# Patient Record
Sex: Male | Born: 1955
Health system: Southern US, Community
[De-identification: ages and names within clinical notes are randomized; demographics above are authoritative.]

## PROBLEM LIST (undated history)

## (undated) DIAGNOSIS — M1990 Unspecified osteoarthritis, unspecified site: Secondary | ICD-10-CM

## (undated) DIAGNOSIS — E291 Testicular hypofunction: Secondary | ICD-10-CM

## (undated) DIAGNOSIS — K219 Gastro-esophageal reflux disease without esophagitis: Secondary | ICD-10-CM

## (undated) DIAGNOSIS — E119 Type 2 diabetes mellitus without complications: Secondary | ICD-10-CM

## (undated) DIAGNOSIS — T4145XA Adverse effect of unspecified anesthetic, initial encounter: Secondary | ICD-10-CM

## (undated) DIAGNOSIS — R7303 Prediabetes: Secondary | ICD-10-CM

## (undated) DIAGNOSIS — I10 Essential (primary) hypertension: Secondary | ICD-10-CM

## (undated) DIAGNOSIS — F329 Major depressive disorder, single episode, unspecified: Secondary | ICD-10-CM

## (undated) DIAGNOSIS — E039 Hypothyroidism, unspecified: Secondary | ICD-10-CM

## (undated) DIAGNOSIS — J449 Chronic obstructive pulmonary disease, unspecified: Secondary | ICD-10-CM

## (undated) DIAGNOSIS — R51 Headache: Secondary | ICD-10-CM

## (undated) DIAGNOSIS — E785 Hyperlipidemia, unspecified: Secondary | ICD-10-CM

## (undated) DIAGNOSIS — K602 Anal fissure, unspecified: Secondary | ICD-10-CM

## (undated) DIAGNOSIS — F32A Depression, unspecified: Secondary | ICD-10-CM

## (undated) DIAGNOSIS — H9313 Tinnitus, bilateral: Secondary | ICD-10-CM

## (undated) DIAGNOSIS — K589 Irritable bowel syndrome without diarrhea: Secondary | ICD-10-CM

## (undated) DIAGNOSIS — G4730 Sleep apnea, unspecified: Secondary | ICD-10-CM

## (undated) DIAGNOSIS — E079 Disorder of thyroid, unspecified: Secondary | ICD-10-CM

## (undated) DIAGNOSIS — T8859XA Other complications of anesthesia, initial encounter: Secondary | ICD-10-CM

## (undated) DIAGNOSIS — E559 Vitamin D deficiency, unspecified: Secondary | ICD-10-CM

## (undated) HISTORY — PX: COLONOSCOPY: SHX174

## (undated) HISTORY — PX: KNEE SURGERY: SHX244

## (undated) HISTORY — PX: MOUTH SURGERY: SHX715

## (undated) HISTORY — DX: Essential (primary) hypertension: I10

## (undated) HISTORY — DX: Chronic obstructive pulmonary disease, unspecified: J44.9

## (undated) HISTORY — DX: Testicular hypofunction: E29.1

## (undated) HISTORY — DX: Vitamin D deficiency, unspecified: E55.9

## (undated) HISTORY — DX: Prediabetes: R73.03

## (undated) HISTORY — DX: Irritable bowel syndrome, unspecified: K58.9

## (undated) HISTORY — DX: Anal fissure, unspecified: K60.2

## (undated) HISTORY — PX: UPPER GI ENDOSCOPY: SHX6162

## (undated) HISTORY — DX: Hyperlipidemia, unspecified: E78.5

---

## 2004-07-05 DIAGNOSIS — G4730 Sleep apnea, unspecified: Secondary | ICD-10-CM

## 2004-07-05 HISTORY — DX: Sleep apnea, unspecified: G47.30

## 2005-05-28 ENCOUNTER — Ambulatory Visit (HOSPITAL_BASED_OUTPATIENT_CLINIC_OR_DEPARTMENT_OTHER): Admission: RE | Admit: 2005-05-28 | Discharge: 2005-05-28 | Payer: Self-pay | Admitting: Family Medicine

## 2005-06-06 ENCOUNTER — Ambulatory Visit: Payer: Self-pay | Admitting: Internal Medicine

## 2008-12-12 ENCOUNTER — Ambulatory Visit: Payer: Self-pay

## 2008-12-12 ENCOUNTER — Encounter: Payer: Self-pay | Admitting: Cardiovascular Disease

## 2009-07-31 ENCOUNTER — Ambulatory Visit (HOSPITAL_COMMUNITY): Admission: RE | Admit: 2009-07-31 | Discharge: 2009-07-31 | Payer: Self-pay | Admitting: Internal Medicine

## 2009-08-13 ENCOUNTER — Ambulatory Visit (HOSPITAL_COMMUNITY): Admission: RE | Admit: 2009-08-13 | Discharge: 2009-08-13 | Payer: Self-pay | Admitting: Internal Medicine

## 2010-03-30 HISTORY — PX: CARDIAC CATHETERIZATION: SHX172

## 2010-11-20 NOTE — Procedures (Signed)
NAMEGUILLAUME, Omar [name]                ACCOUNT NO.:  [id number]   MEDICAL RECORD NO.:  [id number]          PATIENT TYPE:  OUT   LOCATION:  SLEEP CENTER                 FACILITY:  [facility]   PHYSICIAN:  Clinton D. Maple Hudson, M.D. DATE OF BIRTH:  [birth year]   DATE OF STUDY:  05/28/2005                              NOCTURNAL POLYSOMNOGRAM   REFERRING PHYSICIAN:  Dr. Dara Hoyer.   DATE OF STUDY:  May 28, 2005.   INDICATION FOR STUDY:  Hypersomnia with sleep apnea. Epworth sleepiness  score 9/24, BMI 29, weight 185 pounds.   SLEEP ARCHITECTURE:  Total sleep time 395 minutes with sleep efficiency 88%.  Stage I was 16%, stage II 54%, stages III and IV 15%, REM 14% of total sleep  time. Sleep latency 15 minutes, REM latency 219 minutes, awake after sleep  onset 40 minutes, arousal index 17.3. No bedtime medication recorded.   RESPIRATORY DATA:  Split study protocol. Apnea hypopnea index (AHI, RDI)  24.4 obstructive events per hour indicating moderate obstructive sleep  apnea/hypopnea syndrome before CPAP. This included 4 obstructive apneas and  55 hypopneas before CPAP. Events were positional with most recorded while  supine (AHI of 14.7). REM AHI 1.1 per hour. CPAP was successfully titrated  to 13 CWP, (AHI 2.4 per hour). A medium Respironics nasal ComfortGel mask  was used with a heated humidifier. Patient indicated he was not comfortable  with this particular design.   OXYGEN DATA:  Very loud snoring with oxygen desaturation to a nadir of 55%.  Mean oxygen saturation after CPAP control ranged 94-98% on room air.   CARDIAC DATA:  Sinus rhythm with occasional PVC.   MOVEMENT/PARASOMNIA:  A total of 120 limb jerks were recorded of which only  12 were associated with arousal or awakening for a periodic limb movement  with arousal index of 1.8 per hour which is probably insignificant.   IMPRESSION/RECOMMENDATION:  1.  Moderate obstructive sleep apnea/hypopnea syndrome, apnea hypopnea  index      24.4 per hour with positional events more frequent while supine. Snoring      was very loud with oxygen desaturation to 55% before continuous positive      airway pressure.  2.  Successful continuous positive airway pressure titration to 13 CWP,      (apnea hypopnea index 2.4 per hour). A      medium Respironics nasal ComfortGel mask was used with a heated      humidifier. Alternative mask styles should be offered.      Clinton D. Maple Hudson, M.D.  Diplomate, Biomedical engineer of Sleep Medicine  Electronically Signed     CDY/MEDQ  D:  06/06/2005 08:46:08  T:  06/06/2005 10:08:37  Job:  [id number]

## 2011-03-15 ENCOUNTER — Other Ambulatory Visit (HOSPITAL_COMMUNITY): Payer: Self-pay | Admitting: Internal Medicine

## 2011-03-15 ENCOUNTER — Ambulatory Visit (HOSPITAL_COMMUNITY)
Admission: RE | Admit: 2011-03-15 | Discharge: 2011-03-15 | Disposition: A | Discharge status: Home / Self Care | Payer: BC Managed Care – PPO | Source: Ambulatory Visit | Attending: Internal Medicine | Admitting: Internal Medicine

## 2011-03-15 DIAGNOSIS — R635 Abnormal weight gain: Secondary | ICD-10-CM | POA: Insufficient documentation

## 2011-03-15 DIAGNOSIS — R5381 Other malaise: Secondary | ICD-10-CM | POA: Insufficient documentation

## 2011-03-15 DIAGNOSIS — F17200 Nicotine dependence, unspecified, uncomplicated: Secondary | ICD-10-CM | POA: Insufficient documentation

## 2011-03-15 DIAGNOSIS — R079 Chest pain, unspecified: Secondary | ICD-10-CM | POA: Insufficient documentation

## 2011-07-12 ENCOUNTER — Other Ambulatory Visit: Payer: Self-pay | Admitting: Internal Medicine

## 2011-07-12 DIAGNOSIS — IMO0001 Reserved for inherently not codable concepts without codable children: Secondary | ICD-10-CM

## 2011-07-14 ENCOUNTER — Ambulatory Visit
Admission: RE | Admit: 2011-07-14 | Discharge: 2011-07-14 | Disposition: A | Discharge status: Home / Self Care | Payer: BC Managed Care – PPO | Source: Ambulatory Visit | Attending: Internal Medicine | Admitting: Internal Medicine

## 2011-07-14 DIAGNOSIS — IMO0001 Reserved for inherently not codable concepts without codable children: Secondary | ICD-10-CM

## 2011-09-30 ENCOUNTER — Ambulatory Visit (HOSPITAL_COMMUNITY)
Admission: RE | Admit: 2011-09-30 | Discharge: 2011-09-30 | Disposition: A | Discharge status: Home / Self Care | Payer: BC Managed Care – PPO | Source: Ambulatory Visit | Attending: Internal Medicine | Admitting: Internal Medicine

## 2011-09-30 ENCOUNTER — Other Ambulatory Visit (HOSPITAL_COMMUNITY): Payer: Self-pay | Admitting: Internal Medicine

## 2011-09-30 DIAGNOSIS — R079 Chest pain, unspecified: Secondary | ICD-10-CM | POA: Insufficient documentation

## 2011-09-30 DIAGNOSIS — R072 Precordial pain: Secondary | ICD-10-CM | POA: Insufficient documentation

## 2011-10-05 ENCOUNTER — Other Ambulatory Visit: Payer: Self-pay | Admitting: Internal Medicine

## 2011-10-05 DIAGNOSIS — R0600 Dyspnea, unspecified: Secondary | ICD-10-CM

## 2011-10-05 DIAGNOSIS — R0789 Other chest pain: Secondary | ICD-10-CM

## 2011-10-06 ENCOUNTER — Ambulatory Visit
Admission: RE | Admit: 2011-10-06 | Discharge: 2011-10-06 | Disposition: A | Discharge status: Home / Self Care | Payer: BC Managed Care – PPO | Source: Ambulatory Visit | Attending: Internal Medicine | Admitting: Internal Medicine

## 2011-10-06 DIAGNOSIS — R0600 Dyspnea, unspecified: Secondary | ICD-10-CM

## 2011-10-06 DIAGNOSIS — R0789 Other chest pain: Secondary | ICD-10-CM

## 2012-04-30 ENCOUNTER — Emergency Department (HOSPITAL_COMMUNITY): Payer: BC Managed Care – PPO

## 2012-04-30 ENCOUNTER — Emergency Department (HOSPITAL_COMMUNITY)
Admission: EM | Admit: 2012-04-30 | Discharge: 2012-04-30 | Disposition: A | Discharge status: Home / Self Care | Payer: BC Managed Care – PPO | Attending: Emergency Medicine | Admitting: Emergency Medicine

## 2012-04-30 ENCOUNTER — Encounter (HOSPITAL_COMMUNITY): Payer: Self-pay | Admitting: *Deleted

## 2012-04-30 DIAGNOSIS — Y939 Activity, unspecified: Secondary | ICD-10-CM | POA: Insufficient documentation

## 2012-04-30 DIAGNOSIS — Y929 Unspecified place or not applicable: Secondary | ICD-10-CM | POA: Insufficient documentation

## 2012-04-30 DIAGNOSIS — Z79899 Other long term (current) drug therapy: Secondary | ICD-10-CM | POA: Insufficient documentation

## 2012-04-30 DIAGNOSIS — S838X9A Sprain of other specified parts of unspecified knee, initial encounter: Secondary | ICD-10-CM | POA: Insufficient documentation

## 2012-04-30 DIAGNOSIS — E079 Disorder of thyroid, unspecified: Secondary | ICD-10-CM | POA: Insufficient documentation

## 2012-04-30 DIAGNOSIS — S8392XA Sprain of unspecified site of left knee, initial encounter: Secondary | ICD-10-CM

## 2012-04-30 DIAGNOSIS — M129 Arthropathy, unspecified: Secondary | ICD-10-CM | POA: Insufficient documentation

## 2012-04-30 DIAGNOSIS — S86819A Strain of other muscle(s) and tendon(s) at lower leg level, unspecified leg, initial encounter: Secondary | ICD-10-CM | POA: Insufficient documentation

## 2012-04-30 DIAGNOSIS — X500XXA Overexertion from strenuous movement or load, initial encounter: Secondary | ICD-10-CM | POA: Insufficient documentation

## 2012-04-30 DIAGNOSIS — F17200 Nicotine dependence, unspecified, uncomplicated: Secondary | ICD-10-CM | POA: Insufficient documentation

## 2012-04-30 HISTORY — DX: Disorder of thyroid, unspecified: E07.9

## 2012-04-30 HISTORY — DX: Unspecified osteoarthritis, unspecified site: M19.90

## 2012-04-30 MED ORDER — OXYCODONE-ACETAMINOPHEN 5-325 MG PO TABS: 2.0000 | ORAL_TABLET | ORAL | Status: DC | PRN

## 2012-04-30 MED ORDER — OXYCODONE-ACETAMINOPHEN 5-325 MG PO TABS
2.0000 | ORAL_TABLET | Freq: Once | ORAL | Status: AC
  Administered 2012-04-30: 2 via ORAL
  Filled 2012-04-30: qty 2

## 2012-04-30 NOTE — ED Provider Notes (Signed)
Medical screening examination/treatment/procedure(s) were performed by non-physician practitioner and as supervising physician I was immediately available for consultation/collaboration.  Niani Mourer T Emmalene Kattner, MD 04/30/12 2239 

## 2012-04-30 NOTE — ED Notes (Signed)
Patient transported from X-ray 

## 2012-04-30 NOTE — ED Notes (Signed)
Pt states that he is preparing for knee replacement and has chronic knee pain. However, this afternoon he feels he twisted in a strange way, but it caused immediate pain to L knee that is different from the chronic pain he usually feels.

## 2012-04-30 NOTE — ED Notes (Addendum)
PA at bedside.

## 2012-04-30 NOTE — ED Notes (Signed)
Patient transported to X-ray 

## 2012-04-30 NOTE — ED Provider Notes (Signed)
History     CSN: [id number]  Arrival date & time 04/30/12  1559   First MD Initiated Contact with Patient 04/30/12 1651      Chief Complaint  Patient presents with  . Knee Pain    (Consider location/radiation/quality/duration/timing/severity/associated sxs/prior treatment) HPI Comments: This is a 56 year old male, who presents to the emergency department with the chief complaint of knee pain. I believe the knee pain to be a acute on chronic. The patient states that the pain began to worsen earlier today after he twisted his leg, and then felt excruciating pain on the medial aspect of his left knee. The patient is scheduled to have orthopedic workup for knee replacement on December 6th. The patient states that he is using a fentanyl patch for pain, but that is no longer helping. Patient states that the pain receives with rest, and is worsened with movement. There is no radiating symptoms.  The history is provided by the patient. No language interpreter was used.    Past Medical History  Diagnosis Date  . Thyroid disease   . Arthritis     Past Surgical History  Procedure Date  . Knee surgery     Family History  Problem Relation Age of Onset  . Cancer Mother     breast  . Hyperlipidemia Mother   . Hypertension Mother   . Cancer Father     pancreatic    History  Substance Use Topics  . Smoking status: Current Every Day Smoker -- 1.0 packs/day for 40 years    Types: Cigarettes  . Smokeless tobacco: Never Used  . Alcohol Use: 0.5 oz/week    1 drink(s) per week     rarely      Review of Systems  Musculoskeletal:       Left knee pain  All other systems reviewed and are negative.    Allergies  Review of patient's allergies indicates no known allergies.  Home Medications  No current outpatient prescriptions on file.  BP 127/77  Pulse 98  Temp 98.7 F (37.1 C) (Oral)  Resp 14  SpO2 96%  Physical Exam  Nursing note and vitals reviewed. Constitutional:  He is oriented to person, place, and time. He appears well-developed and well-nourished.  HENT:  Head: Normocephalic and atraumatic.  Eyes: Conjunctivae normal and EOM are normal. Pupils are equal, round, and reactive to light.  Neck: Normal range of motion. Neck supple.  Cardiovascular: Normal rate, regular rhythm and normal heart sounds.   Pulmonary/Chest: Effort normal and breath sounds normal.  Abdominal: Soft. Bowel sounds are normal.  Musculoskeletal: He exhibits tenderness. He exhibits no edema.       Left knee very tender over her medial aspect. Range of motion and strength limited secondary to pain.  Neurological: He is alert and oriented to person, place, and time.  Skin: Skin is warm and dry.  Psychiatric: He has a normal mood and affect. His behavior is normal. Judgment and thought content normal.    ED Course  Procedures (including critical care time)     Dg Knee Complete 4 Views Left  04/30/2012  *RADIOLOGY REPORT*  Clinical Data: 56 year old male with left knee pain following injury.  LEFT KNEE - COMPLETE 4+ VIEW  Comparison: None  Findings: No evidence of acute fracture, subluxation or dislocation identified.  No joint effusion noted.  No radio-opaque foreign bodies are present.  No focal bony lesions are noted.  The joint spaces are unremarkable except for mild medial compartment joint  space narrowing.  IMPRESSION: No evidence of acute abnormality.   Original Report Authenticated By: Rosendo Gros, M.D.       1. Left knee sprain       MDM   56 year old male with left knee sprain. The patient has his own crutches, and knee brace. No acute process seen on knee films. I am going to discharge the patient to home with instructions to followup with his primary care provider. The patient is stable and ready for discharge. The patient is agreeable with this plan. I'm going to discharge the patient with a few Percocet for pain.       Roxy Horseman, PA-C 04/30/12  1810

## 2012-05-03 ENCOUNTER — Other Ambulatory Visit: Payer: Self-pay | Admitting: Internal Medicine

## 2012-05-03 DIAGNOSIS — M25562 Pain in left knee: Secondary | ICD-10-CM

## 2012-05-04 ENCOUNTER — Ambulatory Visit
Admission: RE | Admit: 2012-05-04 | Discharge: 2012-05-04 | Disposition: A | Discharge status: Home / Self Care | Payer: BC Managed Care – PPO | Source: Ambulatory Visit | Attending: Internal Medicine | Admitting: Internal Medicine

## 2012-05-04 DIAGNOSIS — M25562 Pain in left knee: Secondary | ICD-10-CM

## 2012-05-05 ENCOUNTER — Other Ambulatory Visit: Payer: BC Managed Care – PPO

## 2012-05-09 ENCOUNTER — Encounter (HOSPITAL_COMMUNITY): Payer: Self-pay | Admitting: Emergency Medicine

## 2012-05-09 ENCOUNTER — Emergency Department (HOSPITAL_COMMUNITY)
Admission: EM | Admit: 2012-05-09 | Discharge: 2012-05-10 | Disposition: A | Discharge status: Home / Self Care | Payer: BC Managed Care – PPO | Attending: Emergency Medicine | Admitting: Emergency Medicine

## 2012-05-09 ENCOUNTER — Emergency Department (HOSPITAL_COMMUNITY): Payer: BC Managed Care – PPO

## 2012-05-09 DIAGNOSIS — R109 Unspecified abdominal pain: Secondary | ICD-10-CM | POA: Insufficient documentation

## 2012-05-09 DIAGNOSIS — Z79899 Other long term (current) drug therapy: Secondary | ICD-10-CM | POA: Insufficient documentation

## 2012-05-09 DIAGNOSIS — R110 Nausea: Secondary | ICD-10-CM

## 2012-05-09 DIAGNOSIS — Z8639 Personal history of other endocrine, nutritional and metabolic disease: Secondary | ICD-10-CM | POA: Insufficient documentation

## 2012-05-09 DIAGNOSIS — R112 Nausea with vomiting, unspecified: Secondary | ICD-10-CM | POA: Insufficient documentation

## 2012-05-09 DIAGNOSIS — Z862 Personal history of diseases of the blood and blood-forming organs and certain disorders involving the immune mechanism: Secondary | ICD-10-CM | POA: Insufficient documentation

## 2012-05-09 DIAGNOSIS — Z8739 Personal history of other diseases of the musculoskeletal system and connective tissue: Secondary | ICD-10-CM | POA: Insufficient documentation

## 2012-05-09 DIAGNOSIS — F17200 Nicotine dependence, unspecified, uncomplicated: Secondary | ICD-10-CM | POA: Insufficient documentation

## 2012-05-09 LAB — COMPREHENSIVE METABOLIC PANEL
ALT: 24 U/L (ref 0–53)
AST: 21 U/L (ref 0–37)
Albumin: 3.9 g/dL (ref 3.5–5.2)
Alkaline Phosphatase: 51 U/L (ref 39–117)
BUN: 10 mg/dL (ref 6–23)
CO2: 27 mEq/L (ref 19–32)
Calcium: 9.3 mg/dL (ref 8.4–10.5)
Chloride: 97 mEq/L (ref 96–112)
Creatinine, Ser: 0.89 mg/dL (ref 0.50–1.35)
GFR calc Af Amer: >90 mL/min (ref >90)
GFR calc non Af Amer: >90 mL/min (ref >90)
Glucose, Bld: 141 mg/dL — ABNORMAL HIGH (ref 70–99)
Potassium: 3.9 mEq/L (ref 3.5–5.1)
Sodium: 135 mEq/L (ref 135–145)
Total Bilirubin: 0.4 mg/dL (ref 0.3–1.2)
Total Protein: 7.1 g/dL (ref 6.0–8.3)

## 2012-05-09 LAB — CBC WITH DIFFERENTIAL/PLATELET
Basophils Absolute: 0 10*3/uL (ref 0.0–0.1)
Basophils Relative: 0 % (ref 0–1)
Eosinophils Absolute: 0.2 10*3/uL (ref 0.0–0.7)
Eosinophils Relative: 2 % (ref 0–5)
HCT: 54.7 % — ABNORMAL HIGH (ref 39.0–52.0)
Hemoglobin: 18.8 g/dL — ABNORMAL HIGH (ref 13.0–17.0)
Lymphocytes Relative: 23 % (ref 12–46)
Lymphs Abs: 2.5 10*3/uL (ref 0.7–4.0)
MCH: 30.4 pg (ref 26.0–34.0)
MCHC: 34.4 g/dL (ref 30.0–36.0)
MCV: 88.4 fL (ref 78.0–100.0)
Monocytes Absolute: 0.6 10*3/uL (ref 0.1–1.0)
Monocytes Relative: 6 % (ref 3–12)
Neutro Abs: 7.4 10*3/uL (ref 1.7–7.7)
Neutrophils Relative %: 69 % (ref 43–77)
Platelets: 209 10*3/uL (ref 150–400)
RBC: 6.19 MIL/uL — ABNORMAL HIGH (ref 4.22–5.81)
RDW: 14.8 % (ref 11.5–15.5)
WBC: 10.7 10*3/uL — ABNORMAL HIGH (ref 4.0–10.5)

## 2012-05-09 LAB — URINALYSIS, MICROSCOPIC ONLY
Bilirubin Urine: NEGATIVE
Glucose, UA: 100 mg/dL — AB
Hgb urine dipstick: NEGATIVE
Leukocytes, UA: NEGATIVE
Nitrite: NEGATIVE
Protein, ur: NEGATIVE mg/dL
Specific Gravity, Urine: 1.018 (ref 1.005–1.030)
Urobilinogen, UA: 0.2 mg/dL (ref 0.0–1.0)
pH: 7.5 (ref 5.0–8.0)

## 2012-05-09 LAB — LIPASE, BLOOD: Lipase: 56 U/L (ref 11–59)

## 2012-05-09 MED ORDER — HYDROMORPHONE HCL PF 1 MG/ML IJ SOLN
1.0000 mg | Freq: Once | INTRAMUSCULAR | Status: AC
  Administered 2012-05-09: 1 mg via INTRAVENOUS
  Filled 2012-05-09: qty 1

## 2012-05-09 MED ORDER — SODIUM CHLORIDE 0.9 % IV BOLUS (SEPSIS)
1000.0000 mL | Freq: Once | INTRAVENOUS | Status: AC
  Administered 2012-05-09: 1000 mL via INTRAVENOUS

## 2012-05-09 MED ORDER — LORAZEPAM 1 MG PO TABS: 1.0000 mg | ORAL_TABLET | Freq: Three times a day (TID) | ORAL | Status: DC | PRN

## 2012-05-09 MED ORDER — IOHEXOL 300 MG/ML  SOLN
100.0000 mL | Freq: Once | INTRAMUSCULAR | Status: AC | PRN
  Administered 2012-05-09: 100 mL via INTRAVENOUS

## 2012-05-09 MED ORDER — ONDANSETRON HCL 4 MG/2ML IJ SOLN
4.0000 mg | Freq: Once | INTRAMUSCULAR | Status: AC
  Administered 2012-05-09: 4 mg via INTRAVENOUS
  Filled 2012-05-09: qty 2

## 2012-05-09 MED ORDER — LORAZEPAM 2 MG/ML IJ SOLN
1.0000 mg | Freq: Once | INTRAMUSCULAR | Status: AC
  Administered 2012-05-09: 1 mg via INTRAVENOUS
  Filled 2012-05-09: qty 1

## 2012-05-09 NOTE — ED Notes (Signed)
Pt alert arrives from home, with PCP, states acute nausea and emesis for 24 hrs, probable diverticulitis, resp even unlabored, skin pwd

## 2012-05-09 NOTE — ED Notes (Signed)
Bed:WA17<BR> Expected date:<BR> Expected time:<BR> Means of arrival:<BR> Comments:<BR> Triage 2

## 2012-05-09 NOTE — ED Notes (Signed)
CT notified pt completed CM 

## 2012-05-09 NOTE — ED Provider Notes (Addendum)
History     CSN: [id number]  Arrival date & time 05/09/12  2036   First MD Initiated Contact with Patient 05/09/12 2104      Chief Complaint  Patient presents with  . Abdominal Pain    (Consider location/radiation/quality/duration/timing/severity/associated sxs/prior treatment) HPI Comments: Abdominal pain and emesis for the past 24 hours Has not taken any OTC medications PTA   The history is provided by the patient.    Past Medical History  Diagnosis Date  . Thyroid disease   . Arthritis     Past Surgical History  Procedure Date  . Knee surgery     Family History  Problem Relation Age of Onset  . Cancer Mother     breast  . Hyperlipidemia Mother   . Hypertension Mother   . Cancer Father     pancreatic    History  Substance Use Topics  . Smoking status: Current Every Day Smoker -- 1.0 packs/day for 40 years    Types: Cigarettes  . Smokeless tobacco: Never Used  . Alcohol Use: 0.5 oz/week    1 drink(s) per week     Comment: rarely      Review of Systems  Constitutional: Negative for fever and chills.  HENT: Negative.   Eyes: Negative.   Respiratory: Negative for shortness of breath.   Cardiovascular: Negative.   Gastrointestinal: Positive for nausea, vomiting and abdominal pain. Negative for diarrhea and constipation.  Musculoskeletal: Negative for myalgias.  Skin: Negative for rash and wound.  Neurological: Negative for dizziness and headaches.    Allergies  Review of patient's allergies indicates no known allergies.  Home Medications   Current Outpatient Rx  Name  Route  Sig  Dispense  Refill  . BUPROPION HCL ER (SR) 150 MG PO TB12   Oral   Take 150 mg by mouth 2 (two) times daily.         Marland Kitchen ESOMEPRAZOLE MAGNESIUM 40 MG PO CPDR   Oral   Take 40 mg by mouth 2 (two) times daily.         Marland Kitchen EZETIMIBE-SIMVASTATIN 10-40 MG PO TABS   Oral   Take 1 tablet by mouth at bedtime.         . FENTANYL 25 MCG/HR TD PT72   Transdermal  Place 1 patch onto the skin every 3 (three) days. Last applied 04/28/2012         . OMEGA-3 FATTY ACIDS 1000 MG PO CAPS   Oral   Take 2 g by mouth daily.         Marland Kitchen LEVOTHYROXINE SODIUM 150 MCG PO TABS   Oral   Take 150 mcg by mouth daily.         Marland Kitchen LORATADINE-PSEUDOEPHEDRINE ER 5-120 MG PO TB12   Oral   Take 1 tablet by mouth 2 (two) times daily.         . ADULT MULTIVITAMIN W/MINERALS CH   Oral   Take 1 tablet by mouth daily.         . OXYCODONE-ACETAMINOPHEN 5-325 MG PO TABS   Oral   Take 2 tablets by mouth every 4 (four) hours as needed for pain.   6 tablet   0   . LORAZEPAM 1 MG PO TABS   Oral   Take 1 tablet (1 mg total) by mouth every 8 (eight) hours as needed for anxiety (for use if still nauseated after Zofran use ).   10 tablet   0  BP 147/93  Pulse 85  Temp 97 F (36.1 C)  Resp 16  Wt 190 lb (86.183 kg)  SpO2 97%  Physical Exam  Nursing note and vitals reviewed. Constitutional: He is oriented to person, place, and time. He appears well-developed and well-nourished.  HENT:  Head: Normocephalic.  Eyes: Pupils are equal, round, and reactive to light.  Neck: Normal range of motion.  Cardiovascular: Normal rate and regular rhythm.   Pulmonary/Chest: Effort normal and breath sounds normal.  Abdominal: Soft. Bowel sounds are normal. There is tenderness in the epigastric area.    Musculoskeletal: Normal range of motion.  Neurological: He is alert and oriented to person, place, and time.  Skin: Skin is warm.    ED Course  Procedures (including critical care time)  Labs Reviewed  CBC WITH DIFFERENTIAL - Abnormal; Notable for the following:    WBC 10.7 (*)     RBC 6.19 (*)     Hemoglobin 18.8 (*)     HCT 54.7 (*)     All other components within normal limits  COMPREHENSIVE METABOLIC PANEL - Abnormal; Notable for the following:    Glucose, Bld 141 (*)     All other components within normal limits  URINALYSIS, MICROSCOPIC ONLY -  Abnormal; Notable for the following:    Glucose, UA 100 (*)     Ketones, ur TRACE (*)     All other components within normal limits  LIPASE, BLOOD   Ct Abdomen Pelvis W Contrast  05/09/2012  *RADIOLOGY REPORT*  Clinical Data: Acute onset nausea and vomiting yesterday.  CT ABDOMEN AND PELVIS WITH CONTRAST  Technique:  Multidetector CT imaging of the abdomen and pelvis was performed following the standard protocol during bolus administration of intravenous contrast.  Contrast: OMNIPAQUE IOHEXOL 300 MG/ML. Oral contrast also administered.  Comparison: Abdominal ultrasound 07/14/2011.  No prior CT.  Findings: Very small hiatal hernia.  Stomach otherwise unremarkable apart from the moderately distended with the oral contrast material.  Normal-appearing small bowel.  Colon relatively decompressed and normal in appearance.  Normal appendix in the right upper pelvis.  No ascites.  Diffuse hepatic steatosis without focal hepatic parenchymal abnormality.  Normal appearing spleen, pancreas, adrenal glands, and kidneys.  Gallbladder unremarkable by CT.  No biliary ductal dilation.  Minimal right common iliac artery atherosclerosis.  No significant lymphadenopathy.  Urinary bladder unremarkable.  Moderate median lobe prostate gland enlargement for age, with a vague enhancement of the median lobe relative to the remainder of the gland.  Normal seminal vesicles.  Visualized lung bases clear apart from the expected dependent atelectasis posteriorly in the lower lobes.  Bone window images demonstrate degenerative changes involving the lower thoracic and lumbar spine and the right sacroiliac joint.  IMPRESSION:  1.  No acute abnormalities involving the abdomen or pelvis. 2.  Very small hiatal hernia.  Moderate gastric distention without evidence of gastric outlet obstruction; is there clinical evidence of gastroparesis? 3.  Moderate median lobe prostate gland enlargement, with vague enhancement of the median lobe  relative to the remainder of the gland.  This may just represent BPH.  Please correlate with PSA.   Original Report Authenticated By: Hulan Saas, M.D.      1. Abdominal pain   2. Nausea       MDM  Discussed lab findings and CT Scan results with patient and his personal physician that is at the bedside         Arman Filter, NP 05/09/12 2342  Arman Filter, NP 05/20/12 2043

## 2012-05-09 NOTE — ED Notes (Signed)
Patient unable to void at this time

## 2012-05-09 NOTE — ED Provider Notes (Signed)
Medical screening examination/treatment/procedure(s) were conducted as a shared visit with non-physician practitioner(s) and myself.  I personally evaluated the patient during the encounter  Primarily with epigastric ttp on my exam.  No concerning findings on CT.  Had [place] in January that was unremarkable.  Consider outpt gi follow up  Celene Kras, MD 05/09/12 [phone]

## 2012-05-11 ENCOUNTER — Encounter (HOSPITAL_BASED_OUTPATIENT_CLINIC_OR_DEPARTMENT_OTHER): Payer: Self-pay | Admitting: *Deleted

## 2012-05-11 ENCOUNTER — Other Ambulatory Visit: Payer: Self-pay | Admitting: Orthopedic Surgery

## 2012-05-11 NOTE — Progress Notes (Signed)
Denies any cardiac or resp problems-does have sleep apnea-uses a cpap-was rto have knee done [facility],but got food poisoning-was r/s here To bring cpap and crutches

## 2012-05-12 ENCOUNTER — Encounter (HOSPITAL_BASED_OUTPATIENT_CLINIC_OR_DEPARTMENT_OTHER)
Admission: RE | Disposition: A | Discharge status: Home / Self Care | Payer: Self-pay | Source: Ambulatory Visit | Attending: Orthopedic Surgery

## 2012-05-12 ENCOUNTER — Ambulatory Visit (HOSPITAL_BASED_OUTPATIENT_CLINIC_OR_DEPARTMENT_OTHER): Payer: BC Managed Care – PPO | Admitting: Anesthesiology

## 2012-05-12 ENCOUNTER — Encounter (HOSPITAL_BASED_OUTPATIENT_CLINIC_OR_DEPARTMENT_OTHER): Payer: Self-pay | Admitting: Anesthesiology

## 2012-05-12 ENCOUNTER — Encounter (HOSPITAL_BASED_OUTPATIENT_CLINIC_OR_DEPARTMENT_OTHER): Payer: Self-pay | Admitting: *Deleted

## 2012-05-12 ENCOUNTER — Ambulatory Visit (HOSPITAL_BASED_OUTPATIENT_CLINIC_OR_DEPARTMENT_OTHER)
Admission: RE | Admit: 2012-05-12 | Discharge: 2012-05-12 | Disposition: A | Discharge status: Home / Self Care | Payer: BC Managed Care – PPO | Source: Ambulatory Visit | Attending: Orthopedic Surgery | Admitting: Orthopedic Surgery

## 2012-05-12 DIAGNOSIS — S83289A Other tear of lateral meniscus, current injury, unspecified knee, initial encounter: Secondary | ICD-10-CM | POA: Insufficient documentation

## 2012-05-12 DIAGNOSIS — M2240 Chondromalacia patellae, unspecified knee: Secondary | ICD-10-CM | POA: Insufficient documentation

## 2012-05-12 DIAGNOSIS — M6750 Plica syndrome, unspecified knee: Secondary | ICD-10-CM | POA: Insufficient documentation

## 2012-05-12 DIAGNOSIS — X500XXA Overexertion from strenuous movement or load, initial encounter: Secondary | ICD-10-CM | POA: Insufficient documentation

## 2012-05-12 DIAGNOSIS — IMO0002 Reserved for concepts with insufficient information to code with codable children: Secondary | ICD-10-CM | POA: Insufficient documentation

## 2012-05-12 DIAGNOSIS — M2340 Loose body in knee, unspecified knee: Secondary | ICD-10-CM | POA: Insufficient documentation

## 2012-05-12 HISTORY — DX: Unspecified osteoarthritis, unspecified site: M19.90

## 2012-05-12 HISTORY — DX: Sleep apnea, unspecified: G47.30

## 2012-05-12 HISTORY — DX: Depression, unspecified: F32.A

## 2012-05-12 HISTORY — DX: Major depressive disorder, single episode, unspecified: F32.9

## 2012-05-12 HISTORY — PX: KNEE ARTHROSCOPY: SHX127

## 2012-05-12 HISTORY — DX: Gastro-esophageal reflux disease without esophagitis: K21.9

## 2012-05-12 LAB — POCT HEMOGLOBIN-HEMACUE: Hemoglobin: 18.3 g/dL — ABNORMAL HIGH (ref 13.0–17.0)

## 2012-05-12 SURGERY — ARTHROSCOPY, KNEE
Anesthesia: General | Site: Knee | Laterality: Left | Wound class: Clean

## 2012-05-12 MED ORDER — FENTANYL CITRATE 0.05 MG/ML IJ SOLN
INTRAMUSCULAR | Status: DC | PRN
  Administered 2012-05-12: 100 ug via INTRAVENOUS
  Administered 2012-05-12 (×2): 25 ug via INTRAVENOUS

## 2012-05-12 MED ORDER — LIDOCAINE HCL (CARDIAC) 20 MG/ML IV SOLN
INTRAVENOUS | Status: DC | PRN
  Administered 2012-05-12: 74 mg via INTRAVENOUS

## 2012-05-12 MED ORDER — PROPOFOL 10 MG/ML IV BOLUS
INTRAVENOUS | Status: DC | PRN
  Administered 2012-05-12: 150 mg via INTRAVENOUS

## 2012-05-12 MED ORDER — ONDANSETRON HCL 4 MG/2ML IJ SOLN
INTRAMUSCULAR | Status: DC | PRN
  Administered 2012-05-12: 4 mg via INTRAVENOUS

## 2012-05-12 MED ORDER — DEXAMETHASONE SODIUM PHOSPHATE 4 MG/ML IJ SOLN
INTRAMUSCULAR | Status: DC | PRN
  Administered 2012-05-12: 8 mg via INTRAVENOUS

## 2012-05-12 MED ORDER — POVIDONE-IODINE 7.5 % EX SOLN
Freq: Once | CUTANEOUS | Status: AC
  Administered 2012-05-12: 13:00:00 via TOPICAL

## 2012-05-12 MED ORDER — OXYCODONE-ACETAMINOPHEN 5-325 MG PO TABS: 1.0000 | ORAL_TABLET | Freq: Four times a day (QID) | ORAL | Status: DC | PRN

## 2012-05-12 MED ORDER — CEFAZOLIN SODIUM-DEXTROSE 2-3 GM-% IV SOLR
2.0000 g | INTRAVENOUS | Status: AC
  Administered 2012-05-12: 2 g via INTRAVENOUS

## 2012-05-12 MED ORDER — BUPIVACAINE HCL (PF) 0.5 % IJ SOLN
INTRAMUSCULAR | Status: DC | PRN
  Administered 2012-05-12: 20 mL

## 2012-05-12 MED ORDER — LACTATED RINGERS IV SOLN
INTRAVENOUS | Status: DC
  Administered 2012-05-12: 13:00:00 via INTRAVENOUS

## 2012-05-12 MED ORDER — SODIUM CHLORIDE 0.9 % IR SOLN
Status: DC | PRN
  Administered 2012-05-12: 3000 mL

## 2012-05-12 MED ORDER — MIDAZOLAM HCL 5 MG/5ML IJ SOLN
INTRAMUSCULAR | Status: DC | PRN
  Administered 2012-05-12: 2 mg via INTRAVENOUS

## 2012-05-12 MED ORDER — EPINEPHRINE HCL 1 MG/ML IJ SOLN
INTRAMUSCULAR | Status: DC | PRN
  Administered 2012-05-12: 1 mg via SUBCUTANEOUS

## 2012-05-12 SURGICAL SUPPLY — 42 items
BANDAGE ELASTIC 6 VELCRO ST LF (GAUZE/BANDAGES/DRESSINGS) ×2 IMPLANT
BLADE 4.2CUDA (BLADE) IMPLANT
BLADE GREAT WHITE 4.2 (BLADE) ×2 IMPLANT
CANISTER OMNI JUG 16 LITER (MISCELLANEOUS) ×2 IMPLANT
CANISTER SUCTION 2500CC (MISCELLANEOUS) IMPLANT
CLOTH BEACON ORANGE TIMEOUT ST (SAFETY) ×2 IMPLANT
CUTTER MENISCUS  4.2MM (BLADE)
CUTTER MENISCUS 4.2MM (BLADE) IMPLANT
DRAPE ARTHROSCOPY W/POUCH 114 (DRAPES) ×2 IMPLANT
DRSG EMULSION OIL 3X3 NADH (GAUZE/BANDAGES/DRESSINGS) ×2 IMPLANT
DURAPREP 26ML APPLICATOR (WOUND CARE) ×2 IMPLANT
ELECT MENISCUS 165MM 90D (ELECTRODE) IMPLANT
ELECT REM PT RETURN 9FT ADLT (ELECTROSURGICAL)
ELECTRODE REM PT RTRN 9FT ADLT (ELECTROSURGICAL) IMPLANT
GLOVE BIO SURGEON STRL SZ 6.5 (GLOVE) ×2 IMPLANT
GLOVE BIO SURGEON STRL SZ7 (GLOVE) ×2 IMPLANT
GLOVE BIO SURGEON STRL SZ7.5 (GLOVE) IMPLANT
GLOVE BIOGEL PI IND STRL 7.0 (GLOVE) ×1 IMPLANT
GLOVE BIOGEL PI IND STRL 8 (GLOVE) ×2 IMPLANT
GLOVE BIOGEL PI INDICATOR 7.0 (GLOVE) ×1
GLOVE BIOGEL PI INDICATOR 8 (GLOVE) ×2
GLOVE ECLIPSE 7.5 STRL STRAW (GLOVE) ×4 IMPLANT
GOWN BRE IMP PREV XXLGXLNG (GOWN DISPOSABLE) ×2 IMPLANT
GOWN PREVENTION PLUS XLARGE (GOWN DISPOSABLE) ×2 IMPLANT
GOWN PREVENTION PLUS XXLARGE (GOWN DISPOSABLE) ×6 IMPLANT
HOLDER KNEE FOAM BLUE (MISCELLANEOUS) ×2 IMPLANT
KNEE WRAP E Z 3 GEL PACK (MISCELLANEOUS) ×2 IMPLANT
NDL SAFETY ECLIPSE 18X1.5 (NEEDLE) ×1 IMPLANT
NEEDLE HYPO 18GX1.5 SHARP (NEEDLE) ×1
PACK ARTHROSCOPY DSU (CUSTOM PROCEDURE TRAY) ×2 IMPLANT
PACK BASIN DAY SURGERY FS (CUSTOM PROCEDURE TRAY) ×2 IMPLANT
PAD CAST 4YDX4 CTTN HI CHSV (CAST SUPPLIES) ×1 IMPLANT
PADDING CAST COTTON 4X4 STRL (CAST SUPPLIES) ×2
PENCIL BUTTON HOLSTER BLD 10FT (ELECTRODE) IMPLANT
SET ARTHROSCOPY TUBING (MISCELLANEOUS) ×1
SET ARTHROSCOPY TUBING LN (MISCELLANEOUS) ×1 IMPLANT
SPONGE GAUZE 4X4 12PLY (GAUZE/BANDAGES/DRESSINGS) ×2 IMPLANT
SUT ETHILON 4 0 PS 2 18 (SUTURE) IMPLANT
SYR 5ML LL (SYRINGE) ×2 IMPLANT
TOWEL OR 17X24 6PK STRL BLUE (TOWEL DISPOSABLE) ×2 IMPLANT
TOWEL OR NON WOVEN STRL DISP B (DISPOSABLE) ×2 IMPLANT
WATER STERILE IRR 1000ML POUR (IV SOLUTION) IMPLANT

## 2012-05-12 NOTE — Anesthesia Preprocedure Evaluation (Signed)
Anesthesia Evaluation  Patient identified by MRN, date of birth, ID band Patient awake    Reviewed: Allergy & Precautions, H&P , NPO status , Patient's Chart, lab work & pertinent test results  Airway Mallampati: I TM Distance: >3 FB Neck ROM: Full    Dental   Pulmonary          Cardiovascular     Neuro/Psych    GI/Hepatic GERD-  Medicated,  Endo/Other    Renal/GU      Musculoskeletal   Abdominal   Peds  Hematology   Anesthesia Other Findings   Reproductive/Obstetrics                           Anesthesia Physical Anesthesia Plan  ASA: III  Anesthesia Plan: General   Post-op Pain Management:    Induction: Intravenous  Airway Management Planned: LMA  Additional Equipment:   Intra-op Plan:   Post-operative Plan: Extubation in OR  Informed Consent: I have reviewed the patients History and Physical, chart, labs and discussed the procedure including the risks, benefits and alternatives for the proposed anesthesia with the patient or authorized representative who has indicated his/her understanding and acceptance.     Plan Discussed with: CRNA and Surgeon  Anesthesia Plan Comments:         Anesthesia Quick Evaluation

## 2012-05-12 NOTE — Brief Op Note (Signed)
05/12/2012  4:08 PM  PATIENT:  Omar [name]  56 y.o. male  PRE-OPERATIVE DIAGNOSIS:  meniscus tear  POST-OPERATIVE DIAGNOSIS:  left knee partial medial tear partial lateral tear chondromalasia tibial plateau laterally  PROCEDURE:  Procedure(s) (LRB) with comments: ARTHROSCOPY KNEE (Left)  SURGEON:  Surgeon(s) and Role:    * Harvie Junior, MD - Primary  PHYSICIAN ASSISTANT:   ASSISTANTS: bethune   ANESTHESIA:   general  EBL:  Total I/O In: 922 [P.O.:222; I.V.:700] Out: -   BLOOD ADMINISTERED:none  DRAINS: none   LOCAL MEDICATIONS USED:  MARCAINE     SPECIMEN:  No Specimen  DISPOSITION OF SPECIMEN:  N/A  COUNTS:  YES  TOURNIQUET:  * No tourniquets in log *  DICTATION: .Other Dictation: Dictation Number [id number]  PLAN OF CARE: Discharge to home after PACU  PATIENT DISPOSITION:  PACU - hemodynamically stable.   Delay start of Pharmacological VTE agent (>24hrs) due to surgical blood loss or risk of bleeding: no

## 2012-05-12 NOTE — Anesthesia Postprocedure Evaluation (Signed)
Anesthesia Post Note  Patient: Omar [name]  Procedure(s) Performed: Procedure(s) (LRB): ARTHROSCOPY KNEE (Left)  Anesthesia type: general  Patient location: PACU  Post pain: Pain level controlled  Post assessment: Patient's Cardiovascular Status Stable  Last Vitals:  Filed Vitals:   05/12/12 1515  BP: 113/91  Pulse: 90  Temp: 36.4 C  Resp: 18    Post vital signs: Reviewed and stable  Level of consciousness: sedated  Complications: No apparent anesthesia complications

## 2012-05-12 NOTE — Transfer of Care (Signed)
Immediate Anesthesia Transfer of Care Note  Patient: Omar [name]  Procedure(s) Performed: Procedure(s) (LRB) with comments: ARTHROSCOPY KNEE (Left)  Patient Location: PACU  Anesthesia Type:General  Level of Consciousness: awake, alert  and oriented  Airway & Oxygen Therapy: Patient Spontanous Breathing and Patient connected to face mask oxygen  Post-op Assessment: Report given to PACU RN and Post -op Vital signs reviewed and stable  Post vital signs: Reviewed and stable  Complications: No apparent anesthesia complications

## 2012-05-12 NOTE — H&P (Signed)
PREOPERATIVE H&P  Chief Complaint: l knee pain  HPI: Omar [name] is a 56 y.o. male who presents for evaluation of l knee pain. It has been present for 3 months and has been worsening. He has failed conservative measures. Pain is rated as moderate.  Past Medical History  Diagnosis Date  . Thyroid disease   . Arthritis   . GERD (gastroesophageal reflux disease)   . DJD (degenerative joint disease)   . Sleep apnea 2006    uses a cpap -desated to 55% during study  . Depression    Past Surgical History  Procedure Date  . Knee surgery   . Upper gi endoscopy     x2  . Colonoscopy     x2  . Mouth surgery    History   Social History  . Marital Status: Married    Spouse Name: N/A    Number of Children: N/A  . Years of Education: N/A   Social History Main Topics  . Smoking status: Current Every Day Smoker -- 1.0 packs/day for 40 years    Types: Cigarettes  . Smokeless tobacco: Never Used  . Alcohol Use: 0.5 oz/week    1 drink(s) per week     Comment: rarely  . Drug Use: No  . Sexually Active:    Other Topics Concern  . None   Social History Narrative  . None   Family History  Problem Relation Age of Onset  . Cancer Mother     breast  . Hyperlipidemia Mother   . Hypertension Mother   . Cancer Father     pancreatic   No Known Allergies Prior to Admission medications   Medication Sig Start Date End Date Taking? Authorizing Provider  buPROPion (WELLBUTRIN SR) 150 MG 12 hr tablet Take 150 mg by mouth 2 (two) times daily.   Yes Historical Provider, MD  esomeprazole (NEXIUM) 40 MG capsule Take 40 mg by mouth 2 (two) times daily.   Yes Historical Provider, MD  ezetimibe-simvastatin (VYTORIN) 10-40 MG per tablet Take 1 tablet by mouth at bedtime.   Yes Historical Provider, MD  fentaNYL (DURAGESIC - DOSED MCG/HR) 25 MCG/HR Place 1 patch onto the skin every 3 (three) days. Last applied 04/28/2012   Yes Historical Provider, MD  fish oil-omega-3 fatty acids 1000 MG  capsule Take 2 g by mouth daily.   Yes Historical Provider, MD  levothyroxine (SYNTHROID, LEVOTHROID) 150 MCG tablet Take 150 mcg by mouth daily.   Yes Historical Provider, MD  loratadine-pseudoephedrine (CLARITIN-D 12-HOUR) 5-120 MG per tablet Take 1 tablet by mouth 2 (two) times daily.   Yes Historical Provider, MD  Multiple Vitamin (MULTIVITAMIN WITH MINERALS) TABS Take 1 tablet by mouth daily.   Yes Historical Provider, MD  oxyCODONE-acetaminophen (PERCOCET/ROXICET) 5-325 MG per tablet Take 2 tablets by mouth every 4 (four) hours as needed for pain. 04/30/12  Yes Roxy Horseman, PA-C  LORazepam (ATIVAN) 1 MG tablet Take 1 tablet (1 mg total) by mouth every 8 (eight) hours as needed for anxiety (for use if still nauseated after Zofran use ). 05/09/12   Arman Filter, NP     Positive ROS: none  All other systems have been reviewed and were otherwise negative with the exception of those mentioned in the HPI and as above.  Physical Exam: Filed Vitals:   05/12/12 1250  BP: 144/83  Pulse: 97  Temp: 98.2 F (36.8 C)  Resp: 18    General: Alert, no acute distress Cardiovascular: No pedal  edema Respiratory: No cyanosis, no use of accessory musculature GI: No organomegaly, abdomen is soft and non-tender Skin: No lesions in the area of chief complaint Neurologic: Sensation intact distally Psychiatric: Patient is competent for consent with normal mood and affect Lymphatic: No axillary or cervical lymphadenopathy  MUSCULOSKELETAL: l knee: +ttp over med jt line +[place] -instability  Assessment/Plan: MEDIAL MENISCUS TEAR LEFT KNEE Plan for Procedure(s): ARTHROSCOPY KNEE  The risks benefits and alternatives were discussed with the patient including but not limited to the risks of nonoperative treatment, versus surgical intervention including infection, bleeding, nerve injury, malunion, nonunion, hardware prominence, hardware failure, need for hardware removal, blood clots,  cardiopulmonary complications, morbidity, mortality, among others, and they were willing to proceed.  Predicted outcome is good, although there will be at least a six to nine month expected recovery.  Melek Pownall L, MD 05/12/2012 1:10 PM

## 2012-05-13 NOTE — Op Note (Signed)
NAME:  [name], Omar                ACCOUNT NO.:  [id number]  MEDICAL RECORD NO.:  [id number]  LOCATION:  WA17                         FACILITY:  [facility]  PHYSICIAN:  Nichole Neyer L. Charod Slawinski, M.D.   DATE OF BIRTH:  07/17/1955  DATE OF PROCEDURE:  05/12/2012 DATE OF DISCHARGE:  05/12/2012                              OPERATIVE REPORT   PREOPERATIVE DIAGNOSES: 1. Medial meniscal tear. 2. Osteocartilaginous loose body.  POSTOPERATIVE DIAGNOSES: 1. Medial meniscal tear. 2. Osteocartilaginous loose body. 3. Severe chondromalacia of the medial femoral condyle. 4. Large medial shelf plica. 5. Lateral meniscal tear.  PROCEDURES: 1. Partial medial and lateral meniscectomy with corresponding     debridement of medial and lateral compartments. 2. Removal of the osteocartilaginous loose body. 3. Debridement of large medial shelf plica.  SURGEON:  Eilee Schader L. Travante Knee, MD  ASSISTANT:  Abdulhamid Bethune, PA  ANESTHESIA:  General.  BRIEF HISTORY:  Mr. Sebastiano is a 55-year-old male with a long history of having significant degenerative changes in both of his knees.  He is awaiting [facility]. He recently had a twisting injury to his knee and has started having severe pain in the left knee.  We treated conservatively for a period of time, but after failure of conservative care, he was taken to the operating room for operative knee arthroscopy.  Preoperative MRI showed medial meniscal tear and lateral loose body.  He was brought to the operating room for evaluation.  PROCEDURE IN DETAIL:  The patient was brought to the operating room procedure and after adequate level of anesthesia was obtained with general anesthetic, the patient was placed supine on the operating table.  The left leg was then prepped and draped in usual sterile fashion.  Following this, routine arthroscopic examination of the knee revealed there was an obvious medial meniscal tear with an  undersurface flap.  This was debrided back to a smooth and stable rim.  Attention was turned to the notch where the ACL was normal.  Attention was turned laterally where there was a posterolateral meniscal tear and chondromalacia of the lateral tibial plateau which was debrided.  On the medial side, there was grade 4 change over a fairly large area of the medial femoral condyle.  The osteocartilaginous loose body was identified and removed.  Attention was then turned towards the patellofemoral joint where there was mild chondromalacia, and there was a large medial shelf plica which was debrided.  Once this was completed, the knee was copiously and thoroughly lavaged and suctioned dry.  Final check was made for any loose and fragmented pieces or other loose bodies.  Seeing none, the knee was again irrigated and suctioned dry.  All the sterile portals were closed with bandage.  A sterile compressive dressing was applied and the patient was taken to the recovery and was noted to be satisfactory condition.  Estimated blood loss for the procedure was none.     Amily Depp L. Avryl Roehm, M.D.     JLG/MEDQ  D:  05/12/2012  T:  05/13/2012  Job:  [id number] 

## 2012-05-13 NOTE — Op Note (Deleted)
Omar [name], Omar NO.:  [id number]  MEDICAL RECORD NO.:  [id number]  LOCATION:  WA17                         FACILITY:  [facility]  PHYSICIAN:  Harvie Junior, M.D.   DATE OF BIRTH:  1956-06-29  DATE OF PROCEDURE:  05/12/2012 DATE OF DISCHARGE:  05/12/2012                              OPERATIVE REPORT   PREOPERATIVE DIAGNOSES: 1. Medial meniscal tear. 2. Osteocartilaginous loose body.  POSTOPERATIVE DIAGNOSES: 1. Medial meniscal tear. 2. Osteocartilaginous loose body. 3. Severe chondromalacia of the medial femoral condyle. 4. Large medial shelf plica. 5. Lateral meniscal tear.  PROCEDURES: 1. Partial medial and lateral meniscectomy with corresponding     debridement of medial and lateral compartments. 2. Removal of the osteocartilaginous loose body. 3. Debridement of large medial shelf plica.  SURGEON:  Harvie Junior, MD  ASSISTANT:  Marshia Ly, PA  ANESTHESIA:  General.  BRIEF HISTORY:  Mr. Schuller is a 56 year old male with a long history of having significant degenerative changes in both of his knees.  He is awaiting evaluation for possible knee replacement at the [facility]. He recently had a twisting injury to his knee and has started having severe pain in the left knee.  We treated conservatively for a period of time, but after failure of conservative care, he was taken to the operating room for operative knee arthroscopy.  Preoperative MRI showed medial meniscal tear and lateral loose body.  He was brought to the operating room for evaluation.  PROCEDURE IN DETAIL:  The patient was brought to the operating room procedure and after adequate level of anesthesia was obtained with general anesthetic, the patient was placed supine on the operating table.  The left leg was then prepped and draped in usual sterile fashion.  Following this, routine arthroscopic examination of the knee revealed there was an obvious medial meniscal tear with an  undersurface flap.  This was debrided back to a smooth and stable rim.  Attention was turned to the notch where the ACL was normal.  Attention was turned laterally where there was a posterolateral meniscal tear and chondromalacia of the lateral tibial plateau which was debrided.  On the medial side, there was grade 4 change over a fairly large area of the medial femoral condyle.  The osteocartilaginous loose body was identified and removed.  Attention was then turned towards the patellofemoral joint where there was mild chondromalacia, and there was a large medial shelf plica which was debrided.  Once this was completed, the knee was copiously and thoroughly lavaged and suctioned dry.  Final check was made for any loose and fragmented pieces or other loose bodies.  Seeing none, the knee was again irrigated and suctioned dry.  All the sterile portals were closed with bandage.  A sterile compressive dressing was applied and the patient was taken to the recovery and was noted to be satisfactory condition.  Estimated blood loss for the procedure was none.     Harvie Junior, M.D.     Ranae Plumber  D:  05/12/2012  T:  05/13/2012  Job:  [id number]

## 2012-05-16 ENCOUNTER — Encounter (HOSPITAL_BASED_OUTPATIENT_CLINIC_OR_DEPARTMENT_OTHER): Payer: Self-pay | Admitting: Orthopedic Surgery

## 2012-05-18 ENCOUNTER — Encounter (HOSPITAL_BASED_OUTPATIENT_CLINIC_OR_DEPARTMENT_OTHER): Payer: Self-pay

## 2012-10-10 ENCOUNTER — Encounter (HOSPITAL_COMMUNITY): Payer: Self-pay

## 2012-10-11 ENCOUNTER — Other Ambulatory Visit: Payer: Self-pay | Admitting: Orthopedic Surgery

## 2012-10-12 ENCOUNTER — Ambulatory Visit (HOSPITAL_COMMUNITY)
Admission: RE | Admit: 2012-10-12 | Discharge: 2012-10-12 | Disposition: A | Discharge status: Home / Self Care | Payer: BC Managed Care – PPO | Source: Ambulatory Visit | Attending: Orthopedic Surgery | Admitting: Orthopedic Surgery

## 2012-10-12 ENCOUNTER — Encounter (HOSPITAL_COMMUNITY): Payer: Self-pay

## 2012-10-12 ENCOUNTER — Encounter (HOSPITAL_COMMUNITY)
Admission: RE | Admit: 2012-10-12 | Discharge: 2012-10-12 | Disposition: A | Discharge status: Home / Self Care | Payer: BC Managed Care – PPO | Source: Ambulatory Visit | Attending: Orthopedic Surgery | Admitting: Orthopedic Surgery

## 2012-10-12 DIAGNOSIS — F329 Major depressive disorder, single episode, unspecified: Secondary | ICD-10-CM | POA: Insufficient documentation

## 2012-10-12 DIAGNOSIS — K219 Gastro-esophageal reflux disease without esophagitis: Secondary | ICD-10-CM | POA: Insufficient documentation

## 2012-10-12 DIAGNOSIS — F3289 Other specified depressive episodes: Secondary | ICD-10-CM | POA: Insufficient documentation

## 2012-10-12 DIAGNOSIS — M1990 Unspecified osteoarthritis, unspecified site: Secondary | ICD-10-CM | POA: Insufficient documentation

## 2012-10-12 DIAGNOSIS — Z01812 Encounter for preprocedural laboratory examination: Secondary | ICD-10-CM | POA: Insufficient documentation

## 2012-10-12 DIAGNOSIS — Z01818 Encounter for other preprocedural examination: Secondary | ICD-10-CM | POA: Insufficient documentation

## 2012-10-12 DIAGNOSIS — G4733 Obstructive sleep apnea (adult) (pediatric): Secondary | ICD-10-CM | POA: Insufficient documentation

## 2012-10-12 DIAGNOSIS — Z01810 Encounter for preprocedural cardiovascular examination: Secondary | ICD-10-CM | POA: Insufficient documentation

## 2012-10-12 DIAGNOSIS — E039 Hypothyroidism, unspecified: Secondary | ICD-10-CM | POA: Insufficient documentation

## 2012-10-12 DIAGNOSIS — F17200 Nicotine dependence, unspecified, uncomplicated: Secondary | ICD-10-CM | POA: Insufficient documentation

## 2012-10-12 HISTORY — DX: Other complications of anesthesia, initial encounter: T88.59XA

## 2012-10-12 HISTORY — DX: Tinnitus, bilateral: H93.13

## 2012-10-12 HISTORY — DX: Hypothyroidism, unspecified: E03.9

## 2012-10-12 HISTORY — DX: Adverse effect of unspecified anesthetic, initial encounter: T41.45XA

## 2012-10-12 HISTORY — DX: Headache: R51

## 2012-10-12 LAB — CBC WITH DIFFERENTIAL/PLATELET
Basophils Absolute: 0.1 10*3/uL (ref 0.0–0.1)
Basophils Relative: 1 % (ref 0–1)
Eosinophils Absolute: 0.1 10*3/uL (ref 0.0–0.7)
Eosinophils Relative: 2 % (ref 0–5)
HCT: 54.6 % — ABNORMAL HIGH (ref 39.0–52.0)
Hemoglobin: 19.1 g/dL — ABNORMAL HIGH (ref 13.0–17.0)
Lymphocytes Relative: 35 % (ref 12–46)
Lymphs Abs: 2.6 10*3/uL (ref 0.7–4.0)
MCH: 31.1 pg (ref 26.0–34.0)
MCHC: 35 g/dL (ref 30.0–36.0)
MCV: 88.8 fL (ref 78.0–100.0)
Monocytes Absolute: 0.7 10*3/uL (ref 0.1–1.0)
Monocytes Relative: 9 % (ref 3–12)
Neutro Abs: 3.9 10*3/uL (ref 1.7–7.7)
Neutrophils Relative %: 53 % (ref 43–77)
Platelets: 197 10*3/uL (ref 150–400)
RBC: 6.15 MIL/uL — ABNORMAL HIGH (ref 4.22–5.81)
RDW: 14.4 % (ref 11.5–15.5)
WBC: 7.3 10*3/uL (ref 4.0–10.5)

## 2012-10-12 LAB — URINALYSIS, ROUTINE W REFLEX MICROSCOPIC
Bilirubin Urine: NEGATIVE
Glucose, UA: NEGATIVE mg/dL
Hgb urine dipstick: NEGATIVE
Ketones, ur: NEGATIVE mg/dL
Leukocytes, UA: NEGATIVE
Nitrite: NEGATIVE
Protein, ur: NEGATIVE mg/dL
Specific Gravity, Urine: 1.011 (ref 1.005–1.030)
Urobilinogen, UA: 0.2 mg/dL (ref 0.0–1.0)
pH: 6.5 (ref 5.0–8.0)

## 2012-10-12 LAB — SURGICAL PCR SCREEN
MRSA, PCR: NEGATIVE
Staphylococcus aureus: POSITIVE — AB

## 2012-10-12 LAB — COMPREHENSIVE METABOLIC PANEL
ALT: 23 U/L (ref 0–53)
AST: 21 U/L (ref 0–37)
Albumin: 4 g/dL (ref 3.5–5.2)
Alkaline Phosphatase: 57 U/L (ref 39–117)
BUN: 10 mg/dL (ref 6–23)
CO2: 27 mEq/L (ref 19–32)
Calcium: 9.6 mg/dL (ref 8.4–10.5)
Chloride: 104 mEq/L (ref 96–112)
Creatinine, Ser: 1.04 mg/dL (ref 0.50–1.35)
GFR calc Af Amer: >90 mL/min (ref >90)
GFR calc non Af Amer: 79 mL/min — ABNORMAL LOW (ref >90)
Glucose, Bld: 105 mg/dL — ABNORMAL HIGH (ref 70–99)
Potassium: 4.2 mEq/L (ref 3.5–5.1)
Sodium: 140 mEq/L (ref 135–145)
Total Bilirubin: 0.3 mg/dL (ref 0.3–1.2)
Total Protein: 7.4 g/dL (ref 6.0–8.3)

## 2012-10-12 LAB — APTT: aPTT: 28 seconds (ref 24–37)

## 2012-10-12 LAB — PROTIME-INR
INR: 0.85 (ref 0.00–1.49)
Prothrombin Time: 11.6 seconds (ref 11.6–15.2)

## 2012-10-12 LAB — TYPE AND SCREEN
ABO/RH(D): O POS
Antibody Screen: NEGATIVE

## 2012-10-12 LAB — ABO/RH: ABO/RH(D): O POS

## 2012-10-12 NOTE — Progress Notes (Signed)
Pt denies SOB, chest pain, and being currently treated by a cardiologist.

## 2012-10-12 NOTE — Pre-Procedure Instructions (Signed)
EDGERRIN CORREIA  10/12/2012   Your procedure is scheduled on: Monday, October 16, 2012  Report to [facility] at 11:35 AM.  Call this number if you have problems the morning of surgery: [phone]   Remember:   Do not eat food or drink liquids after midnight.   Take these medicines the morning of surgery with A SIP OF WATER: esomeprazole (NEXIUM) 40 MG, levothyroxine (SYNTHROID, LEVOTHROID) 150 MCG tablet, Stop taking herbal medication: fish oil-omega-3 fatty acids 1000 MG capsule,  loratadine-pseudoephedrine (CLARITIN-D 12-HOUR) 5-120 MG per tablet      s . Do not take any NSAIDs ie: Ibuprofen, Advil, Naproxen or any medication containing Aspirin.    Do not wear jewelry, make-up or nail polish.  Do not wear lotions, powders, or perfumes. You may wear deodorant.  Do not shave 48 hours prior to surgery. Men may shave face and neck.  Do not bring valuables to the hospital.  Contacts, dentures or bridgework may not be worn into surgery.  Leave suitcase in the car. After surgery it may be brought to your room.  For patients admitted to the hospital, checkout time is 11:00 AM the day of  discharge.   Patients discharged the day of surgery will not be allowed to drive  home.  Name and phone number of your driver:  Special Instructions: Shower using CHG 2 nights before surgery and the night before surgery.  If you shower the day of surgery use CHG.  Use special wash - you have one bottle of CHG for all showers.  You should use approximately 1/3 of the bottle for each shower.   Please read over the following fact sheets that you were given: Pain Booklet, Coughing and Deep Breathing, Blood Transfusion Information and Surgical Site Infection Prevention

## 2012-10-13 ENCOUNTER — Encounter (HOSPITAL_COMMUNITY): Payer: Self-pay

## 2012-10-13 NOTE — Progress Notes (Addendum)
Anesthesia chart review: Patient is a 57 year old male scheduled for left total knee arthroplasty by Dr. Luiz Blare on 10/16/2012. History includes smoking, obstructive sleep apnea with CPAP use, hypothyroidism, depression, GERD, headaches, tinnitus, DJD, osteoarthritis. For anesthesia history he reports some numbness on the right side of tongue following a previous surgery and continues to have some gum sensitivity. PCP is listed as Dr. Lucky Cowboy.  EKG on 10/12/12 showed NSR, possible LAE, incomplete right BBB, LAFB.  Currently, there are no comparison EKGs.  His local PCP Dr. Kathryne Sharper office is now closed until 10/16/12.  He reports a prior normal stress and cardiac cath around 2009 at the [facility].  He states, his [facility] [place] PCP Dr. Sherrie Mustache @ [phone] would be the best route to obtain these records. They were requested yesterday (via fax with release) and again today (via voicemail), and I also faxed a request to the [facility] in [place] too.  CXR on 10/12/12 showed no acute disease.  Preoperative labs noted. H/H 19.1/54.6.  Previous pre-op H/H in November 2013 was 18.3-18.8/54.7, so probably not significantly changed.  He is a smoker and has OSA which could be contributing.    His cardiac testing was nearly five years ago, and he reports they were normal.  If additional records received today then I can review, otherwise the short stay and anesthesia staff will have to follow-up records as available on the day of surgery.  Since tests were nearly five years ago, and he tolerated GA with LMA for left knee arthroscopy with debridement and partial medial and lateral meniscectomy less than five months ago would anticipate he could proceed if no acute CV symptoms.  Velna Ochs [facility]/Anesthesiology Phone [phone] 10/13/2012 12:15 PM  Addendum: 10/13/12 1345 I received records from the [place].  He apparently had an abnormal stress test on  02/11/10 that showed inferior/lateral wall ischemia with normal LV function.  The Cardiologist Dr. Lyna Poser from [facility] referred patient for cardiac cath which was done on 03/30/10 and showed normal systemic pressures, normal LVEDP, normal coronary arteries (ostium of the RCA off of the left coronary cusp). EF 55%.  No MR.  No echo was faxed.  EKG there on 02/18/10 showed NSR, right superior axis deviation and EKG on 03/13/10 showed NSR with LAFB.  Patient had normal coronaries by cath less than three years ago, so as above, anticipate he can proceed as planned.

## 2012-10-15 MED ORDER — POVIDONE-IODINE 7.5 % EX SOLN
Freq: Once | CUTANEOUS | Status: DC
  Filled 2012-10-15: qty 118

## 2012-10-15 MED ORDER — CEFAZOLIN SODIUM-DEXTROSE 2-3 GM-% IV SOLR
2.0000 g | INTRAVENOUS | Status: AC
  Administered 2012-10-16: 2 g via INTRAVENOUS
  Filled 2012-10-15: qty 50

## 2012-10-16 ENCOUNTER — Encounter (HOSPITAL_COMMUNITY): Payer: Self-pay | Admitting: Vascular Surgery

## 2012-10-16 ENCOUNTER — Inpatient Hospital Stay (HOSPITAL_COMMUNITY): Payer: BC Managed Care – PPO | Admitting: Certified Registered Nurse Anesthetist

## 2012-10-16 ENCOUNTER — Encounter (HOSPITAL_COMMUNITY): Payer: Self-pay | Admitting: *Deleted

## 2012-10-16 ENCOUNTER — Encounter (HOSPITAL_COMMUNITY)
Admission: RE | Disposition: A | Discharge status: Home Health | Payer: Self-pay | Source: Ambulatory Visit | Attending: Orthopedic Surgery

## 2012-10-16 ENCOUNTER — Inpatient Hospital Stay (HOSPITAL_COMMUNITY)
Admission: RE | Admit: 2012-10-16 | Discharge: 2012-10-18 | DRG: 209 | Disposition: A | Discharge status: Home Health | Payer: BC Managed Care – PPO | Source: Ambulatory Visit | Attending: Orthopedic Surgery | Admitting: Orthopedic Surgery

## 2012-10-16 DIAGNOSIS — E039 Hypothyroidism, unspecified: Secondary | ICD-10-CM | POA: Diagnosis present

## 2012-10-16 DIAGNOSIS — F329 Major depressive disorder, single episode, unspecified: Secondary | ICD-10-CM | POA: Diagnosis present

## 2012-10-16 DIAGNOSIS — M1712 Unilateral primary osteoarthritis, left knee: Secondary | ICD-10-CM | POA: Diagnosis present

## 2012-10-16 DIAGNOSIS — Z79899 Other long term (current) drug therapy: Secondary | ICD-10-CM

## 2012-10-16 DIAGNOSIS — F3289 Other specified depressive episodes: Secondary | ICD-10-CM | POA: Diagnosis present

## 2012-10-16 DIAGNOSIS — K219 Gastro-esophageal reflux disease without esophagitis: Secondary | ICD-10-CM | POA: Diagnosis present

## 2012-10-16 DIAGNOSIS — F17200 Nicotine dependence, unspecified, uncomplicated: Secondary | ICD-10-CM | POA: Diagnosis present

## 2012-10-16 DIAGNOSIS — G4733 Obstructive sleep apnea (adult) (pediatric): Secondary | ICD-10-CM | POA: Diagnosis present

## 2012-10-16 DIAGNOSIS — M1710 Unilateral primary osteoarthritis, unspecified knee: Principal | ICD-10-CM | POA: Diagnosis present

## 2012-10-16 HISTORY — PX: KNEE ARTHROPLASTY: SHX992

## 2012-10-16 HISTORY — PX: TOTAL KNEE ARTHROPLASTY: SHX125

## 2012-10-16 SURGERY — ARTHROPLASTY, KNEE, TOTAL, USING IMAGELESS COMPUTER-ASSISTED NAVIGATION
Anesthesia: General | Site: Knee | Laterality: Left | Wound class: Clean

## 2012-10-16 MED ORDER — CEFAZOLIN SODIUM-DEXTROSE 2-3 GM-% IV SOLR
2.0000 g | Freq: Four times a day (QID) | INTRAVENOUS | Status: AC
  Administered 2012-10-16 – 2012-10-17 (×2): 2 g via INTRAVENOUS
  Filled 2012-10-16 (×2): qty 50

## 2012-10-16 MED ORDER — HYDROMORPHONE HCL PF 1 MG/ML IJ SOLN
0.2500 mg | INTRAMUSCULAR | Status: DC | PRN
  Administered 2012-10-16 (×4): 0.5 mg via INTRAVENOUS

## 2012-10-16 MED ORDER — HYDROMORPHONE HCL PF 1 MG/ML IJ SOLN
1.0000 mg | INTRAMUSCULAR | Status: DC | PRN
  Administered 2012-10-17 – 2012-10-18 (×5): 1 mg via INTRAVENOUS
  Filled 2012-10-16 (×8): qty 1

## 2012-10-16 MED ORDER — FENTANYL CITRATE 0.05 MG/ML IJ SOLN: 50.0000 ug | INTRAMUSCULAR | Status: DC | PRN

## 2012-10-16 MED ORDER — METOCLOPRAMIDE HCL 5 MG/ML IJ SOLN: 10.0000 mg | Freq: Once | INTRAMUSCULAR | Status: DC | PRN

## 2012-10-16 MED ORDER — ARTIFICIAL TEARS OP OINT
TOPICAL_OINTMENT | OPHTHALMIC | Status: DC | PRN
  Administered 2012-10-16: 1 via OPHTHALMIC

## 2012-10-16 MED ORDER — PROPOFOL 10 MG/ML IV BOLUS
INTRAVENOUS | Status: DC | PRN
  Administered 2012-10-16: 200 mg via INTRAVENOUS

## 2012-10-16 MED ORDER — HYDROMORPHONE HCL PF 1 MG/ML IJ SOLN
INTRAMUSCULAR | Status: AC
  Administered 2012-10-16: 1 mg via INTRAVENOUS
  Filled 2012-10-16: qty 1

## 2012-10-16 MED ORDER — DEXTROSE-NACL 5-0.45 % IV SOLN
INTRAVENOUS | Status: DC
  Administered 2012-10-16: 22:00:00 via INTRAVENOUS

## 2012-10-16 MED ORDER — METHOCARBAMOL 500 MG PO TABS
ORAL_TABLET | ORAL | Status: AC
  Filled 2012-10-16: qty 1

## 2012-10-16 MED ORDER — DOCUSATE SODIUM 100 MG PO CAPS
100.0000 mg | ORAL_CAPSULE | Freq: Two times a day (BID) | ORAL | Status: DC
  Administered 2012-10-17 – 2012-10-18 (×3): 100 mg via ORAL
  Filled 2012-10-16 (×4): qty 1

## 2012-10-16 MED ORDER — POLYETHYLENE GLYCOL 3350 17 G PO PACK: 17.0000 g | PACK | Freq: Every day | ORAL | Status: DC | PRN

## 2012-10-16 MED ORDER — SODIUM CHLORIDE 0.9 % IJ SOLN
INTRAMUSCULAR | Status: DC | PRN
  Administered 2012-10-16: 16:00:00

## 2012-10-16 MED ORDER — SODIUM CHLORIDE 0.9 % IR SOLN
Status: DC | PRN
  Administered 2012-10-16: 3000 mL

## 2012-10-16 MED ORDER — ACETAMINOPHEN 10 MG/ML IV SOLN
INTRAVENOUS | Status: AC
  Administered 2012-10-16: 1000 mg via INTRAVENOUS
  Filled 2012-10-16: qty 100

## 2012-10-16 MED ORDER — DIPHENHYDRAMINE HCL 12.5 MG/5ML PO ELIX: 12.5000 mg | ORAL_SOLUTION | ORAL | Status: DC | PRN

## 2012-10-16 MED ORDER — OXYCODONE HCL 5 MG PO TABS
5.0000 mg | ORAL_TABLET | Freq: Once | ORAL | Status: AC | PRN
  Administered 2012-10-16: 5 mg via ORAL

## 2012-10-16 MED ORDER — OXYCODONE HCL 5 MG PO TABS
5.0000 mg | ORAL_TABLET | ORAL | Status: DC | PRN
  Administered 2012-10-17: 5 mg via ORAL
  Administered 2012-10-17 – 2012-10-18 (×4): 10 mg via ORAL
  Filled 2012-10-16 (×5): qty 2

## 2012-10-16 MED ORDER — MIDAZOLAM HCL 2 MG/2ML IJ SOLN
INTRAMUSCULAR | Status: AC
  Filled 2012-10-16: qty 2

## 2012-10-16 MED ORDER — OXYCODONE HCL 5 MG/5ML PO SOLN: 5.0000 mg | Freq: Once | ORAL | Status: AC | PRN

## 2012-10-16 MED ORDER — FENTANYL CITRATE 0.05 MG/ML IJ SOLN
INTRAMUSCULAR | Status: AC
  Filled 2012-10-16: qty 2

## 2012-10-16 MED ORDER — METHOCARBAMOL 100 MG/ML IJ SOLN
500.0000 mg | Freq: Four times a day (QID) | INTRAVENOUS | Status: DC | PRN
  Filled 2012-10-16: qty 5

## 2012-10-16 MED ORDER — NEOSTIGMINE METHYLSULFATE 1 MG/ML IJ SOLN
INTRAMUSCULAR | Status: DC | PRN
  Administered 2012-10-16: 4 mg via INTRAVENOUS

## 2012-10-16 MED ORDER — ALUM & MAG HYDROXIDE-SIMETH 200-200-20 MG/5ML PO SUSP: 30.0000 mL | ORAL | Status: DC | PRN

## 2012-10-16 MED ORDER — FENTANYL CITRATE 0.05 MG/ML IJ SOLN
INTRAMUSCULAR | Status: DC | PRN
  Administered 2012-10-16: 50 ug via INTRAVENOUS
  Administered 2012-10-16: 100 ug via INTRAVENOUS
  Administered 2012-10-16: 50 ug via INTRAVENOUS
  Administered 2012-10-16 (×2): 100 ug via INTRAVENOUS

## 2012-10-16 MED ORDER — HYDROMORPHONE HCL PF 1 MG/ML IJ SOLN
INTRAMUSCULAR | Status: AC
  Administered 2012-10-17: 1 mg via INTRAVENOUS
  Filled 2012-10-16: qty 1

## 2012-10-16 MED ORDER — ACETAMINOPHEN 10 MG/ML IV SOLN
1000.0000 mg | Freq: Four times a day (QID) | INTRAVENOUS | Status: AC
  Administered 2012-10-16 – 2012-10-17 (×2): 1000 mg via INTRAVENOUS
  Filled 2012-10-16 (×6): qty 100

## 2012-10-16 MED ORDER — ROCURONIUM BROMIDE 100 MG/10ML IV SOLN
INTRAVENOUS | Status: DC | PRN
  Administered 2012-10-16: 50 mg via INTRAVENOUS

## 2012-10-16 MED ORDER — BUPIVACAINE-EPINEPHRINE PF 0.5-1:200000 % IJ SOLN
INTRAMUSCULAR | Status: DC | PRN
  Administered 2012-10-16: 25 mL

## 2012-10-16 MED ORDER — ONDANSETRON HCL 4 MG/2ML IJ SOLN: 4.0000 mg | Freq: Four times a day (QID) | INTRAMUSCULAR | Status: DC | PRN

## 2012-10-16 MED ORDER — METOCLOPRAMIDE HCL 5 MG/ML IJ SOLN: 5.0000 mg | Freq: Three times a day (TID) | INTRAMUSCULAR | Status: DC | PRN

## 2012-10-16 MED ORDER — LACTATED RINGERS IV SOLN: INTRAVENOUS | Status: DC

## 2012-10-16 MED ORDER — LIDOCAINE HCL (CARDIAC) 20 MG/ML IV SOLN
INTRAVENOUS | Status: DC | PRN
  Administered 2012-10-16: 100 mg via INTRAVENOUS

## 2012-10-16 MED ORDER — PANTOPRAZOLE SODIUM 40 MG PO TBEC
80.0000 mg | DELAYED_RELEASE_TABLET | Freq: Every day | ORAL | Status: DC
  Administered 2012-10-17 – 2012-10-18 (×2): 80 mg via ORAL
  Filled 2012-10-16 (×2): qty 2

## 2012-10-16 MED ORDER — LEVOTHYROXINE SODIUM 150 MCG PO TABS
150.0000 ug | ORAL_TABLET | Freq: Every day | ORAL | Status: DC
  Administered 2012-10-17 – 2012-10-18 (×2): 150 ug via ORAL
  Filled 2012-10-16 (×3): qty 1

## 2012-10-16 MED ORDER — ONDANSETRON HCL 4 MG PO TABS
4.0000 mg | ORAL_TABLET | Freq: Four times a day (QID) | ORAL | Status: DC | PRN
  Administered 2012-10-17: 4 mg via ORAL
  Filled 2012-10-16: qty 1

## 2012-10-16 MED ORDER — EZETIMIBE-SIMVASTATIN 10-40 MG PO TABS
1.0000 | ORAL_TABLET | Freq: Every day | ORAL | Status: DC
  Administered 2012-10-17: 1 via ORAL
  Filled 2012-10-16 (×3): qty 1

## 2012-10-16 MED ORDER — LACTATED RINGERS IV SOLN
INTRAVENOUS | Status: DC | PRN
  Administered 2012-10-16 (×3): via INTRAVENOUS

## 2012-10-16 MED ORDER — GLYCOPYRROLATE 0.2 MG/ML IJ SOLN
INTRAMUSCULAR | Status: DC | PRN
  Administered 2012-10-16: .4 mg via INTRAVENOUS

## 2012-10-16 MED ORDER — ASPIRIN EC 325 MG PO TBEC
325.0000 mg | DELAYED_RELEASE_TABLET | Freq: Two times a day (BID) | ORAL | Status: DC
  Administered 2012-10-17 – 2012-10-18 (×3): 325 mg via ORAL
  Filled 2012-10-16 (×5): qty 1

## 2012-10-16 MED ORDER — CEFUROXIME SODIUM 1.5 G IJ SOLR
INTRAMUSCULAR | Status: AC
  Filled 2012-10-16: qty 1.5

## 2012-10-16 MED ORDER — MIDAZOLAM HCL 2 MG/2ML IJ SOLN: 1.0000 mg | INTRAMUSCULAR | Status: DC | PRN

## 2012-10-16 MED ORDER — BUPIVACAINE LIPOSOME 1.3 % IJ SUSP
20.0000 mL | INTRAMUSCULAR | Status: DC
  Filled 2012-10-16: qty 20

## 2012-10-16 MED ORDER — CEFUROXIME SODIUM 1.5 G IJ SOLR
INTRAMUSCULAR | Status: DC | PRN
  Administered 2012-10-16: 1.5 g

## 2012-10-16 MED ORDER — ONDANSETRON HCL 4 MG/2ML IJ SOLN
INTRAMUSCULAR | Status: DC | PRN
  Administered 2012-10-16: 4 mg via INTRAVENOUS

## 2012-10-16 MED ORDER — MUPIROCIN 2 % EX OINT
TOPICAL_OINTMENT | Freq: Two times a day (BID) | CUTANEOUS | Status: DC
  Administered 2012-10-16 – 2012-10-18 (×3): via TOPICAL
  Filled 2012-10-16: qty 22

## 2012-10-16 MED ORDER — MIDAZOLAM HCL 5 MG/5ML IJ SOLN
INTRAMUSCULAR | Status: DC | PRN
  Administered 2012-10-16: 2 mg via INTRAVENOUS

## 2012-10-16 MED ORDER — FERROUS SULFATE 325 (65 FE) MG PO TABS
325.0000 mg | ORAL_TABLET | Freq: Two times a day (BID) | ORAL | Status: DC
  Administered 2012-10-17 – 2012-10-18 (×3): 325 mg via ORAL
  Filled 2012-10-16 (×5): qty 1

## 2012-10-16 MED ORDER — OXYCODONE HCL 5 MG PO TABS
ORAL_TABLET | ORAL | Status: AC
  Filled 2012-10-16: qty 1

## 2012-10-16 MED ORDER — METHOCARBAMOL 500 MG PO TABS
500.0000 mg | ORAL_TABLET | Freq: Four times a day (QID) | ORAL | Status: DC | PRN
  Administered 2012-10-16 – 2012-10-18 (×6): 500 mg via ORAL
  Filled 2012-10-16 (×5): qty 1

## 2012-10-16 MED ORDER — METOCLOPRAMIDE HCL 10 MG PO TABS: 5.0000 mg | ORAL_TABLET | Freq: Three times a day (TID) | ORAL | Status: DC | PRN

## 2012-10-16 MED ORDER — ZOLPIDEM TARTRATE 5 MG PO TABS: 5.0000 mg | ORAL_TABLET | Freq: Every evening | ORAL | Status: DC | PRN

## 2012-10-16 SURGICAL SUPPLY — 71 items
APL SKNCLS STERI-STRIP NONHPOA (GAUZE/BANDAGES/DRESSINGS) ×1
BANDAGE ESMARK 6X9 LF (GAUZE/BANDAGES/DRESSINGS) ×1 IMPLANT
BENZOIN TINCTURE PRP APPL 2/3 (GAUZE/BANDAGES/DRESSINGS) ×2 IMPLANT
BLADE SAGITTAL 25.0X1.19X90 (BLADE) ×2 IMPLANT
BLADE SAW SAG 90X13X1.27 (BLADE) ×2 IMPLANT
BNDG CMPR 9X6 STRL LF SNTH (GAUZE/BANDAGES/DRESSINGS) ×1
BNDG CMPR MED 15X6 ELC VLCR LF (GAUZE/BANDAGES/DRESSINGS) ×1
BNDG ELASTIC 6X15 VLCR STRL LF (GAUZE/BANDAGES/DRESSINGS) ×2 IMPLANT
BNDG ESMARK 6X9 LF (GAUZE/BANDAGES/DRESSINGS) ×2
BOWL SMART MIX CTS (DISPOSABLE) ×2 IMPLANT
CEMENT HV SMART SET (Cement) ×4 IMPLANT
CLOTH BEACON ORANGE TIMEOUT ST (SAFETY) ×2 IMPLANT
CLSR STERI-STRIP ANTIMIC 1/2X4 (GAUZE/BANDAGES/DRESSINGS) ×2 IMPLANT
COVER BACK TABLE 24X17X13 BIG (DRAPES) IMPLANT
COVER SURGICAL LIGHT HANDLE (MISCELLANEOUS) ×2 IMPLANT
CUFF TOURNIQUET SINGLE 34IN LL (TOURNIQUET CUFF) ×2 IMPLANT
CUFF TOURNIQUET SINGLE 44IN (TOURNIQUET CUFF) IMPLANT
DRAPE EXTREMITY T 121X128X90 (DRAPE) ×2 IMPLANT
DRAPE U-SHAPE 47X51 STRL (DRAPES) ×2 IMPLANT
DRSG PAD ABDOMINAL 8X10 ST (GAUZE/BANDAGES/DRESSINGS) ×4 IMPLANT
DURAPREP 26ML APPLICATOR (WOUND CARE) ×2 IMPLANT
ELECT REM PT RETURN 9FT ADLT (ELECTROSURGICAL) ×2
ELECTRODE REM PT RTRN 9FT ADLT (ELECTROSURGICAL) ×1 IMPLANT
EVACUATOR 1/8 PVC DRAIN (DRAIN) ×2 IMPLANT
FACESHIELD LNG OPTICON STERILE (SAFETY) ×2 IMPLANT
GAUZE XEROFORM 5X9 LF (GAUZE/BANDAGES/DRESSINGS) ×2 IMPLANT
GLOVE BIOGEL PI IND STRL 8 (GLOVE) ×2 IMPLANT
GLOVE BIOGEL PI INDICATOR 8 (GLOVE) ×2
GLOVE ECLIPSE 7.5 STRL STRAW (GLOVE) ×4 IMPLANT
GOWN PREVENTION PLUS XLARGE (GOWN DISPOSABLE) ×6 IMPLANT
GOWN SRG XL XLNG 56XLVL 4 (GOWN DISPOSABLE) IMPLANT
GOWN STRL NON-REIN LRG LVL3 (GOWN DISPOSABLE) IMPLANT
GOWN STRL NON-REIN XL XLG LVL4 (GOWN DISPOSABLE)
HANDPIECE INTERPULSE COAX TIP (DISPOSABLE) ×2
HOOD PEEL AWAY FACE SHEILD DIS (HOOD) ×4 IMPLANT
IMMOBILIZER KNEE 20 (SOFTGOODS)
IMMOBILIZER KNEE 20 THIGH 36 (SOFTGOODS) IMPLANT
IMMOBILIZER KNEE 22 UNIV (SOFTGOODS) ×2 IMPLANT
IMMOBILIZER KNEE 24 THIGH 36 (MISCELLANEOUS) IMPLANT
IMMOBILIZER KNEE 24 UNIV (MISCELLANEOUS)
KIT BASIN OR (CUSTOM PROCEDURE TRAY) ×2 IMPLANT
KIT ROOM TURNOVER OR (KITS) ×2 IMPLANT
MANIFOLD NEPTUNE II (INSTRUMENTS) ×2 IMPLANT
MARKER SPHERE PSV REFLC THRD 5 (MARKER) ×4 IMPLANT
NEEDLE HYPO 25GX1X1/2 BEV (NEEDLE) ×2 IMPLANT
NS IRRIG 1000ML POUR BTL (IV SOLUTION) ×2 IMPLANT
PACK TOTAL JOINT (CUSTOM PROCEDURE TRAY) ×2 IMPLANT
PAD ARMBOARD 7.5X6 YLW CONV (MISCELLANEOUS) ×4 IMPLANT
PAD CAST 4YDX4 CTTN HI CHSV (CAST SUPPLIES) ×1 IMPLANT
PADDING CAST ABS 6INX4YD NS (CAST SUPPLIES) ×1
PADDING CAST ABS COTTON 6X4 NS (CAST SUPPLIES) ×1 IMPLANT
PADDING CAST COTTON 4X4 STRL (CAST SUPPLIES) ×1
PIN SCHANZ 4MM 130MM (PIN) ×8 IMPLANT
SET HNDPC FAN SPRY TIP SCT (DISPOSABLE) ×1 IMPLANT
SPONGE GAUZE 4X4 12PLY (GAUZE/BANDAGES/DRESSINGS) ×2 IMPLANT
STAPLER VISISTAT 35W (STAPLE) IMPLANT
STRIP CLOSURE SKIN 1/2X4 (GAUZE/BANDAGES/DRESSINGS) ×2 IMPLANT
SUCTION FRAZIER TIP 10 FR DISP (SUCTIONS) ×2 IMPLANT
SUT MNCRL AB 3-0 PS2 18 (SUTURE) ×2 IMPLANT
SUT MON AB 3-0 SH 27 (SUTURE)
SUT MON AB 3-0 SH27 (SUTURE) IMPLANT
SUT VIC AB 0 CTB1 27 (SUTURE) ×4 IMPLANT
SUT VIC AB 1 CT1 27 (SUTURE) ×2
SUT VIC AB 1 CT1 27XBRD ANBCTR (SUTURE) ×2 IMPLANT
SUT VIC AB 2-0 CTB1 (SUTURE) ×4 IMPLANT
SYR 50ML LL SCALE MARK (SYRINGE) ×2 IMPLANT
SYR CONTROL 10ML LL (SYRINGE) ×2 IMPLANT
TOWEL OR 17X24 6PK STRL BLUE (TOWEL DISPOSABLE) ×2 IMPLANT
TOWEL OR 17X26 10 PK STRL BLUE (TOWEL DISPOSABLE) ×2 IMPLANT
TRAY FOLEY CATH 14FR (SET/KITS/TRAYS/PACK) ×2 IMPLANT
WATER STERILE IRR 1000ML POUR (IV SOLUTION) ×2 IMPLANT

## 2012-10-16 NOTE — Progress Notes (Signed)
Orthopedic Tech Progress Note Patient Details:  Omar [name] 17-Dec-1955 [id number] Deleted double ortho tech visit. Patient ID: [name], male   DOB: 1956/03/12, 57 y.o.   MRN: [id number]   [name] 10/16/2012, 9:28 PM

## 2012-10-16 NOTE — Progress Notes (Signed)
Orthopedic Tech Progress Note Patient Details:  Omar [name] Oct 21, 1955 [id number]  Ortho Devices Ortho Device/Splint Location: footsie roll Ortho Device/Splint Interventions: Ordered;Application   [name] 10/16/2012, 9:27 PM

## 2012-10-16 NOTE — Anesthesia Procedure Notes (Addendum)
Anesthesia Regional Block:  Femoral nerve block  Pre-Anesthetic Checklist: ,, timeout performed, Correct Patient, Correct Site, Correct Laterality, Correct Procedure, Correct Position, site marked, Risks and benefits discussed,  Surgical consent,  Pre-op evaluation,  At surgeon's request and post-op pain management  Laterality: Left  Prep: chloraprep       Needles:   Needle Type: Other     Needle Length: 9cm  Needle Gauge: 21    Additional Needles:  Procedures: ultrasound guided (picture in chart) Femoral nerve block Narrative:  Start time: 10/16/2012 1:00 PM End time: 10/16/2012 1:06 PM Injection made incrementally with aspirations every 5 mL.  Performed by: Personally  Anesthesiologist: C. Frederick MD  Additional Notes: Ultrasound guidance used to: id relevant anatomy, confirm needle position, local anesthetic spread, avoidance of vascular puncture. Picture saved. No complications. Block performed personally by Janetta Hora. Gelene Mink, MD    Femoral nerve block Procedure Name: Intubation Date/Time: 10/16/2012 3:03 PM Performed by: Coralee Rud Pre-anesthesia Checklist: Patient identified, Emergency Drugs available, Suction available and Patient being monitored Patient Re-evaluated:Patient Re-evaluated prior to inductionOxygen Delivery Method: Circle system utilized Preoxygenation: Pre-oxygenation with 100% oxygen Intubation Type: IV induction Ventilation: Mask ventilation without difficulty Laryngoscope Size: Miller and 3 Grade View: Grade II Tube type: Oral Number of attempts: 1 Airway Equipment and Method: Stylet    Procedure Name: Intubation Date/Time: 10/16/2012 3:11 PM Performed by: Coralee Rud Pre-anesthesia Checklist: Patient identified, Emergency Drugs available, Suction available and Patient being monitored Patient Re-evaluated:Patient Re-evaluated prior to inductionOxygen Delivery Method: Circle system utilized Preoxygenation: Pre-oxygenation with  100% oxygen Intubation Type: IV induction Ventilation: Mask ventilation without difficulty Laryngoscope size: Elective glidescope utilized. Grade View: Grade II Tube type: Oral Tube size: 7.5 mm Number of attempts: 1 Airway Equipment and Method: Video-laryngoscopy (Careful elective glidescope intubation, LTA through ETT) Placement Confirmation: ETT inserted through vocal cords under direct vision and positive ETCO2 Secured at: 22 cm Dental Injury: Teeth and Oropharynx as per pre-operative assessment

## 2012-10-16 NOTE — Progress Notes (Signed)
Orthopedic Tech Progress Note Patient Details:  Omar [name] 02-27-1956 [id number]  CPM Left Knee CPM Left Knee: On Left Knee Flexion (Degrees): 60 Left Knee Extension (Degrees): 0 Additional Comments: put on ohf   [name] 10/16/2012, 6:38 PM

## 2012-10-16 NOTE — Transfer of Care (Signed)
Immediate Anesthesia Transfer of Care Note  Patient: Omar [name]  Procedure(s) Performed: Procedure(s): LEFT COMPUTER ASSISTED TOTAL KNEE ARTHROPLASTY (Left)  Patient Location: PACU  Anesthesia Type:General  Level of Consciousness: awake, sedated and patient cooperative  Airway & Oxygen Therapy: Patient Spontanous Breathing and Patient connected to face mask oxygen  Post-op Assessment: Report given to PACU RN  Post vital signs: Reviewed and stable  Complications: No apparent anesthesia complications

## 2012-10-16 NOTE — Anesthesia Postprocedure Evaluation (Signed)
  Anesthesia Post-op Note  Patient: Omar [name]  Procedure(s) Performed: Procedure(s): LEFT COMPUTER ASSISTED TOTAL KNEE ARTHROPLASTY (Left)  Patient Location: PACU  Anesthesia Type:General  Level of Consciousness: awake, alert  and oriented  Airway and Oxygen Therapy: Patient Spontanous Breathing and Patient connected to nasal cannula oxygen  Post-op Pain: mild  Post-op Assessment: Post-op Vital signs reviewed  Post-op Vital Signs: Reviewed  Complications: No apparent anesthesia complications

## 2012-10-16 NOTE — H&P (Signed)
TOTAL KNEE ADMISSION H&P  Patient is being admitted for left total knee arthroplasty.  Subjective:  Chief Complaint:left knee pain.  HPI: Omar [name], 57 y.o. male, has a history of pain and functional disability in the left knee due to arthritis and has failed non-surgical conservative treatments for greater than 12 weeks to includeNSAID's and/or analgesics, corticosteriod injections, viscosupplementation injections, flexibility and strengthening excercises, use of assistive devices and activity modification.  Onset of symptoms was abrupt, starting 1 years ago with rapidlly worsening course since that time. The patient noted prior procedures on the knee to include  arthroscopy and menisectomy on the left knee(s).  Patient currently rates pain in the left knee(s) at 10 out of 10 with activity. Patient has night pain, worsening of pain with activity and weight bearing, pain that interferes with activities of daily living, pain with passive range of motion and joint swelling.  Patient has evidence of subchondral sclerosis and joint space narrowing by imaging studies. This patient has had failure of conservatyive care. There is no active infection.  There are no active problems to display for this patient.  Past Medical History  Diagnosis Date  . Thyroid disease   . Arthritis   . GERD (gastroesophageal reflux disease)   . DJD (degenerative joint disease)   . Sleep apnea 2006    uses a cpap -desated to 55% during study  . Depression   . Complication of anesthesia     Pt states that he experienced some numbness on right side of tongue after surgery for 2 months; continues to experience gum sensitivity to date  . Hypothyroidism   . Headache     Sinus headaches  . Tinnitus of both ears     Hx: of    Past Surgical History  Procedure Laterality Date  . Knee surgery    . Upper gi endoscopy      x2  . Colonoscopy      x2  . Mouth surgery    . Knee arthroscopy  05/12/2012    Procedure:  ARTHROSCOPY KNEE;  Surgeon: Harvie Junior, MD;  Location: [facility];  Service: Orthopedics;  Laterality: Left;  . Cardiac catheterization  03/30/2010    normal coronaries, RCA takeoff of the left coronary cusp, EF 55% [facility])    Prescriptions prior to admission  Medication Sig Dispense Refill  . Cholecalciferol (VITAMIN D3) 5000 UNITS CAPS Take 1 capsule by mouth daily.      Marland Kitchen esomeprazole (NEXIUM) 40 MG capsule Take 40 mg by mouth 2 (two) times daily.      Marland Kitchen ezetimibe-simvastatin (VYTORIN) 10-40 MG per tablet Take 1 tablet by mouth at bedtime.      . fish oil-omega-3 fatty acids 1000 MG capsule Take 1 g by mouth 2 (two) times daily.       Marland Kitchen levothyroxine (SYNTHROID, LEVOTHROID) 150 MCG tablet Take 150 mcg by mouth daily.      Marland Kitchen loratadine-pseudoephedrine (CLARITIN-D 12-HOUR) 5-120 MG per tablet Take 1 tablet by mouth every morning.       . Multiple Vitamin (MULTIVITAMIN WITH MINERALS) TABS Take 1 tablet by mouth daily.      . mupirocin ointment (BACTROBAN) 2 % Apply topically 2 (two) times daily. For positive PCR      . TESTOSTERONE IM Inject into the muscle every 14 (fourteen) days.       No Known Allergies  History  Substance Use Topics  . Smoking status: Current Every Day Smoker --  1.00 packs/day for 40 years    Types: Cigarettes  . Smokeless tobacco: Never Used  . Alcohol Use: 0.5 oz/week    1 drink(s) per week     Comment: rarely    Family History  Problem Relation Age of Onset  . Cancer Mother     breast  . Hyperlipidemia Mother   . Hypertension Mother   . Cancer Father     pancreatic     ROS  Objective:  Physical Exam  Vital signs in last 24 hours: Temp:  [97.9 F (36.6 C)] 97.9 F (36.6 C) (04/14 1113) Pulse Rate:  [86-98] 86 (04/14 1300) Resp:  [17-20] 17 (04/14 1300) BP: (118-127)/(77-79) 127/79 mmHg (04/14 1314) SpO2:  [97 %-100 %] 100 % (04/14 1300)  Labs:   Estimated body mass index is 29.75 kg/(m^2) as calculated from the  following:   Height as of 10/12/12: 5\' 7"  (1.702 m).   Weight as of 05/12/12: 86.183 kg (190 lb).   Imaging Review Plain radiographs demonstrate moderate degenerative joint disease of the left knee(s). The overall alignment ismild varus. The bone quality appears to be good for age and reported activity level.  Assessment/Plan:  End stage arthritis, left knee   The patient history, physical examination, clinical judgment of the provider and imaging studies are consistent with end stage degenerative joint disease of the left knee(s) and total knee arthroplasty is deemed medically necessary. The treatment options including medical management, injection therapy arthroscopy and arthroplasty were discussed at length. The risks and benefits of total knee arthroplasty were presented and reviewed. The risks due to aseptic loosening, infection, stiffness, patella tracking problems, thromboembolic complications and other imponderables were discussed. The patient acknowledged the explanation, agreed to proceed with the plan and consent was signed. Patient is being admitted for inpatient treatment for surgery, pain control, PT, OT, prophylactic antibiotics, VTE prophylaxis, progressive ambulation and ADL's and discharge planning. The patient is planning to be discharged home with home health services

## 2012-10-16 NOTE — Anesthesia Preprocedure Evaluation (Addendum)
Anesthesia Evaluation  Patient identified by MRN, date of birth, ID band Patient awake    Reviewed: Allergy & Precautions, H&P , NPO status , Patient's Chart, lab work & pertinent test results, reviewed documented beta blocker date and time   Airway Mallampati: II TM Distance: >3 FB Neck ROM: full    Dental   Pulmonary sleep apnea , Current Smoker,  breath sounds clear to auscultation        Cardiovascular negative cardio ROS  Rhythm:regular     Neuro/Psych  Headaches, PSYCHIATRIC DISORDERS    GI/Hepatic Neg liver ROS, GERD-  Medicated and Controlled,  Endo/Other  Hypothyroidism   Renal/GU negative Renal ROS  negative genitourinary   Musculoskeletal   Abdominal   Peds  Hematology negative hematology ROS (+)   Anesthesia Other Findings See surgeon's H&P   Sx of oral numbness discussed and addressed. Strategies discussed, risks of reintubation accepted.  Reproductive/Obstetrics negative OB ROS                          Anesthesia Physical Anesthesia Plan  ASA: III  Anesthesia Plan: General   Post-op Pain Management:    Induction: Intravenous  Airway Management Planned: Oral ETT and Video Laryngoscope Planned  Additional Equipment:   Intra-op Plan:   Post-operative Plan: Extubation in OR  Informed Consent: I have reviewed the patients History and Physical, chart, labs and discussed the procedure including the risks, benefits and alternatives for the proposed anesthesia with the patient or authorized representative who has indicated his/her understanding and acceptance.   Dental Advisory Given  Plan Discussed with: CRNA and Surgeon  Anesthesia Plan Comments:        Anesthesia Quick Evaluation

## 2012-10-16 NOTE — Progress Notes (Signed)
Placed patient on home cpap unit with 4lpm 02 bleed in.  Patient resting well

## 2012-10-16 NOTE — Brief Op Note (Signed)
10/16/2012  5:55 PM  PATIENT:  Omar [name]  57 y.o. male  PRE-OPERATIVE DIAGNOSIS:  oa with left knee pain   POST-OPERATIVE DIAGNOSIS:  oa with left knee pain   PROCEDURE:  Procedure(s): LEFT COMPUTER ASSISTED TOTAL KNEE ARTHROPLASTY (Left)  SURGEON:  Surgeon(s) and Role:    * Harvie Junior, MD - Primary  PHYSICIAN ASSISTANT:   ASSISTANTS: bethune   ANESTHESIA:   general  EBL:  Total I/O In: 2300 [I.V.:2300] Out: 150 [Urine:150]  BLOOD ADMINISTERED:none  DRAINS: (1) Hemovact drain(s) in the l knee with  Suction Open   LOCAL MEDICATIONS USED:  MARCAINE     SPECIMEN:  No Specimen  DISPOSITION OF SPECIMEN:  N/A  COUNTS:  YES  TOURNIQUET:   Total Tourniquet Time Documented: Thigh (Left) - 80 minutes Total: Thigh (Left) - 80 minutes   DICTATION: .Other Dictation: Dictation Number [id number]  PLAN OF CARE: Admit to inpatient   PATIENT DISPOSITION:  PACU - hemodynamically stable.   Delay start of Pharmacological VTE agent (>24hrs) due to surgical blood loss or risk of bleeding  no

## 2012-10-17 LAB — BASIC METABOLIC PANEL
BUN: 8 mg/dL (ref 6–23)
CO2: 27 mEq/L (ref 19–32)
Calcium: 8.3 mg/dL — ABNORMAL LOW (ref 8.4–10.5)
Chloride: 103 mEq/L (ref 96–112)
Creatinine, Ser: 0.88 mg/dL (ref 0.50–1.35)
GFR calc Af Amer: >90 mL/min (ref >90)
GFR calc non Af Amer: >90 mL/min (ref >90)
Glucose, Bld: 123 mg/dL — ABNORMAL HIGH (ref 70–99)
Potassium: 3.9 mEq/L (ref 3.5–5.1)
Sodium: 138 mEq/L (ref 135–145)

## 2012-10-17 LAB — CBC
HCT: 45.2 % (ref 39.0–52.0)
Hemoglobin: 15.4 g/dL (ref 13.0–17.0)
MCH: 29.8 pg (ref 26.0–34.0)
MCHC: 34.1 g/dL (ref 30.0–36.0)
MCV: 87.6 fL (ref 78.0–100.0)
Platelets: 179 10*3/uL (ref 150–400)
RBC: 5.16 MIL/uL (ref 4.22–5.81)
RDW: 14.5 % (ref 11.5–15.5)
WBC: 10 10*3/uL (ref 4.0–10.5)

## 2012-10-17 MED ORDER — KETOROLAC TROMETHAMINE 30 MG/ML IJ SOLN
30.0000 mg | Freq: Four times a day (QID) | INTRAMUSCULAR | Status: AC
  Administered 2012-10-17 (×3): 30 mg via INTRAVENOUS
  Filled 2012-10-17 (×3): qty 1

## 2012-10-17 MED ORDER — KETOROLAC TROMETHAMINE 30 MG/ML IJ SOLN
INTRAMUSCULAR | Status: AC
  Administered 2012-10-17: 30 mg via INTRAVENOUS
  Filled 2012-10-17: qty 1

## 2012-10-17 MED ORDER — OXYCODONE HCL ER 10 MG PO T12A
20.0000 mg | EXTENDED_RELEASE_TABLET | Freq: Two times a day (BID) | ORAL | Status: DC
  Administered 2012-10-17 (×2): 20 mg via ORAL
  Filled 2012-10-17 (×3): qty 2

## 2012-10-17 NOTE — Progress Notes (Signed)
UR COMPLETED  

## 2012-10-17 NOTE — Op Note (Signed)
NAMEGLENROY, CROSSEN NO.:  [id number]  MEDICAL RECORD NO.:  [id number]  LOCATION:  [id number]                        FACILITY:  [facility]  PHYSICIAN:  Harvie Junior, M.D.   DATE OF BIRTH:  07/15/1955  DATE OF PROCEDURE:  10/16/2012 DATE OF DISCHARGE:                              OPERATIVE REPORT   PREOPERATIVE DIAGNOSIS:  End-stage degenerative joint disease, left knee.  POSTOPERATIVE DIAGNOSIS:  End-stage degenerative joint disease, left knee.  PROCEDURE:  Left total knee replacement with a Sigma system, size 4 femur, size 4 tibia, size 41 mm all polyethylene patella, and a 10 mm bridging bearing.  SURGEON:  Harvie Junior, M.D.  ASSISTANT:  Marshia Ly, P.A.  ANESTHESIA:  General.  BRIEF HISTORY:  Ms. Scipio is a 57 year old male with a long history of significant complaints of left knee pain that had been treated conservatively with activity modification, arthroscopy injection therapy after failure of all conservative care, excruciating pain at night and with light activity, the patient was taken to the operating room for left total knee replacement.  The patient had significant varus malalignment preoperatively.  Because of his young age, varus malalignment and need for perfect neutral long alignment, computer assistance was chosen to be used preoperatively and he was brought to the operating room for this.  DESCRIPTION OF PROCEDURE:  The patient was brought to the operating room.  After adequate anesthesia was obtained with general anesthetic, the patient was placed supine on the operating table.  Left leg was prepped and draped in usual sterile fashion.  Following this, the leg was exsanguinated.  Blood pressure inflated to 350 mmHg.  Following this, a midline incision was made.  Subcutaneous tissues down the level of extensor mechanism and a medial parapatellar arthrotomy was undertaken.  Because of significant varus malalignment, we went ahead and  did a fairly significant medial release to release the soft tissues to allow for neutral long alignment.  Following this, attention was turned to the knee where the synovium on anterior aspect of the femur, the retropatellar fat pad, medial lateral meniscus, and anterior posterior cruciates were excised.  Following this, the computer modules were placed, 2 pins in the tibia, 2 pins in the femur, and the arrays were placed.  The knee was put through a range of motion.  Registration process was undertaken, and following this, the tibia was cut perpendicular to its long axis under computer assistance.  The femur was cut perpendicular to the anatomic axis under computer assistance, and spacer blocks were put in place at this point.  Perfect neutral long alignment gap balance was achieved at this point, and the soft tissue alignment was good.  At this point, attention was turned to the femur, which was sized to a 4-1/2 and a size 4 was chose in its lowered 1 to get good posterior attention.  Once this was done, attention was turned to the tibia, which was sized to a 4, it was drilled and keeled and the femur had chamfers and box cut prior to that.  At this point, attention was turned to the patella and it was cut down to a level of 13 mm, and  a 41 paddle fits it and it was drilled and the lugs were drilled in the femur.  The trial components were all put in place at this point and the knee put through range of motion, excellent stability, range of motion, and gap balance.  Attention was then turned towards removal of the trial components.  The knee was then copiously and thoroughly lavaged, suctioned dry, and attention was then turned towards the removal of excess cement and once the cement allowed to harden, with the final component was cemented into place, size 4 femur, size 4 tibia, and 41 all poly patella, a 10-mm bridging bearing was placed and allowed the cement to harden.  All excess  bone cement was removed.  At this point, tourniquet let down.  All bleeding was controlled with electrocautery. At this point, Exparel was injected into the posterior capsule medially and laterally and then in the soft tissues underneath the quad tendon. The quad tendon was closed with a 1 Vicryl running over a medium Hemovac drain.  The skin was then closed after injection of more Exparel in the subcutaneous tissues with 0 and 2-0 Vicryl, and 3-0 Monocryl subcuticular.  Benzoin and Steri-Strips was applied.  Sterile compressive dressings applied.  The patient was taken to the recovery room and was noted to be in satisfactory condition.  Estimated blood loss for the procedure was none.     Harvie Junior, M.D.     Ranae Plumber  D:  10/16/2012  T:  10/17/2012  Job:  [id number]

## 2012-10-17 NOTE — Evaluation (Signed)
Physical Therapy Evaluation Patient Details Name: Omar [name] MRN: [id number] DOB: January 29, 1956 Today's Date: 10/17/2012 Time: 2130-8657 PT Time Calculation (min): 23 min  PT Assessment / Plan / Recommendation Clinical Impression  Pt is a 57 y.o. male s/p L TKA. Pt limited in therapy today secondary to pain; c/o 9/10 pain with amb. Pt presents with decreased strength and ROM in L LE; limited mobility and decreased independence with transfers. Pt to benefit from skilled PT to maximize functional mobility in acute setting. Plan for HHPT and 24/7 assistance by family upon D/C.     PT Assessment  Patient needs continued PT services    Follow Up Recommendations  Home health PT;Supervision/Assistance - 24 hour    Does the patient have the potential to tolerate intense rehabilitation      Barriers to Discharge        Equipment Recommendations  None recommended by PT (all DME has been delievered)    Recommendations for Other Services OT consult   Frequency 7X/week    Precautions / Restrictions Precautions Precautions: Knee;Fall Precaution Booklet Issued: Yes (comment) Required Braces or Orthoses: Knee Immobilizer - Left Knee Immobilizer - Left: Discontinue once straight leg raise with < 10 degree lag Restrictions Weight Bearing Restrictions: Yes LLE Weight Bearing: Weight bearing as tolerated   Pertinent Vitals/Pain 9/10 with movement; 6/10 at  Rest. Pt repositioned in chair.       Mobility  Bed Mobility Bed Mobility: Supine to Sit;Sitting - Scoot to Edge of Bed Supine to Sit: 4: Min assist;With rails;HOB elevated Sitting - Scoot to [place] of Bed: 4: Min assist Details for Bed Mobility Assistance: min (A) for bed mobility to advance L LE off EOB secondary to pain  Transfers Transfers: Sit to Stand;Stand to Sit Sit to Stand: 4: Min guard;From bed;With upper extremity assist Stand to Sit: 4: Min guard;With armrests;With upper extremity assist;To chair/3-in-1 Details for  Transfer Assistance: cues for hand placement and safety with RW Ambulation/Gait Ambulation/Gait Assistance: 4: Min guard Ambulation Distance (Feet): 30 Feet Assistive device: Rolling walker Ambulation/Gait Assistance Details: required cues to equally weight shift and to increase step length on R LE and increase heel strike on L LE; pt c/o 9/10 pain with amb; cues for RW safety and gt sequencing   Gait Pattern: Step-to pattern;Decreased step length - right;Decreased stance time - left;Wide base of support (L LE has tendency to ER; correctable with cues) Gait velocity: decreased Stairs: No Wheelchair Mobility Wheelchair Mobility: No    Exercises Total Joint Exercises Ankle Circles/Pumps: AROM;Both;10 reps;Supine Quad Sets: AROM;Left;Supine;10 reps Straight Leg Raises: AAROM;Left;10 reps;Supine   PT Diagnosis: Difficulty walking;Acute pain  PT Problem List: Decreased strength;Decreased range of motion;Decreased activity tolerance;Decreased knowledge of use of DME;Decreased knowledge of precautions;Pain;Decreased mobility PT Treatment Interventions: DME instruction;Gait training;Stair training;Functional mobility training;Therapeutic activities;Therapeutic exercise;Balance training;Neuromuscular re-education;Patient/family education   PT Goals Acute Rehab PT Goals PT Goal Formulation: With patient Time For Goal Achievement: 10/22/12 Potential to Achieve Goals: Good Pt will go Sit to Supine/Side: with modified independence PT Goal: Sit to Supine/Side - Progress: Goal set today Pt will go Sit to Stand: with modified independence PT Goal: Sit to Stand - Progress: Goal set today Pt will go Stand to Sit: with modified independence PT Goal: Stand to Sit - Progress: Goal set today Pt will Transfer Bed to Chair/Chair to Bed: with modified independence PT Transfer Goal: Bed to Chair/Chair to Bed - Progress: Goal set today Pt will Ambulate: >150 feet;with modified independence;with rolling  walker  PT Goal: Ambulate - Progress: Goal set today Pt will Go Up / Down Stairs: 3-5 stairs;with supervision;with rail(s) PT Goal: Up/Down Stairs - Progress: Goal set today Pt will Perform Home Exercise Program: Independently PT Goal: Perform Home Exercise Program - Progress: Goal set today  Visit Information  Last PT Received On: 10/17/12 Assistance Needed: +1    Subjective Data  Subjective: " i am more with it now,. I would like to get up and go to the bathroom." Patient Stated Goal: to go home tomorrow   Prior Functioning  Home Living Lives With: Spouse Available Help at Discharge: Family;Available 24 hours/day Type of Home: House Home Access: Stairs to enter [employer] of Steps: 1 (threshold) Entrance Stairs-Rails: None Home Layout: Multi-level Alternate Level Stairs-Number of Steps: 5 Alternate Level Stairs-Rails: Can reach both Bathroom Shower/Tub: Health visitor: Standard Bathroom Accessibility: Yes How Accessible: Accessible via walker Home Adaptive Equipment: Crutches Additional Comments: RW and 3 in 1 have been delievered Prior Function Level of Independence: Independent with assistive device(s) (used crutches last couple weeks secondary to pain) Able to Take Stairs?: Yes Driving: Yes Vocation: Full time employment Communication Communication: No difficulties Dominant Hand: Right    Cognition  Cognition Overall Cognitive Status: Appears within functional limits for tasks assessed/performed Arousal/Alertness: Awake/alert Orientation Level: Appears intact for tasks assessed Behavior During Session: [facility] for tasks performed    Extremity/Trunk Assessment Right Lower Extremity Assessment RLE ROM/Strength/Tone: Within functional levels RLE Sensation: WFL - Light Touch Left Lower Extremity Assessment LLE ROM/Strength/Tone: Deficits;Unable to fully assess;Due to pain LLE ROM/Strength/Tone Deficits: DF/PF WFL; L Knee unable to assess  secondary to pain and surgery (AROM L knee 4 to 40 PROM 2 to 50 in supine; limited by pain ) LLE Sensation: WFL - Light Touch Trunk Assessment Trunk Assessment: Normal   Balance Balance Balance Assessed: No  End of Session PT - End of Session Equipment Utilized During Treatment: Gait belt;Left knee immobilizer Activity Tolerance: Patient limited by pain Patient left: in chair;with call bell/phone within reach;with family/visitor present Nurse Communication: Mobility status CPM Left Knee CPM Left Knee: [address]  GP     Donnamarie Poag [place], [place] [phone] 10/17/2012, 11:48 AM

## 2012-10-17 NOTE — Progress Notes (Signed)
Physical Therapy Treatment Patient Details Name: Omar [name] MRN: [id number] DOB: Jul 18, 1955 Today's Date: 10/17/2012 Time: 5784-6962 PT Time Calculation (min): 27 min  PT Assessment / Plan / Recommendation Comments on Treatment Session  Pt moving better this afternoon; pain seems controlled. Pt c/o pain with activity 4/10; mainly contributed pain to L KNEE immobilizer. Anticipate D/C home tomorrow; plan to address stair ambulation goal in morning session.     Follow Up Recommendations  Home health PT;Supervision/Assistance - 24 hour     Does the patient have the potential to tolerate intense rehabilitation     Barriers to Discharge        Equipment Recommendations  None recommended by PT    Recommendations for Other Services OT consult  Frequency 7X/week   Plan Discharge plan remains appropriate;Frequency remains appropriate    Precautions / Restrictions Precautions Precautions: Knee;Fall Precaution Booklet Issued: Yes (comment) Required Braces or Orthoses: Knee Immobilizer - Left Knee Immobilizer - Left: Discontinue once straight leg raise with < 10 degree lag Restrictions Weight Bearing Restrictions: Yes LLE Weight Bearing: Weight bearing as tolerated   Pertinent Vitals/Pain 4/10 with activity; pt premedicated; repositioned in bed for comfort.     Mobility  Bed Mobility Bed Mobility: Supine to Sit;Sit to Supine;Sitting - Scoot to Edge of Bed Supine to Sit: With rails;HOB elevated;5: Supervision Sitting - Scoot to Edge of Bed: 5: Supervision;With rail Sit to Supine: 5: Set up;With rail;HOB elevated Details for Bed Mobility Assistance: vc's for using UEs and/or R LE to assist L LE onto/off EOB; pt able to perform bed mobility with rails and increased time with self assist technique Transfers Transfers: Sit to Stand;Stand to Sit Sit to Stand: 5: Supervision;From bed Stand to Sit: 5: Supervision;To bed Details for Transfer Assistance: Pt demo good technique with RW  and hand placement with transfer  Ambulation/Gait Ambulation/Gait Assistance: 5: Supervision Ambulation Distance (Feet): 100 Feet Assistive device: Rolling walker Ambulation/Gait Assistance Details: vc's for gt sequencing and RW; vc's for upright posture and to equalize weightshift  Gait Pattern: Step-to pattern;Decreased step length - right;Decreased stance time - left;Wide base of support Gait velocity: decreased Stairs: No Wheelchair Mobility Wheelchair Mobility: No    Exercises Total Joint Exercises Ankle Circles/Pumps: AROM;Both;10 reps;Supine Quad Sets: AROM;Left;Supine;10 reps Short Arc QuadBarbaraann [name];Left;10 reps;Supine Heel Slides: AAROM;Left;10 reps;Seated Hip ABduction/ADduction: AAROM;Left;10 reps;Supine Straight Leg Raises: AAROM;Left;10 reps;Supine Long Arc Quad: AROM;10 reps;Left;Seated Other Exercises Other Exercises: PROM 0 to 90 degrees in sitting    PT Diagnosis: Difficulty walking;Acute pain  PT Problem List: Decreased strength;Decreased range of motion;Decreased activity tolerance;Decreased knowledge of use of DME;Decreased knowledge of precautions;Pain;Decreased mobility PT Treatment Interventions: DME instruction;Gait training;Stair training;Functional mobility training;Therapeutic activities;Therapeutic exercise;Balance training;Neuromuscular re-education;Patient/family education   PT Goals Acute Rehab PT Goals PT Goal Formulation: With patient Time For Goal Achievement: 10/22/12 Potential to Achieve Goals: Good Pt will go Sit to Supine/Side: with modified independence PT Goal: Sit to Supine/Side - Progress: Progressing toward goal Pt will go Sit to Stand: with modified independence PT Goal: Sit to Stand - Progress: Progressing toward goal Pt will go Stand to Sit: with modified independence PT Goal: Stand to Sit - Progress: Progressing toward goal Pt will Transfer Bed to Chair/Chair to Bed: with modified independence PT Transfer Goal: Bed to Chair/Chair to  Bed - Progress: Progressing toward goal Pt will Ambulate: >150 feet;with modified independence;with rolling walker PT Goal: Ambulate - Progress: Progressing toward goal Pt will Go Up / Down Stairs: 3-5 stairs;with supervision;with rail(s) PT Goal:  Up/Down Stairs - Progress: Goal set today Pt will Perform Home Exercise Program: Independently PT Goal: Perform Home Exercise Program - Progress: Progressing toward goal  Visit Information  Last PT Received On: 10/17/12 Assistance Needed: +1    Subjective Data  Subjective: "i am feeling better this afternoon' Patient Stated Goal: to go home tomorrow   Cognition  Cognition Overall Cognitive Status: Appears within functional limits for tasks assessed/performed Arousal/Alertness: Awake/alert Orientation Level: Appears intact for tasks assessed Behavior During Session: Omar [name] for tasks performed    Balance  Balance Balance Assessed: No  End of Session PT - End of Session Equipment Utilized During Treatment: Gait belt;Left knee immobilizer Activity Tolerance: Patient tolerated treatment well Patient left: in bed;in CPM;with call bell/phone within reach;with family/visitor present Nurse Communication: Mobility status CPM Left Knee CPM Left Knee: [address]   Omar [name]     Omar [name] Omar [name], Omar [name] [phone] 10/17/2012, 3:14 PM

## 2012-10-17 NOTE — Progress Notes (Signed)
Subjective: 1 Day Post-Op Procedure(s) (LRB): LEFT COMPUTER ASSISTED TOTAL KNEE ARTHROPLASTY (Left) Patient reports pain as 7 on 0-10 scale.   C/o headache. Foley out this am.  Objective: Vital signs in last 24 hours: Temp:  [97.6 F (36.4 C)-98.8 F (37.1 C)] 98 F (36.7 C) (04/15 0836) Pulse Rate:  [69-98] 76 (04/15 0836) Resp:  [14-24] 20 (04/15 0836) BP: (118-163)/(68-95) 129/80 mmHg (04/15 0836) SpO2:  [89 %-100 %] 94 % (04/15 0842)  Intake/Output from previous day: 04/14 0701 - 04/15 0700 In: 2667.5 [I.V.:2517.5; IV Piggyback:150] Out: 2175 [Urine:1300; Drains:875] Intake/Output this shift:     Recent Labs  10/17/12 0553  HGB 15.4    Recent Labs  10/17/12 0553  WBC 10.0  RBC 5.16  HCT 45.2  PLT 179    Recent Labs  10/17/12 0553  NA 138  K 3.9  CL 103  CO2 27  BUN 8  CREATININE 0.88  GLUCOSE 123*  CALCIUM 8.3*   No results found for this basename: LABPT, INR,  in the last 72 hours  Neurovascular intact Sensation intact distally Intact pulses distally Dorsiflexion/Plantar flexion intact Incision: dressing C/D/I Compartment soft  Assessment/Plan: 1 Day Post-Op Procedure(s) (LRB): LEFT COMPUTER ASSISTED TOTAL KNEE ARTHROPLASTY (Left) Plan: Up with therapy IV Toradol Start po oxycontin for pain control Leave drain till tomorrow ASA for VTE with scds Caidynce Muzyka,Yeng G 10/17/2012, 9:20 AM

## 2012-10-17 NOTE — Progress Notes (Signed)
Patient complaints of sever pain at the began of shift. Reported from night shift RN pain medications given at aprox. 6 am this morning. PA notified and orders received. Toradol 30 ml IV given at 0836 for pain level 10/10 on a scale of 0-10. Patient O2 saturation decreased also at 89%. Applied O2 at 2 l/min and O2 saturation increased to 94%. Patient fell asleep shortly after administration of Toradol. Awoke patient for schedule daily medications at 1015, patient still reports pain at a high level of 9/10, scheduled Oxy 12 hr tablet 20 mg given. Will continue to monitor patient.

## 2012-10-17 NOTE — Care Management Note (Signed)
CARE MANAGEMENT NOTE 10/17/2012  Patient:  Omar [name], Omar [name]   Account Number:  [id number]  Date Initiated:  10/17/2012  Documentation initiated by:  Vance Peper  Subjective/Objective Assessment:   57 yr old male s/p left total knee arthroplasty.     Action/Plan:   CM spoke with patient concerning home health and DME needs at discharge. Choice offered. Patient preoperatively setup with [facility], no changes. Has rolling walker, 3in1. CPM to be delivered to patient's home.   Anticipated DC Date:  10/18/2012   Anticipated DC Plan:  HOME W HOME HEALTH SERVICES      DC Planning Services  CM consult      [facility] Choice  HOME HEALTH   Choice offered to / List presented to:  C-1 Patient   DME arranged  CPM      DME agency  [employer]     HH arranged  HH-2 PT      HH agency  [facility]   Status of service:  Completed, signed off Medicare Important Message given?   (If response is "NO", the following Medicare IM given date fields will be blank) Date Medicare IM given:   Date Additional Medicare IM given:    Discharge Disposition:  HOME W HOME HEALTH SERVICES  Per UR Regulation:    If discussed at Long Length of Stay Meetings, dates discussed:    Comments:

## 2012-10-18 ENCOUNTER — Encounter (HOSPITAL_COMMUNITY): Payer: Self-pay | Admitting: General Practice

## 2012-10-18 LAB — CBC
HCT: 41.9 % (ref 39.0–52.0)
Hemoglobin: 14.1 g/dL (ref 13.0–17.0)
MCH: 29.9 pg (ref 26.0–34.0)
MCHC: 33.7 g/dL (ref 30.0–36.0)
MCV: 89 fL (ref 78.0–100.0)
Platelets: 169 10*3/uL (ref 150–400)
RBC: 4.71 MIL/uL (ref 4.22–5.81)
RDW: 14.1 % (ref 11.5–15.5)
WBC: 7.7 10*3/uL (ref 4.0–10.5)

## 2012-10-18 MED ORDER — OXYCODONE HCL ER 10 MG PO T12A
20.0000 mg | EXTENDED_RELEASE_TABLET | Freq: Two times a day (BID) | ORAL | Status: DC
  Administered 2012-10-18: 20 mg via ORAL

## 2012-10-18 MED ORDER — OXYCODONE-ACETAMINOPHEN 10-325 MG PO TABS: 1.0000 | ORAL_TABLET | ORAL | Status: DC | PRN

## 2012-10-18 MED ORDER — OXYCODONE HCL ER 20 MG PO T12A: 20.0000 mg | EXTENDED_RELEASE_TABLET | Freq: Two times a day (BID) | ORAL | Status: DC

## 2012-10-18 MED ORDER — ASPIRIN 325 MG PO TBEC: 325.0000 mg | DELAYED_RELEASE_TABLET | Freq: Two times a day (BID) | ORAL | Status: DC

## 2012-10-18 MED ORDER — METHOCARBAMOL 750 MG PO TABS: 750.0000 mg | ORAL_TABLET | Freq: Four times a day (QID) | ORAL | Status: DC

## 2012-10-18 NOTE — Progress Notes (Signed)
Physical Therapy Treatment Patient Details Name: Omar [name] MRN: [id number] DOB: [birth year] Today's Date: 10/18/2012 Time: 4098-1191 PT Time Calculation (min): 23 min  PT Assessment / Plan / Recommendation Comments on Treatment Session  Pt progressing towards goals today; hopes to D/C home once pain is under control. Pt able to increase amb and perform stair amb. Is clear from PT stand point to D/C home with family and HHPT.     Follow Up Recommendations  Home health PT;Supervision/Assistance - 24 hour     Does the patient have the potential to tolerate intense rehabilitation     Barriers to Discharge        Equipment Recommendations  None recommended by PT    Recommendations for Other Services    Frequency 7X/week   Plan Discharge plan remains appropriate;Frequency remains appropriate    Precautions / Restrictions Precautions Precautions: Knee;Fall Precaution Booklet Issued: Yes (comment) Required Braces or Orthoses: Knee Immobilizer - Left Knee Immobilizer - Left: Discontinue once straight leg raise with < 10 degree lag Restrictions Weight Bearing Restrictions: Yes LLE Weight Bearing: Weight bearing as tolerated   Pertinent Vitals/Pain 9/10 with activity; pt premedicated and repositioned for comfort.     Mobility  Bed Mobility Bed Mobility: Supine to Sit;Sitting - Scoot to Edge of Bed Supine to Sit: 4: Min assist;HOB flat;With rails Sitting - Scoot to Edge of Bed: 5: Supervision;With rail Details for Bed Mobility Assistance: required (A) to advance L LE  to/and off EOB; secondary to pain  Transfers Transfers: Sit to Stand;Stand to Sit Sit to Stand: 5: Supervision;From bed Stand to Sit: 6: Modified independent (Device/Increase time);To chair/3-in-1;With armrests Details for Transfer Assistance: pt demo good technique and safety with RW Ambulation/Gait Ambulation/Gait Assistance: 5: Supervision Ambulation Distance (Feet): 250 Feet Assistive device: Rolling  walker Ambulation/Gait Assistance Details: vc's to increase L heel strike and equalize stride length; pt cont to amb with antalgic gt secondary to pain in L LE; c/o 9/10 pain in L knee with amb Gait Pattern: Step-to pattern;Antalgic Gait velocity: decreased  Stairs: Yes Stairs Assistance: 5: Supervision Stairs Assistance Details (indicate cue type and reason): cues for gt sequencing and hand placement with stair amb Stair Management Technique: Step to pattern;One rail Right;Forwards Number of Stairs: 2 Wheelchair Mobility Wheelchair Mobility: No    Exercises     PT Diagnosis:    PT Problem List:   PT Treatment Interventions:     PT Goals Acute Rehab PT Goals PT Goal Formulation: With patient Time For Goal Achievement: 10/22/12 Potential to Achieve Goals: Good PT Goal: Sit to Supine/Side - Progress: Progressing toward goal PT Goal: Sit to Stand - Progress: Progressing toward goal PT Goal: Stand to Sit - Progress: Progressing toward goal PT Transfer Goal: Bed to Chair/Chair to Bed - Progress: Progressing toward goal PT Goal: Ambulate - Progress: Progressing toward goal PT Goal: Up/Down Stairs - Progress: Progressing toward goal  Visit Information  Last PT Received On: 10/18/12 Assistance Needed: +1    Subjective Data  Subjective: "I am not going home until this pain is handled"  Patient Stated Goal: to not have as much pain    Cognition  Cognition Arousal/Alertness: Awake/alert Behavior During Therapy: WFL for tasks assessed/performed Overall Cognitive Status: Within Functional Limits for tasks assessed    Balance  Balance Balance Assessed: No  End of Session PT - End of Session Equipment Utilized During Treatment: Gait belt;Left knee immobilizer Activity Tolerance: Patient tolerated treatment well Patient left: in chair;with call bell/phone within  reach Nurse Communication: Mobility status CPM Left Knee CPM Left Knee: [address]   GP     Donnamarie Poag [place],  [place] [phone] 10/18/2012, 11:54 AM

## 2012-10-18 NOTE — Progress Notes (Signed)
Patient discharged in stable condition via wheelchair to home. Discharge instructions and prescriptions were given and explained. 

## 2012-10-18 NOTE — Progress Notes (Signed)
Subjective: 2 Days Post-Op Procedure(s) (LRB): LEFT COMPUTER ASSISTED TOTAL KNEE ARTHROPLASTY (Left) Patient reports pain as moderate.    Objective: Vital signs in last 24 hours: Temp:  [98 F (36.7 C)-98.7 F (37.1 C)] 98.7 F (37.1 C) (04/15 2207) Pulse Rate:  [76-97] 90 (04/16 0611) Resp:  [18-20] 18 (04/16 0611) BP: (118-129)/(76-83) 128/79 mmHg (04/16 0611) SpO2:  [89 %-97 %] 97 % (04/16 0611)  Intake/Output from previous day: 04/15 0701 - 04/16 0700 In: 1200 [P.O.:1200] Out: 750 [Urine:400; Drains:350] Intake/Output this shift:     Recent Labs  10/17/12 0553 10/18/12 0557  HGB 15.4 14.1    Recent Labs  10/17/12 0553 10/18/12 0557  WBC 10.0 7.7  RBC 5.16 4.71  HCT 45.2 41.9  PLT 179 169    Recent Labs  10/17/12 0553  NA 138  K 3.9  CL 103  CO2 27  BUN 8  CREATININE 0.88  GLUCOSE 123*  CALCIUM 8.3*   No results found for this basename: LABPT, INR,  in the last 72 hours  Neurologically intact ABD soft Neurovascular intact No cellulitis present Compartment soft  Assessment/Plan: 2 Days Post-Op Procedure(s) (LRB): LEFT COMPUTER ASSISTED TOTAL KNEE ARTHROPLASTY (Left) Advance diet Up with therapy Will add oxycontin and plan on d/c later today or tomorrow  Teliah Buffalo L 10/18/2012, 8:06 AM

## 2012-10-31 NOTE — Discharge Summary (Signed)
Patient ID: Omar [name] MRN: [id number] DOB/AGE: [birth year] 57 y.o.  Admit date: 10/16/2012 Discharge date: 10/18/12 Admission Diagnoses:  Principal Problem:   Osteoarthritis of left knee   Discharge Diagnoses:  Same  Past Medical History  Diagnosis Date  . Thyroid disease   . Arthritis   . GERD (gastroesophageal reflux disease)   . DJD (degenerative joint disease)   . Sleep apnea 2006    uses a cpap -desated to 55% during study  . Depression   . Complication of anesthesia     Pt states that he experienced some numbness on right side of tongue after surgery for 2 months; continues to experience gum sensitivity to date  . Hypothyroidism   . Headache     Sinus headaches  . Tinnitus of both ears     Hx: of    Surgeries: Procedure(s): LEFT COMPUTER ASSISTED TOTAL KNEE ARTHROPLASTY on 10/16/2012   Discharged Condition: Improved  Hospital Course: Omar [name] is an 57 y.o. male who was admitted 10/16/2012 for operative treatment ofOsteoarthritis of left knee. Patient has severe unremitting pain that affects sleep, daily activities, and work/hobbies. After pre-op clearance the patient was taken to the operating room on 10/16/2012 and underwent  Procedure(s): LEFT COMPUTER ASSISTED TOTAL KNEE ARTHROPLASTY.    Patient was given perioperative antibiotics:  Anti-infectives   Start     Dose/Rate Route Frequency Ordered Stop   10/16/12 2200  ceFAZolin (ANCEF) IVPB 2 g/50 mL premix     2 g 100 mL/hr over 30 Minutes Intravenous Every 6 hours 10/16/12 2003 10/17/12 0327   10/16/12 1608  cefUROXime (ZINACEF) injection  Status:  Discontinued       As needed 10/16/12 1609 10/16/12 1744   10/16/12 0600  ceFAZolin (ANCEF) IVPB 2 g/50 mL premix     2 g 100 mL/hr over 30 Minutes Intravenous On call to O.R. 10/15/12 1331 10/16/12 1446       Patient was given sequential compression devices, early ambulation, and chemoprophylaxis to prevent DVT.ASA 325mg  bid and SCD's were used for VTE  prophylaxis.  Patient benefited maximally from hospital stay and there were no complications.    Recent vital signs: see hospital chart   Recent laboratory studies: see hospital chart  Discharge Medications:     Medication List    TAKE these medications       aspirin 325 MG EC tablet  Take 1 tablet (325 mg total) by mouth 2 (two) times daily after a meal.     esomeprazole 40 MG capsule  Commonly known as:  NEXIUM  Take 40 mg by mouth 2 (two) times daily.     ezetimibe-simvastatin 10-40 MG per tablet  Commonly known as:  VYTORIN  Take 1 tablet by mouth at bedtime.     fish oil-omega-3 fatty acids 1000 MG capsule  Take 1 g by mouth 2 (two) times daily.     levothyroxine 150 MCG tablet  Commonly known as:  SYNTHROID, LEVOTHROID  Take 150 mcg by mouth daily.     loratadine-pseudoephedrine 5-120 MG per tablet  Commonly known as:  CLARITIN-D 12-hour  Take 1 tablet by mouth every morning.     methocarbamol 750 MG tablet  Commonly known as:  ROBAXIN-750  Take 1 tablet (750 mg total) by mouth 4 (four) times daily.     multivitamin with minerals Tabs  Take 1 tablet by mouth daily.     mupirocin ointment 2 %  Commonly known as:  BACTROBAN  Apply topically  2 (two) times daily. For positive PCR     OxyCODONE 20 mg T12a  Commonly known as:  OXYCONTIN  Take 1 tablet (20 mg total) by mouth every 12 (twelve) hours.     oxyCODONE-acetaminophen 10-325 MG per tablet  Commonly known as:  PERCOCET  Take 1-2 tablets by mouth every 4 (four) hours as needed for pain.     TESTOSTERONE IM  Inject into the muscle every 14 (fourteen) days.     Vitamin D3 5000 UNITS Caps  Take 1 capsule by mouth daily.        Diagnostic Studies: Dg Chest 2 View  10/12/2012  *RADIOLOGY REPORT*  Clinical Data: Preadmission respiratory films.  CHEST - 2 VIEW  Comparison: CT chest 10/06/2011 and PA and lateral chest 09/30/2011.  Findings: Lungs are clear.  Heart size normal.  No pneumothorax or pleural  effusion.  IMPRESSION: No acute disease.   Original Report Authenticated By: Holley Dexter, M.D.     Disposition: 06-Home-Health Care Svc      Discharge Orders   Future Orders Complete By Expires     CPM  As directed     Comments:      Continuous passive motion machine (CPM):      Use the CPM from 0 degrees  to 60 degrees for 8 hours per day.      You may increase by 10 degrees per day.  You may break it up into 2 or 3 sessions per day.      Use CPM for 1-2 weeks or until you are told to stop.    Call MD / Call 911  As directed     Comments:      If you experience chest pain or shortness of breath, CALL 911 and be transported to the hospital emergency room.  If you develope a fever above 101 F, pus (white drainage) or increased drainage or redness at the wound, or calf pain, call your surgeon's office.    Constipation Prevention  As directed     Comments:      Drink plenty of fluids.  Prune juice may be helpful.  You may use a stool softener, such as Colace (over the counter) 100 mg twice a day.  Use MiraLax (over the counter) for constipation as needed.    Diet general  As directed     Do not put a pillow under the knee. Place it under the heel.  As directed     Increase activity slowly as tolerated  As directed        Follow-up Information   Follow up with GRAVES,JOHN L, MD. Schedule an appointment as soon as possible for a visit in 2 weeks.   Contact information:   [address] [place] [address] [phone]        Signed: DERRIC, DEALMEIDA 10/31/2012, 8:53 AM

## 2013-04-20 ENCOUNTER — Ambulatory Visit (HOSPITAL_COMMUNITY)
Admission: RE | Admit: 2013-04-20 | Discharge: 2013-04-20 | Disposition: A | Discharge status: Home / Self Care | Payer: BC Managed Care – PPO | Source: Ambulatory Visit | Attending: Internal Medicine | Admitting: Internal Medicine

## 2013-04-20 ENCOUNTER — Other Ambulatory Visit (HOSPITAL_COMMUNITY): Payer: Self-pay | Admitting: Internal Medicine

## 2013-04-20 DIAGNOSIS — I10 Essential (primary) hypertension: Secondary | ICD-10-CM | POA: Insufficient documentation

## 2013-04-20 DIAGNOSIS — F17200 Nicotine dependence, unspecified, uncomplicated: Secondary | ICD-10-CM | POA: Insufficient documentation

## 2013-05-24 ENCOUNTER — Other Ambulatory Visit: Payer: Self-pay | Admitting: Internal Medicine

## 2013-05-24 DIAGNOSIS — J019 Acute sinusitis, unspecified: Secondary | ICD-10-CM

## 2013-05-24 MED ORDER — PREDNISONE 20 MG PO TABS: 20.0000 mg | ORAL_TABLET | ORAL | Status: DC

## 2013-05-24 MED ORDER — AZITHROMYCIN 250 MG PO TABS
ORAL_TABLET | ORAL | Status: AC
Start: 2013-05-24 — End: 2013-05-29

## 2013-06-15 ENCOUNTER — Other Ambulatory Visit: Payer: Self-pay | Admitting: Internal Medicine

## 2013-06-15 DIAGNOSIS — H5712 Ocular pain, left eye: Secondary | ICD-10-CM

## 2013-06-25 ENCOUNTER — Ambulatory Visit
Admission: RE | Admit: 2013-06-25 | Discharge: 2013-06-25 | Disposition: A | Discharge status: Home / Self Care | Payer: BC Managed Care – PPO | Source: Ambulatory Visit | Attending: Internal Medicine | Admitting: Internal Medicine

## 2013-06-25 ENCOUNTER — Other Ambulatory Visit: Payer: BC Managed Care – PPO

## 2013-06-25 ENCOUNTER — Other Ambulatory Visit: Payer: Self-pay | Admitting: Internal Medicine

## 2013-06-25 DIAGNOSIS — H5712 Ocular pain, left eye: Secondary | ICD-10-CM

## 2013-06-26 ENCOUNTER — Telehealth: Payer: Self-pay | Admitting: *Deleted

## 2013-06-26 NOTE — Telephone Encounter (Signed)
Message copied by Reggy Eye on Tue Jun 26, 2013  9:34 AM ------      Message from: Lucky Cowboy      Created: Mon Jun 25, 2013  8:57 PM       CTscan neg and normal - please send copy of report to patient - But if still having eye pain and redness -could be glaucoma and strongly suggest seeing an eye DR. - if no preference refer to Dr Emily Filbert [phone] ------

## 2013-07-23 DIAGNOSIS — I10 Essential (primary) hypertension: Secondary | ICD-10-CM | POA: Insufficient documentation

## 2013-07-23 DIAGNOSIS — Z9989 Dependence on other enabling machines and devices: Secondary | ICD-10-CM

## 2013-07-23 DIAGNOSIS — E559 Vitamin D deficiency, unspecified: Secondary | ICD-10-CM | POA: Insufficient documentation

## 2013-07-23 DIAGNOSIS — G4733 Obstructive sleep apnea (adult) (pediatric): Secondary | ICD-10-CM | POA: Insufficient documentation

## 2013-07-23 DIAGNOSIS — E785 Hyperlipidemia, unspecified: Secondary | ICD-10-CM | POA: Insufficient documentation

## 2013-07-23 DIAGNOSIS — E349 Endocrine disorder, unspecified: Secondary | ICD-10-CM | POA: Insufficient documentation

## 2013-07-23 DIAGNOSIS — E079 Disorder of thyroid, unspecified: Secondary | ICD-10-CM | POA: Insufficient documentation

## 2013-07-24 ENCOUNTER — Encounter: Payer: Self-pay | Admitting: Internal Medicine

## 2013-07-24 ENCOUNTER — Ambulatory Visit (INDEPENDENT_AMBULATORY_CARE_PROVIDER_SITE_OTHER): Payer: BC Managed Care – PPO | Admitting: Internal Medicine

## 2013-07-24 VITALS — BP 132/88 | HR 84 | Temp 99.0°F | Resp 16 | Wt 201.8 lb

## 2013-07-24 DIAGNOSIS — E559 Vitamin D deficiency, unspecified: Secondary | ICD-10-CM

## 2013-07-24 DIAGNOSIS — Z79899 Other long term (current) drug therapy: Secondary | ICD-10-CM

## 2013-07-24 DIAGNOSIS — E782 Mixed hyperlipidemia: Secondary | ICD-10-CM

## 2013-07-24 DIAGNOSIS — R7309 Other abnormal glucose: Secondary | ICD-10-CM

## 2013-07-24 DIAGNOSIS — I10 Essential (primary) hypertension: Secondary | ICD-10-CM

## 2013-07-24 LAB — CBC WITH DIFFERENTIAL/PLATELET
Basophils Absolute: 0.1 10*3/uL (ref 0.0–0.1)
Basophils Relative: 1 % (ref 0–1)
Eosinophils Absolute: 0.5 10*3/uL (ref 0.0–0.7)
Eosinophils Relative: 5 % (ref 0–5)
HCT: 52.4 % — ABNORMAL HIGH (ref 39.0–52.0)
Hemoglobin: 17.8 g/dL — ABNORMAL HIGH (ref 13.0–17.0)
Lymphocytes Relative: 43 % (ref 12–46)
Lymphs Abs: 4.5 10*3/uL — ABNORMAL HIGH (ref 0.7–4.0)
MCH: 29.4 pg (ref 26.0–34.0)
MCHC: 34 g/dL (ref 30.0–36.0)
MCV: 86.5 fL (ref 78.0–100.0)
Monocytes Absolute: 1 10*3/uL (ref 0.1–1.0)
Monocytes Relative: 9 % (ref 3–12)
Neutro Abs: 4.4 10*3/uL (ref 1.7–7.7)
Neutrophils Relative %: 42 % — ABNORMAL LOW (ref 43–77)
Platelets: 247 10*3/uL (ref 150–400)
RBC: 6.06 MIL/uL — ABNORMAL HIGH (ref 4.22–5.81)
RDW: 14.4 % (ref 11.5–15.5)
WBC: 10.4 10*3/uL (ref 4.0–10.5)

## 2013-07-24 MED ORDER — PREDNISONE 20 MG PO TABS: 20.0000 mg | ORAL_TABLET | ORAL | Status: DC

## 2013-07-24 MED ORDER — HYDROCODONE-ACETAMINOPHEN 5-325 MG PO TABS: ORAL_TABLET | ORAL | Status: DC

## 2013-07-24 MED ORDER — AZITHROMYCIN 250 MG PO TABS: ORAL_TABLET | ORAL | Status: AC

## 2013-07-24 NOTE — Progress Notes (Signed)
Patient ID: Omar [name], male   DOB: 03/28/1956, 58 y.o.   MRN: [id number]   This very nice 58 y.o. MWM presents for 3 month follow up with Hypertension, Hyperlipidemia, Pre-Diabetes and Vitamin D Deficiency.    HTN predates since 2008. BP has been controlled at home. Today's BP: 132/88 mmHg.  In 2011 he had a [place] with a negative and normal heart cath thru the [place]. Patient denies any cardiac type chest pain, palpitations, dyspnea/orthopnea/PND, dizziness, claudication, or dependent edema.   Hyperlipidemia is controlled with diet & meds. Last Cholesterol was  154, Triglycerides were 302, HDL 42 and LDL 52. Patient denies myalgias or other med SE's.    Also, the patient has history of PreDiabetes A21c 6.0% since 2008 with last A1c of 6.5% in Sept 2014. Patient denies any symptoms of reactive hypoglycemia, diabetic polys, paresthesias or visual blurring.   Further, Patient has history of Vitamin D Deficiency of 27 in 2008 with last vitamin D of 73 in July 2014. Patient supplements vitamin D without any suspected side-effects.    Medication List   aspirin 325 MG EC tablet  Take 1 tablet (325 mg total) by mouth 2 (two) times daily after a meal.     azithromycin 250 MG tablet  Commonly known as:  ZITHROMAX  Take 2 tablets (500 mg) on  Day 1,  followed by 1 tablet (250 mg) once daily on Days 2 through 5.     esomeprazole 40 MG capsule  Commonly known as:  NEXIUM  Take 40 mg by mouth 2 (two) times daily.     ezetimibe-simvastatin 10-40 MG per tablet  Commonly known as:  VYTORIN  Take 1 tablet by mouth at bedtime.     fish oil-omega-3 fatty acids 1000 MG capsule  Take 1 g by mouth 2 (two) times daily.     HYDROcodone-acetaminophen 5-325 MG per tablet  Commonly known as:  NORCO  1/2 to 1 tab every 3 to 4 hours as needed for cough or pain     levothyroxine 150 MCG tablet  Commonly known as:  SYNTHROID, LEVOTHROID  Take 150 mcg by mouth daily.     loratadine-pseudoephedrine 5-120 MG per tablet  Commonly known as:  CLARITIN-D 12-hour  Take 1 tablet by mouth every morning.     multivitamin with minerals Tabs tablet  Take 1 tablet by mouth daily.     predniSONE 20 MG tablet  Commonly known as:  DELTASONE  Take 1 tablet (20 mg total) by mouth See admin instructions. 1 tab 3 x day for 3 days, then 1 tab 2 x day for 3 days, then 1 tab 1 x day for 5 days     TESTOSTERONE IM  Inject into the muscle every 14 (fourteen) days.     Vitamin D3 5000 UNITS Caps  Take 1 capsule by mouth daily.         No Known Allergies  PMHx:   Past Medical History  Diagnosis Date  . GERD (gastroesophageal reflux disease)   . Sleep apnea 2006    uses a cpap -desated to 55% during study  . Depression   . Complication of anesthesia     Pt states that he experienced some numbness on right side of tongue after surgery for 2 months; continues to experience gum sensitivity to date  . Headache(784.0)     Sinus headaches  . Tinnitus of both ears     Hx: of  . Hypertension   .  Hyperlipidemia   . Arthritis   . DJD (degenerative joint disease)   . Thyroid disease   . Hypothyroidism   . Prediabetes   . Vitamin D deficiency   . Other testicular hypofunction     FHx:    Reviewed / unchanged  SHx:    Reviewed / unchanged  Systems Review: Constitutional: Denies fever, chills, wt changes, headaches, insomnia, fatigue, night sweats, change in appetite. Eyes: Denies redness, blurred vision, diplopia, discharge, itchy, watery eyes.  ENT: Denies post nasal drip, epistaxis, sore throat, earache, hearing loss, dental pain, tinnitus, vertigo, snoring. C/O sinus discharge, congestion & pain CV: Denies chest pain, palpitations, irregular heartbeat, syncope, dyspnea, diaphoresis, orthopnea, PND, claudication, edema. Respiratory: denies cough, dyspnea, DOE, pleurisy, hoarseness, laryngitis, wheezing.  Gastrointestinal: Denies dysphagia, odynophagia, heartburn,  reflux, water brash, abdominal pain or cramps, nausea, vomiting, bloating, diarrhea, constipation, hematemesis, melena, hematochezia,  or hemorrhoids. Genitourinary: Denies dysuria, frequency, urgency, nocturia, hesitancy, discharge, hematuria, flank pain. Musculoskeletal: Denies arthralgias, myalgias, stiffness, jt. swelling, pain, limp, strain/sprain.  Skin: Denies pruritus, rash, hives, warts, acne, eczema, change in skin lesion(s). Neuro: No weakness, tremor, incoordination, spasms, paresthesia, or pain. Psychiatric: Denies confusion, memory loss, or sensory loss. Endo: Denies change in weight, skin, hair change.  Heme/Lymph: No excessive bleeding, bruising, orenlarged lymph nodes.  BP: 132/88  Pulse: 84  Temp: 99 F (37.2 C)  Resp: 16    BMI     31.6 kg/(m^2)    Height       5\' 7"   Weight       201 lb 12.8 oz  On Exam:  Appears well nourished - in no distress. Eyes: PERRLA, EOMs, conjunctiva no swelling or erythema. Sinuses: No frontal/ but positive Lt maxillary tenderness. ENT/Mouth: EAC's clear, TM's nl w/o erythema, bulging. Nares clear w/o erythema, swelling, exudates. Oropharynx clear without erythema or exudates. Oral hygiene is good. Tongue normal, non obstructing. Hearing intact.  Neck: Supple. Thyroid nl. Car 2+/2+ without bruits, nodes or JVD. Chest: Respirations nl with BS clear & equal w/o rales, rhonchi, wheezing or stridor.  Cor: Heart sounds normal w/ regular rate and rhythm without sig. murmurs, gallops, clicks, or rubs. Peripheral pulses normal and equal  without edema.  Abdomen: Soft & bowel sounds normal. Non-tender w/o guarding, rebound, hernias, masses, or organomegaly.  Lymphatics: Unremarkable.  Musculoskeletal: Full ROM all peripheral extremities, joint stability, 5/5 strength, and normal gait.  Skin: Warm, dry without exposed rashes, lesions, ecchymosis apparent.  Neuro: Cranial nerves intact, reflexes equal bilaterally. Sensory-motor testing grossly  intact. Tendon reflexes grossly intact.  Pysch: Alert & oriented x 3. Insight and judgement nl & appropriate. No ideations.  Assessment and Plan:  1. Hypertension - Continue monitor blood pressure at home. Continue diet/meds same.  2. Hyperlipidemia - Continue diet/meds, exercise,& lifestyle modifications. Continue monitor periodic cholesterol/liver & renal functions   3. Pre-diabetes/Insulin Resistance - Continue diet, exercise, weight loss and  lifestyle modifications. Monitor appropriate labs.  4. Vitamin D Deficiency - Continue supplementation.  5. Sinusitis - Rx Z pak, Predisone taper, Norco for cough/pain  Recommended regular exercise, BP monitoring, weight control, and discussed med and SE's. Recommended labs to assess and monitor clinical status. Further disposition pending results of labs.

## 2013-07-24 NOTE — Patient Instructions (Signed)

## 2013-07-25 LAB — BASIC METABOLIC PANEL WITH GFR
BUN: 10 mg/dL (ref 6–23)
CO2: 25 mEq/L (ref 19–32)
Calcium: 9.4 mg/dL (ref 8.4–10.5)
Chloride: 103 mEq/L (ref 96–112)
Creat: 1.04 mg/dL (ref 0.50–1.35)
GFR, Est African American: >89 mL/min
GFR, Est Non African American: 80 mL/min
Glucose, Bld: 66 mg/dL — ABNORMAL LOW (ref 70–99)
Potassium: 4.1 mEq/L (ref 3.5–5.3)
Sodium: 140 mEq/L (ref 135–145)

## 2013-07-25 LAB — HEPATIC FUNCTION PANEL
ALT: 23 U/L (ref 0–53)
AST: 24 U/L (ref 0–37)
Albumin: 4.5 g/dL (ref 3.5–5.2)
Alkaline Phosphatase: 50 U/L (ref 39–117)
Bilirubin, Direct: 0.1 mg/dL (ref 0.0–0.3)
Indirect Bilirubin: 0.4 mg/dL (ref 0.0–0.9)
Total Bilirubin: 0.5 mg/dL (ref 0.3–1.2)
Total Protein: 6.5 g/dL (ref 6.0–8.3)

## 2013-07-25 LAB — LIPID PANEL
Cholesterol: 159 mg/dL (ref 0–200)
HDL: 44 mg/dL (ref >39)
LDL Cholesterol: 38 mg/dL (ref 0–99)
Total CHOL/HDL Ratio: 3.6 Ratio
Triglycerides: 386 mg/dL — ABNORMAL HIGH (ref <150)
VLDL: 77 mg/dL — ABNORMAL HIGH (ref 0–40)

## 2013-07-25 LAB — HEMOGLOBIN A1C
Hgb A1c MFr Bld: 6.9 % — ABNORMAL HIGH (ref <5.7)
Mean Plasma Glucose: 151 mg/dL — ABNORMAL HIGH (ref <117)

## 2013-07-25 LAB — INSULIN, FASTING: Insulin fasting, serum: 10 u[IU]/mL (ref 3–28)

## 2013-07-25 LAB — VITAMIN D 25 HYDROXY (VIT D DEFICIENCY, FRACTURES): Vit D, 25-Hydroxy: 73 ng/mL (ref 30–89)

## 2013-07-25 LAB — TSH: TSH: 4.211 u[IU]/mL (ref 0.350–4.500)

## 2013-07-25 LAB — MAGNESIUM: Magnesium: 1.9 mg/dL (ref 1.5–2.5)

## 2013-09-27 ENCOUNTER — Ambulatory Visit (INDEPENDENT_AMBULATORY_CARE_PROVIDER_SITE_OTHER): Payer: BC Managed Care – PPO | Admitting: Internal Medicine

## 2013-09-27 ENCOUNTER — Encounter: Payer: Self-pay | Admitting: Internal Medicine

## 2013-09-27 VITALS — BP 136/88 | HR 88 | Temp 98.2°F | Resp 18 | Ht 67.0 in | Wt 202.8 lb

## 2013-09-27 DIAGNOSIS — E559 Vitamin D deficiency, unspecified: Secondary | ICD-10-CM

## 2013-09-27 DIAGNOSIS — I10 Essential (primary) hypertension: Secondary | ICD-10-CM

## 2013-09-27 DIAGNOSIS — E785 Hyperlipidemia, unspecified: Secondary | ICD-10-CM

## 2013-09-27 DIAGNOSIS — R7303 Prediabetes: Secondary | ICD-10-CM

## 2013-09-27 DIAGNOSIS — Z79899 Other long term (current) drug therapy: Secondary | ICD-10-CM

## 2013-09-27 DIAGNOSIS — R7309 Other abnormal glucose: Secondary | ICD-10-CM

## 2013-09-27 NOTE — Progress Notes (Signed)
Patient ID: Omar [name], male   DOB: 06/28/1956, 58 y.o.   MRN: [id number]    This very nice 58 y.o. MWM presents for month follow up with Hypertension, Hyperlipidemia, Pre-Diabetes and Vitamin D Deficiency. Also,He presentys with c/o's of weight gain (1 # in last 2-3 mo) and bloating. He feels he's having abdominal distention- worse in the mornings an slightly improves by late afternoon. He does have prior Hx/o IBS and is under a lot of stress in his job    HTN predates since 2008. BP has been controlled at home. Today's BP: 136/88 mmHg. Patient denies any cardiac type chest pain, palpitations, dyspnea/orthopnea/PND, dizziness, claudication, or dependent edema.   Hyperlipidemia is controlled with diet & meds. Last Cholesterol was 159  , Triglycerides were 386, HDL 44 and LDL 38  In Jan 2014. Patient denies myalgias or other med SE's.    Also, the patient has history of PreDiabetes with 6.1% in 2008 and last A1c was 6.9% in Jan 2014. Patient denies any symptoms of reactive hypoglycemia, diabetic polys, paresthesias or visual blurring.   Further, Patient has history of Vitamin D Deficiency with last vitamin D of   . Patient supplements vitamin D without any suspected side-effects.  Medication Sig  . Cholecalciferol (VITAMIN D3) 5000 UNITS CAPS Take 1 capsule by mouth daily.  Marland Kitchen esomeprazole (NEXIUM) 40 MG capsule Take 40 mg by mouth 2 (two) times daily.  Marland Kitchen ezetimibe-simvastatin (VYTORIN) 10-40 MG per tablet Take 1 tablet by mouth at bedtime.  . fish oil-omega-3 fatty acids 1000 MG capsule Take 1 g by mouth 2 (two) times daily.   Marland Kitchen HYDROcodone-acetaminophen (NORCO) 5-325 MG p 1/2 to 1 tab every 3 to 4 hours as needed for cough or pain  . levothyroxine (SYNTHROID, LEVOTHROID) 150 MCG tablet Take 150 mcg by mouth daily.  Marland Kitchen loratadine-pseudoephedrine (CLARITIN-D 12-HOUR) Take 1 tablet by mouth every morning.   . Multiple Vitamin (MULTIVITAMIN WITH MINERALS) TABS Take 1 tablet by mouth daily.  .  TESTOSTERONE IM Inject into the muscle every 14 (fourteen) days.    No Known Allergies  PMHx:   Past Medical History  Diagnosis Date  . GERD (gastroesophageal reflux disease)   . Sleep apnea 2006    uses a cpap -desated to 55% during study  . Depression   . Complication of anesthesia     Pt states that he experienced some numbness on right side of tongue after surgery for 2 months; continues to experience gum sensitivity to date  . Headache(784.0)     Sinus headaches  . Tinnitus of both ears     Hx: of  . Hypertension   . Hyperlipidemia   . Arthritis   . DJD (degenerative joint disease)   . Thyroid disease   . Hypothyroidism   . Prediabetes   . Vitamin D deficiency   . Other testicular hypofunction     FHx:    Reviewed / unchanged  SHx:    Reviewed / unchanged   Systems Review: Constitutional: Denies fever, chills, wt changes, headaches, insomnia, fatigue, night sweats, change in appetite. Eyes: Denies redness, blurred vision, diplopia, discharge, itchy, watery eyes.  ENT: Denies discharge, congestion, post nasal drip, epistaxis, sore throat, earache, hearing loss, dental pain, tinnitus, vertigo, sinus pain, snoring.  CV: Denies chest pain, palpitations, irregular heartbeat, syncope, dyspnea, diaphoresis, orthopnea, PND, claudication, edema. Respiratory: denies cough, dyspnea, DOE, pleurisy, hoarseness, laryngitis, wheezing.  Gastrointestinal: Denies dysphagia, odynophagia, heartburn, reflux, water brash, abdominal pain or cramps, nausea,  vomiting, bloating, diarrhea, constipation, hematemesis, melena, hematochezia,  or hemorrhoids. Genitourinary: Denies dysuria, frequency, urgency, nocturia, hesitancy, discharge, hematuria, flank pain. Musculoskeletal: Denies arthralgias, myalgias, stiffness, jt. swelling, pain, limp, strain/sprain.  Skin: Denies pruritus, rash, hives, warts, acne, eczema, change in skin lesion(s). Neuro: No weakness, tremor, incoordination, spasms,  paresthesia, or pain. Psychiatric: Denies confusion, memory loss, or sensory loss. Endo: Denies change in weight, skin, hair change.  Heme/Lymph: No excessive bleeding, bruising, orenlarged lymph nodes.   Exam:  BP 136/88  Pulse 88  Temp(Src) 98.2 F (36.8 C)  Resp 18  Ht 5\' 7"  (1.702 m)  Wt 202 lb 12.8 oz (91.989 kg)  BMI 31.76 kg/m2  Appears well nourished - in no distress. Eyes: PERRLA, EOMs, conjunctiva no swelling or erythema. Sinuses: No frontal/maxillary tenderness ENT/Mouth: EAC's clear, TM's nl w/o erythema, bulging. Nares clear w/o erythema, swelling, exudates. Oropharynx clear without erythema or exudates. Oral hygiene is good. Tongue normal, non obstructing. Hearing intact.  Neck: Supple. Thyroid nl. Car 2+/2+ without bruits, nodes or JVD. Chest: Respirations nl with BS clear & equal w/o rales, rhonchi, wheezing or stridor.  Cor: Heart sounds normal w/ regular rate and rhythm without sig. murmurs, gallops, clicks, or rubs. Peripheral pulses normal and equal  without edema.  Abdomen: Soft & bowel sounds normal. Non-tender w/o guarding, rebound, hernias, masses, or organomegaly.  Lymphatics: Unremarkable.  Musculoskeletal: Full ROM all peripheral extremities, joint stability, 5/5 strength, and normal gait.  Skin: Warm, dry without exposed rashes, lesions, ecchymosis apparent.  Neuro: Cranial nerves intact, reflexes equal bilaterally. Sensory-motor testing grossly intact. Tendon reflexes grossly intact.  Pysch: Alert & oriented x 3. Insight and judgement nl & appropriate. No ideations.  Assessment and Plan:  1. Hypertension - Continue monitor blood pressure at home. Continue diet/meds same.  2. Hyperlipidemia - continue current meds pending labs.  3. T1 NIDDM - continue recommend prudent low glycemic diet, weight control, regular exercise, diabetic monitoring and periodic eye exams.  4. Vitamin D Deficiency - Continue supplementation.  5. Abdominal bloating & Hx/o of  IBS -0 consider  CTscan of Abd pending labs.  Recommended regular exercise, BP monitoring, weight control, and discussed med and SE's. Recommended labs to assess and monitor clinical status. Further disposition pending results of labs.

## 2013-09-27 NOTE — Patient Instructions (Signed)
Irritable Bowel Syndrome Irritable Bowel Syndrome (IBS) is caused by a disturbance of normal bowel function. Other terms used are spastic colon, mucous colitis, and irritable colon. It does not require surgery, nor does it lead to cancer. There is no cure for IBS. But with proper diet, stress reduction, and medication, you will find that your problems (symptoms) will gradually disappear or improve. IBS is a common digestive disorder. It usually appears in late adolescence or early adulthood. Women develop it twice as often as men. CAUSES  After food has been digested and absorbed in the small intestine, waste material is moved into the colon (large intestine). In the colon, water and salts are absorbed from the undigested products coming from the small intestine. The remaining residue, or fecal material, is held for elimination. Under normal circumstances, gentle, rhythmic contractions on the bowel walls push the fecal material along the colon towards the rectum. In IBS, however, these contractions are irregular and poorly coordinated. The fecal material is either retained too long, resulting in constipation, or expelled too soon, producing diarrhea. SYMPTOMS  The most common symptom of IBS is pain. It is typically in the lower left side of the belly (abdomen). But it may occur anywhere in the abdomen. It can be felt as heartburn, backache, or even as a dull pain in the arms or shoulders. The pain comes from excessive bowel-muscle spasms and from the buildup of gas and fecal material in the colon. This pain:  Can range from sharp belly (abdominal) cramps to a dull, continuous ache.  Usually worsens soon after eating.  Is typically relieved by having a bowel movement or passing gas. Abdominal pain is usually accompanied by constipation. But it may also produce diarrhea. The diarrhea typically occurs right after a meal or upon arising in the morning. The stools are typically soft and watery. They are  often flecked with secretions (mucus). Other symptoms of IBS include:  Bloating.  Loss of appetite.  Heartburn.  Feeling sick to your stomach (nausea).  Belching  Vomiting  Gas. IBS may also cause a number of symptoms that are unrelated to the digestive system:  Fatigue.  Headaches.  Anxiety  Shortness of breath  Difficulty in concentrating.  Dizziness. These symptoms tend to come and go. DIAGNOSIS  The symptoms of IBS closely mimic the symptoms of other, more serious digestive disorders. So your caregiver may wish to perform a variety of additional tests to exclude these disorders. He/she wants to be certain of learning what is wrong (diagnosis). The nature and purpose of each test will be explained to you. TREATMENT A number of medications are available to help correct bowel function and/or relieve bowel spasms and abdominal pain. Among the drugs available are:  Mild, non-irritating laxatives for severe constipation and to help restore normal bowel habits.  Specific anti-diarrheal medications to treat severe or prolonged diarrhea.  Anti-spasmodic agents to relieve intestinal cramps.  Your caregiver may also decide to treat you with a mild tranquilizer or sedative during unusually stressful periods in your life. The important thing to remember is that if any drug is prescribed for you, make sure that you take it exactly as directed. Make sure that your caregiver knows how well it worked for you. HOME CARE INSTRUCTIONS   Avoid foods that are high in fat or oils. Some examples [id number] cream, butter, frankfurters, sausage, and other fatty meats.  Avoid foods that have a laxative effect, such as fruit, fruit juice, and dairy products.  Cut out  carbonated drinks, chewing gum, and "gassy" foods, such as beans and cabbage. This may help relieve bloating and belching.  Bran taken with plenty of liquids may help relieve constipation.  Keep track of what foods seem to  trigger your symptoms.  Avoid emotionally charged situations or circumstances that produce anxiety.  Start or continue exercising.  Get plenty of rest and sleep. MAKE SURE YOU:   Understand these instructions.  Will watch your condition.  Will get help right away if you are not doing well or get worse. Document Released: 06/21/2005 Document Revised: 09/13/2011 Document Reviewed: 02/09/2008 [facility] Patient Information 2014 [place].    Hypertension As your heart beats, it forces blood through your arteries. This force is your blood pressure. If the pressure is too high, it is called hypertension (HTN) or high blood pressure. HTN is dangerous because you may have it and not know it. High blood pressure may mean that your heart has to work harder to pump blood. Your arteries may be narrow or stiff. The extra work puts you at risk for heart disease, stroke, and other problems.  Blood pressure consists of two numbers, a higher number over a lower, 110/72, for example. It is stated as "110 over 72." The ideal is below 120 for the top number (systolic) and under 80 for the bottom (diastolic). Write down your blood pressure today. You should pay close attention to your blood pressure if you have certain conditions such as:  Heart failure.  Prior heart attack.  Diabetes  Chronic kidney disease.  Prior stroke.  Multiple risk factors for heart disease. To see if you have HTN, your blood pressure should be measured while you are seated with your arm held at the level of the heart. It should be measured at least twice. A one-time elevated blood pressure reading (especially in the Emergency Department) does not mean that you need treatment. There may be conditions in which the blood pressure is different between your right and left arms. It is important to see your caregiver soon for a recheck. Most people have essential hypertension which means that there is not a specific cause. This  type of high blood pressure may be lowered by changing lifestyle factors such as:  Stress.  Smoking.  Lack of exercise.  Excessive weight.  Drug/tobacco/alcohol use.  Eating less salt. Most people do not have symptoms from high blood pressure until it has caused damage to the body. Effective treatment can often prevent, delay or reduce that damage. TREATMENT  When a cause has been identified, treatment for high blood pressure is directed at the cause. There are a large number of medications to treat HTN. These fall into several categories, and your caregiver will help you select the medicines that are best for you. Medications may have side effects. You should review side effects with your caregiver. If your blood pressure stays high after you have made lifestyle changes or started on medicines,   Your medication(s) may need to be changed.  Other problems may need to be addressed.  Be certain you understand your prescriptions, and know how and when to take your medicine.  Be sure to follow up with your caregiver within the time frame advised (usually within two weeks) to have your blood pressure rechecked and to review your medications.  If you are taking more than one medicine to lower your blood pressure, make sure you know how and at what times they should be taken. Taking two medicines at the  same time can result in blood pressure that is too low. SEEK IMMEDIATE MEDICAL CARE IF:  You develop a severe headache, blurred or changing vision, or confusion.  You have unusual weakness or numbness, or a faint feeling.  You have severe chest or abdominal pain, vomiting, or breathing problems. MAKE SURE YOU:   Understand these instructions.  Will watch your condition.  Will get help right away if you are not doing well or get worse.   Diabetes and Exercise Exercising regularly is important. It is not just about losing weight. It has many health benefits, such as:  Improving  your overall fitness, flexibility, and endurance.  Increasing your bone density.  Helping with weight control.  Decreasing your body fat.  Increasing your muscle strength.  Reducing stress and tension.  Improving your overall health. People with diabetes who exercise gain additional benefits because exercise:  Reduces appetite.  Improves the body's use of blood sugar (glucose).  Helps lower or control blood glucose.  Decreases blood pressure.  Helps control blood lipids (such as cholesterol and triglycerides).  Improves the body's use of the hormone insulin by:  Increasing the body's insulin sensitivity.  Reducing the body's insulin needs.  Decreases the risk for heart disease because exercising:  Lowers cholesterol and triglycerides levels.  Increases the levels of good cholesterol (such as high-density lipoproteins [HDL]) in the body.  Lowers blood glucose levels. YOUR ACTIVITY PLAN  Choose an activity that you enjoy and set realistic goals. Your health care provider or diabetes educator can help you make an activity plan that works for you. You can break activities into 2 or 3 sessions throughout the day. Doing so is as good as one long session. Exercise ideas include:  Taking the dog for a walk.  Taking the stairs instead of the elevator.  Dancing to your favorite song.  Doing your favorite exercise with a friend. RECOMMENDATIONS FOR EXERCISING WITH TYPE 1 OR TYPE 2 DIABETES   Check your blood glucose before exercising. If blood glucose levels are greater than 240 mg/dL, check for urine ketones. Do not exercise if ketones are present.  Avoid injecting insulin into areas of the body that are going to be exercised. For example, avoid injecting insulin into:  The arms when playing tennis.  The legs when jogging.  Keep a record of:  Food intake before and after you exercise.  Expected peak times of insulin action.  Blood glucose levels before and after  you exercise.  The type and amount of exercise you have done.  Review your records with your health care provider. Your health care provider will help you to develop guidelines for adjusting food intake and insulin amounts before and after exercising.  If you take insulin or oral hypoglycemic agents, watch for signs and symptoms of hypoglycemia. They include:  Dizziness.  Shaking.  Sweating.  Chills.  Confusion.  Drink plenty of water while you exercise to prevent dehydration or heat stroke. Body water is lost during exercise and must be replaced.  Talk to your health care provider before starting an exercise program to make sure it is safe for you. Remember, almost any type of activity is better than none.    Cholesterol Cholesterol is a white, waxy, fat-like protein needed by your body in small amounts. The liver makes all the cholesterol you need. It is carried from the liver by the blood through the blood vessels. Deposits (plaque) may build up on blood vessel walls. This makes the arteries  narrower and stiffer. Plaque increases the risk for heart attack and stroke. You cannot feel your cholesterol level even if it is very high. The only way to know is by a blood test to check your lipid (fats) levels. Once you know your cholesterol levels, you should keep a record of the test results. Work with your caregiver to to keep your levels in the desired range. WHAT THE RESULTS MEAN:  Total cholesterol is a rough measure of all the cholesterol in your blood.  LDL is the so-called bad cholesterol. This is the type that deposits cholesterol in the walls of the arteries. You want this level to be low.  HDL is the good cholesterol because it cleans the arteries and carries the LDL away. You want this level to be high.  Triglycerides are fat that the body can either burn for energy or store. High levels are closely linked to heart disease. DESIRED LEVELS:  Total cholesterol below  200.  LDL below 100 for people at risk, below 70 for very high risk.  HDL above 50 is good, above 60 is best.  Triglycerides below 150. HOW TO LOWER YOUR CHOLESTEROL:  Diet.  Choose fish or white meat chicken and [place], roasted or baked. Limit fatty cuts of red meat, fried foods, and processed meats, such as sausage and lunch meat.  Eat lots of fresh fruits and vegetables. Choose whole grains, beans, pasta, potatoes and cereals.  Use only small amounts of olive, corn or canola oils. Avoid butter, mayonnaise, shortening or palm kernel oils. Avoid foods with trans-fats.  Use skim/nonfat milk and low-fat/nonfat yogurt and cheeses. Avoid whole milk, cream, ice cream, egg yolks and cheeses. Healthy desserts include angel food cake, ginger snaps, animal crackers, hard candy, popsicles, and low-fat/nonfat frozen yogurt. Avoid pastries, cakes, pies and cookies.  Exercise.  A regular program helps decrease LDL and raises HDL.  Helps with weight control.  Do things that increase your activity level like gardening, walking, or taking the stairs.  Medication.  May be prescribed by your caregiver to help lowering cholesterol and the risk for heart disease.  You may need medicine even if your levels are normal if you have several risk factors. HOME CARE INSTRUCTIONS   Follow your diet and exercise programs as suggested by your caregiver.  Take medications as directed.  Have blood work done when your caregiver feels it is necessary. MAKE SURE YOU:   Understand these instructions.  Will watch your condition.  Will get help right away if you are not doing well or get worse.      Vitamin D Deficiency Vitamin D is an important vitamin that your body needs. Having too little of it in your body is called a deficiency. A very bad deficiency can make your bones soft and can cause a condition called rickets.  Vitamin D is important to your body for different reasons, such as:   It  helps your body absorb 2 minerals called calcium and phosphorus.  It helps make your bones healthy.  It may prevent some diseases, such as diabetes and multiple sclerosis.  It helps your muscles and heart. You can get vitamin D in several ways. It is a natural part of some foods. The vitamin is also added to some dairy products and cereals. Some people take vitamin D supplements. Also, your body makes vitamin D when you are in the sun. It changes the sun's rays into a form of the vitamin that your body can use.  CAUSES   Not eating enough foods that contain vitamin D.  Not getting enough sunlight.  Having certain digestive system diseases that make it hard to absorb vitamin D. These diseases include Crohn's disease, chronic pancreatitis, and cystic fibrosis.  Having a surgery in which part of the stomach or small intestine is removed.  Being obese. Fat cells pull vitamin D out of your blood. That means that obese people may not have enough vitamin D left in their blood and in other body tissues.  Having chronic kidney or liver disease. RISK FACTORS Risk factors are things that make you more likely to develop a vitamin D deficiency. They include:  Being older.  Not being able to get outside very much.  Living in a nursing home.  Having had broken bones.  Having weak or thin bones (osteoporosis).  Having a disease or condition that changes how your body absorbs vitamin D.  Having dark skin.  Some medicines such as seizure medicines or steroids.  Being overweight or obese. SYMPTOMS Mild cases of vitamin D deficiency may not have any symptoms. If you have a very bad case, symptoms may include:  Bone pain.  Muscle pain.  Falling often.  Broken bones caused by a minor injury, due to osteoporosis. DIAGNOSIS A blood test is the best way to tell if you have a vitamin D deficiency. TREATMENT Vitamin D deficiency can be treated in different ways. Treatment for vitamin D  deficiency depends on what is causing it. Options include:  Taking vitamin D supplements.  Taking a calcium supplement. Your caregiver will suggest what dose is best for you. HOME CARE INSTRUCTIONS  Take any supplements that your caregiver prescribes. Follow the directions carefully. Take only the suggested amount.  Have your blood tested 2 months after you start taking supplements.  Eat foods that contain vitamin D. Healthy choices include:  Fortified dairy products, cereals, or juices. Fortified means vitamin D has been added to the food. Check the label on the package to be sure.  Fatty fish like salmon or trout.  Eggs.  Oysters.  Do not use a tanning bed.  Keep your weight at a healthy level. Lose weight if you need to.  Keep all follow-up appointments. Your caregiver will need to perform blood tests to make sure your vitamin D deficiency is going away. SEEK MEDICAL CARE IF:  You have any questions about your treatment.  You continue to have symptoms of vitamin D deficiency.  You have nausea or vomiting.  You are constipated.  You feel confused.  You have severe abdominal or back pain. MAKE SURE YOU:  Understand these instructions.  Will watch your condition.  Will get help right away if you are not doing well or get worse.

## 2013-09-28 LAB — HEPATIC FUNCTION PANEL
ALT: 25 U/L (ref 0–53)
AST: 21 U/L (ref 0–37)
Albumin: 4.1 g/dL (ref 3.5–5.2)
Alkaline Phosphatase: 47 U/L (ref 39–117)
Bilirubin, Direct: 0.1 mg/dL (ref 0.0–0.3)
Indirect Bilirubin: 0.4 mg/dL (ref 0.2–1.2)
Total Bilirubin: 0.5 mg/dL (ref 0.2–1.2)
Total Protein: 6.5 g/dL (ref 6.0–8.3)

## 2013-09-28 LAB — BASIC METABOLIC PANEL WITH GFR
BUN: 9 mg/dL (ref 6–23)
CO2: 28 mEq/L (ref 19–32)
Calcium: 9.4 mg/dL (ref 8.4–10.5)
Chloride: 104 mEq/L (ref 96–112)
Creat: 1.13 mg/dL (ref 0.50–1.35)
GFR, Est African American: 84 mL/min
GFR, Est Non African American: 72 mL/min
Glucose, Bld: 94 mg/dL (ref 70–99)
Potassium: 4.2 mEq/L (ref 3.5–5.3)
Sodium: 139 mEq/L (ref 135–145)

## 2013-09-28 LAB — HEMOGLOBIN A1C
Hgb A1c MFr Bld: 7.1 % — ABNORMAL HIGH (ref <5.7)
Mean Plasma Glucose: 157 mg/dL — ABNORMAL HIGH (ref <117)

## 2013-09-28 LAB — CBC WITH DIFFERENTIAL/PLATELET
Basophils Absolute: 0.1 10*3/uL (ref 0.0–0.1)
Basophils Relative: 1 % (ref 0–1)
Eosinophils Absolute: 0.4 10*3/uL (ref 0.0–0.7)
Eosinophils Relative: 5 % (ref 0–5)
HCT: 51.6 % (ref 39.0–52.0)
Hemoglobin: 17.6 g/dL — ABNORMAL HIGH (ref 13.0–17.0)
Lymphocytes Relative: 44 % (ref 12–46)
Lymphs Abs: 3.1 10*3/uL (ref 0.7–4.0)
MCH: 29.4 pg (ref 26.0–34.0)
MCHC: 34.1 g/dL (ref 30.0–36.0)
MCV: 86.1 fL (ref 78.0–100.0)
Monocytes Absolute: 0.5 10*3/uL (ref 0.1–1.0)
Monocytes Relative: 7 % (ref 3–12)
Neutro Abs: 3 10*3/uL (ref 1.7–7.7)
Neutrophils Relative %: 43 % (ref 43–77)
Platelets: 245 10*3/uL (ref 150–400)
RBC: 5.99 MIL/uL — ABNORMAL HIGH (ref 4.22–5.81)
RDW: 14.7 % (ref 11.5–15.5)
WBC: 7 10*3/uL (ref 4.0–10.5)

## 2013-09-28 LAB — TSH: TSH: 2.164 u[IU]/mL (ref 0.350–4.500)

## 2013-09-28 LAB — LIPID PANEL
Cholesterol: 177 mg/dL (ref 0–200)
HDL: 47 mg/dL (ref >39)
LDL Cholesterol: 75 mg/dL (ref 0–99)
Total CHOL/HDL Ratio: 3.8 Ratio
Triglycerides: 276 mg/dL — ABNORMAL HIGH (ref <150)
VLDL: 55 mg/dL — ABNORMAL HIGH (ref 0–40)

## 2013-09-28 LAB — INSULIN, FASTING: Insulin fasting, serum: 10 u[IU]/mL (ref 3–28)

## 2013-09-28 LAB — VITAMIN D 25 HYDROXY (VIT D DEFICIENCY, FRACTURES): Vit D, 25-Hydroxy: 91 ng/mL — ABNORMAL HIGH (ref 30–89)

## 2013-09-28 LAB — MAGNESIUM: Magnesium: 1.9 mg/dL (ref 1.5–2.5)

## 2013-10-23 ENCOUNTER — Encounter: Payer: Self-pay | Admitting: Internal Medicine

## 2013-10-23 ENCOUNTER — Ambulatory Visit (INDEPENDENT_AMBULATORY_CARE_PROVIDER_SITE_OTHER): Payer: BC Managed Care – PPO | Admitting: Internal Medicine

## 2013-10-23 VITALS — BP 126/80 | HR 84 | Temp 99.3°F | Resp 16 | Ht 67.0 in | Wt 200.2 lb

## 2013-10-23 DIAGNOSIS — E119 Type 2 diabetes mellitus without complications: Secondary | ICD-10-CM

## 2013-10-23 DIAGNOSIS — E669 Obesity, unspecified: Secondary | ICD-10-CM | POA: Insufficient documentation

## 2013-10-23 DIAGNOSIS — E1165 Type 2 diabetes mellitus with hyperglycemia: Secondary | ICD-10-CM

## 2013-10-23 DIAGNOSIS — E1129 Type 2 diabetes mellitus with other diabetic kidney complication: Secondary | ICD-10-CM | POA: Insufficient documentation

## 2013-10-23 DIAGNOSIS — IMO0001 Reserved for inherently not codable concepts without codable children: Secondary | ICD-10-CM

## 2013-10-23 DIAGNOSIS — E66811 Obesity, class 1: Secondary | ICD-10-CM | POA: Insufficient documentation

## 2013-10-23 DIAGNOSIS — D485 Neoplasm of uncertain behavior of skin: Secondary | ICD-10-CM

## 2013-10-23 NOTE — Progress Notes (Signed)
Subjective:    Patient ID: Omar [name], male    DOB: 09/06/1955, 58 y.o.   MRN: [id number]  HPI Patient returns today for F/U of his T2 NIDDM with A1c recently rising from 6.9 to 7.1% . In the las 3 weeks he has last about 2#.  He describes his downfall as excessive snacking at night. He does not seem to have a good understanding of the high caloric sources of the foods he is regularly eating in his diet. He doe not report any symptoms of diabetic polys, paresthesias, visual blurring or reactive hypoglycemic episodes. Current Outpatient Prescriptions on File Prior to Visit  Medication Sig Dispense Refill  . aspirin 81 MG tablet Take 81 mg by mouth daily.      . Cholecalciferol (VITAMIN D3) 5000 UNITS CAPS Take 1 capsule by mouth daily.      Marland Kitchen esomeprazole (NEXIUM) 40 MG capsule Take 40 mg by mouth 2 (two) times daily.      Marland Kitchen ezetimibe-simvastatin (VYTORIN) 10-40 MG per tablet Take 1 tablet by mouth at bedtime.      . finasteride (PROSCAR) 5 MG tablet Take 5 mg by mouth daily. Take 1/4 tablet daily.      . fish oil-omega-3 fatty acids 1000 MG capsule Take 1 g by mouth 2 (two) times daily.       Marland Kitchen levothyroxine (SYNTHROID, LEVOTHROID) 150 MCG tablet Take 150 mcg by mouth daily.      Marland Kitchen loratadine-pseudoephedrine (CLARITIN-D 12-HOUR) 5-120 MG per tablet Take 1 tablet by mouth every morning.       . Multiple Vitamin (MULTIVITAMIN WITH MINERALS) TABS Take 1 tablet by mouth daily.      . TESTOSTERONE IM Inject into the muscle every 14 (fourteen) days.       No current facility-administered medications on file prior to visit.   No Known Allergies Past Medical History  Diagnosis Date  . GERD (gastroesophageal reflux disease)   . Sleep apnea 2006    uses a cpap -desated to 55% during study  . Depression   . Complication of anesthesia     Pt states that he experienced some numbness on right side of tongue after surgery for 2 months; continues to experience gum sensitivity to date  .  Headache(784.0)     Sinus headaches  . Tinnitus of both ears     Hx: of  . Hypertension   . Hyperlipidemia   . Arthritis   . DJD (degenerative joint disease)   . Thyroid disease   . Hypothyroidism   . Prediabetes   . Vitamin D deficiency   . Other testicular hypofunction       Review of Systems In addition to the HPI above,  No Fever-chills,  No Headache, No changes with Vision or hearing,  No problems swallowing food or Liquids,  No Chest pain or productive Cough or Shortness of Breath,  No Abdominal pain, No Nausea or Vommitting, Bowel movements are regular,  No Blood in stool or Urine,  No dysuria,  He does have some raised  pink scaly areas over his vertex of his head. No new joints pains-aches,  No new weakness, tingling, numbness in any extremity,  No recent weight loss,  No polyuria, polydypsia or polyphagia,  No significant Mental Stressors.  A full 10 point Review of Systems was done, except as stated above, all other Review of Systems were negative  Objective:   Physical Exam  BP 126/80  Pulse 84  Temp(Src) 99.3  F (37.4 C) (Temporal)  Resp 16  Ht 5\' 7"  (1.702 m)  Wt 200 lb 3.2 oz (90.81 kg)  BMI 31.35 kg/m2  HEENT - Eac's patent. TM's Nl.EOM's full. PERRLA. NasoOroPharynx clear. Neck - supple. Nl Thyroid. No bruits nodes JVD Chest - Clear equal BS Cor - Nl HS. RRR w/o sig MGR. PP 1(+) No edema. Abd - No palpable organomegaly, masses or tenderness. BS nl. MS- FROM. w/o deformities. Muscle power tone and bulk Nl. Gait Nl. Neuro - No obvious Cr N abnormalities. Sensory, motor and Cerebellar functions appear Nl w/o focal abnormalities.  Assessment & Plan:   1. T2 NIDDM - poor control  - Hemoglobin A1c - Approx 20 minutes was spent in counseling patient in pathophysiology of insulin resistance and prediabetes and the 300% increased risk of Heart attacks, strokes, Cancer and Alzheimer type vascular dementia.  2. Neoplasm of uncertain behavior of  skin  ========================================================================  With informed consent the # 2 raised scaly areas of the vertex scalp area were treated with liquid Nitrogen by a double freeze thaw technique. Patient tolerated the procedure well.  Procedure: 1- 17000 Cryosurgery - Dx 238.2 - Skin lesion uncertain significance - Actinic keratosis suspect 2- 17003 Cryosurgery - Dx 238.2 - Skin lesion uncertain significance - Actinic keratosis suspect  Patient instructed expected of evolution odf vessicle formation and healing - Advised to return if lesion not resolve.

## 2013-10-24 LAB — HEMOGLOBIN A1C
Hgb A1c MFr Bld: 6.9 % — ABNORMAL HIGH (ref <5.7)
Mean Plasma Glucose: 151 mg/dL — ABNORMAL HIGH (ref <117)

## 2014-01-02 ENCOUNTER — Ambulatory Visit (INDEPENDENT_AMBULATORY_CARE_PROVIDER_SITE_OTHER): Payer: BC Managed Care – PPO | Admitting: Internal Medicine

## 2014-01-02 VITALS — BP 124/82 | HR 80 | Temp 98.4°F | Resp 16 | Ht 67.0 in | Wt 200.0 lb

## 2014-01-02 DIAGNOSIS — E785 Hyperlipidemia, unspecified: Secondary | ICD-10-CM

## 2014-01-02 DIAGNOSIS — E119 Type 2 diabetes mellitus without complications: Secondary | ICD-10-CM

## 2014-01-02 DIAGNOSIS — I10 Essential (primary) hypertension: Secondary | ICD-10-CM

## 2014-01-02 DIAGNOSIS — Z79899 Other long term (current) drug therapy: Secondary | ICD-10-CM

## 2014-01-02 DIAGNOSIS — E291 Testicular hypofunction: Secondary | ICD-10-CM

## 2014-01-02 DIAGNOSIS — E559 Vitamin D deficiency, unspecified: Secondary | ICD-10-CM

## 2014-01-02 LAB — CBC WITH DIFFERENTIAL/PLATELET
Basophils Absolute: 0.1 10*3/uL (ref 0.0–0.1)
Basophils Relative: 1 % (ref 0–1)
Eosinophils Absolute: 0.3 10*3/uL (ref 0.0–0.7)
Eosinophils Relative: 5 % (ref 0–5)
HCT: 51.4 % (ref 39.0–52.0)
Hemoglobin: 17.8 g/dL — ABNORMAL HIGH (ref 13.0–17.0)
Lymphocytes Relative: 47 % — ABNORMAL HIGH (ref 12–46)
Lymphs Abs: 3 10*3/uL (ref 0.7–4.0)
MCH: 29.6 pg (ref 26.0–34.0)
MCHC: 34.6 g/dL (ref 30.0–36.0)
MCV: 85.5 fL (ref 78.0–100.0)
Monocytes Absolute: 0.6 10*3/uL (ref 0.1–1.0)
Monocytes Relative: 9 % (ref 3–12)
Neutro Abs: 2.4 10*3/uL (ref 1.7–7.7)
Neutrophils Relative %: 38 % — ABNORMAL LOW (ref 43–77)
Platelets: 238 10*3/uL (ref 150–400)
RBC: 6.01 MIL/uL — ABNORMAL HIGH (ref 4.22–5.81)
RDW: 14.2 % (ref 11.5–15.5)
WBC: 6.3 10*3/uL (ref 4.0–10.5)

## 2014-01-02 LAB — HEMOGLOBIN A1C
Hgb A1c MFr Bld: 6.7 % — ABNORMAL HIGH (ref <5.7)
Mean Plasma Glucose: 146 mg/dL — ABNORMAL HIGH (ref <117)

## 2014-01-02 MED ORDER — TADALAFIL 20 MG PO TABS: ORAL_TABLET | ORAL | Status: DC

## 2014-01-02 NOTE — Patient Instructions (Signed)

## 2014-01-03 ENCOUNTER — Encounter: Payer: Self-pay | Admitting: Internal Medicine

## 2014-01-03 LAB — BASIC METABOLIC PANEL WITH GFR
BUN: 13 mg/dL (ref 6–23)
CO2: 24 mEq/L (ref 19–32)
Calcium: 9.2 mg/dL (ref 8.4–10.5)
Chloride: 103 mEq/L (ref 96–112)
Creat: 1.14 mg/dL (ref 0.50–1.35)
GFR, Est African American: 83 mL/min
GFR, Est Non African American: 71 mL/min
Glucose, Bld: 120 mg/dL — ABNORMAL HIGH (ref 70–99)
Potassium: 4.1 mEq/L (ref 3.5–5.3)
Sodium: 137 mEq/L (ref 135–145)

## 2014-01-03 LAB — LIPID PANEL
Cholesterol: 174 mg/dL (ref 0–200)
HDL: 39 mg/dL — ABNORMAL LOW (ref >39)
Total CHOL/HDL Ratio: 4.5 Ratio
Triglycerides: 483 mg/dL — ABNORMAL HIGH (ref <150)

## 2014-01-03 LAB — HEPATIC FUNCTION PANEL
ALT: 23 U/L (ref 0–53)
AST: 20 U/L (ref 0–37)
Albumin: 4.3 g/dL (ref 3.5–5.2)
Alkaline Phosphatase: 53 U/L (ref 39–117)
Bilirubin, Direct: 0.1 mg/dL (ref 0.0–0.3)
Indirect Bilirubin: 0.3 mg/dL (ref 0.2–1.2)
Total Bilirubin: 0.4 mg/dL (ref 0.2–1.2)
Total Protein: 6.5 g/dL (ref 6.0–8.3)

## 2014-01-03 LAB — MAGNESIUM: Magnesium: 2 mg/dL (ref 1.5–2.5)

## 2014-01-03 LAB — VITAMIN D 25 HYDROXY (VIT D DEFICIENCY, FRACTURES): Vit D, 25-Hydroxy: 81 ng/mL (ref 30–89)

## 2014-01-03 LAB — INSULIN, FASTING: Insulin fasting, serum: 57 u[IU]/mL — ABNORMAL HIGH (ref 3–28)

## 2014-01-03 LAB — TSH: TSH: 2.023 u[IU]/mL (ref 0.350–4.500)

## 2014-01-03 NOTE — Progress Notes (Signed)
Patient ID: Omar [name], male   DOB: 09-16-1955, 58 y.o.   MRN: [id number]   This very nice 58 y.o.MWM presents for 3 month follow up with Hypertension, Hyperlipidemia, T2_NIDDM, GERD, Testosterone and Vitamin D Deficiency. Other problems include OSA on CPAP and compensated Hypothyroidism s/p RAI tx of Graves Dz in the remote past.   HTN predates since 2008. BP has been controlled at home. Today's BP: 124/82 mmHg. Patient denies any cardiac type chest pain, palpitations, dyspnea/orthopnea/PND, dizziness, claudication, or dependent edema.   Hyperlipidemia is controlled with diet & meds. Patient denies myalgias or other med SE's. Last Lipids were   Lab Results  Component Value Date   CHOL 177 09/27/2013   HDL 47 09/27/2013   LDLCALC 75 09/27/2013   TRIG 276* 09/27/2013   CHOLHDL 3.8 09/27/2013    Also, the patient has history of T2_NIDDM since Aug 2008 with A1c 6.1% and last A1c was 6.7% today.  Patient is attempting to manage this with diet and weight control. Patient denies any symptoms of reactive hypoglycemia, diabetic polys, paresthesias or visual blurring.   Testosterone was low at 115 in 208 and  patient was started on replacement thearapy with improved sense of stamina and well- being. Further, Patient has history of Vitamin D Deficiency of 27 in 2008 and last vitamin D was 91 in Mar 2015.  Patient supplements vitamin D without any suspected side-effects.   Medication List   aspirin 81 MG tablet  Take 81 mg by mouth daily.     esomeprazole 40 MG capsule  Commonly known as:  NEXIUM  Take 40 mg by mouth 2 (two) times daily.     ezetimibe-simvastatin 10-40 MG per tablet  Commonly known as:  VYTORIN  Take 1 tablet by mouth at bedtime.     finasteride 5 MG tablet  Commonly known as:  PROSCAR  Take 5 mg by mouth daily. Take 1/4 tablet daily.     fish oil-omega-3 fatty acids 1000 MG capsule  Take 1 g by mouth 2 (two) times daily.     levothyroxine 150 MCG tablet  Commonly known as:   SYNTHROID, LEVOTHROID  Take 150 mcg by mouth daily.     loratadine-pseudoephedrine 5-120 MG per tablet  Commonly known as:  CLARITIN-D 12-hour  Take 1 tablet by mouth every morning.     multivitamin with minerals Tabs tablet  Take 1 tablet by mouth daily.     tadalafil 20 MG tablet  Commonly known as:  CIALIS  1/2 to 1 tablet every 2 to 3 days as needed for  XXXX     TESTOSTERONE IM  Inject into the muscle every 14 (fourteen) days.     Vitamin D3 5000 UNITS Caps  Take 1 capsule by mouth daily.     No Known Allergies  PMHx:   Past Medical History  Diagnosis Date  . GERD (gastroesophageal reflux disease)   . Sleep apnea 2006    uses a cpap -desated to 55% during study  . Depression   . Complication of anesthesia     Pt states that he experienced some numbness on right side of tongue after surgery for 2 months; continues to experience gum sensitivity to date  . Headache(784.0)     Sinus headaches  . Tinnitus of both ears     Hx: of  . Hypertension   . Hyperlipidemia   . Arthritis   . DJD (degenerative joint disease)   . Thyroid disease   . Hypothyroidism   .  Prediabetes   . Vitamin D deficiency   . Other testicular hypofunction    FHx:    Reviewed / unchanged  SHx:    Reviewed / unchanged  Systems Review:  Constitutional: Denies fever, chills, wt changes, headaches, insomnia, fatigue, night sweats, change in appetite. Eyes: Denies redness, blurred vision, diplopia, discharge, itchy, watery eyes.  ENT: Denies discharge, congestion, post nasal drip, epistaxis, sore throat, earache, hearing loss, dental pain, tinnitus, vertigo, sinus pain, snoring.  CV: Denies chest pain, palpitations, irregular heartbeat, syncope, dyspnea, diaphoresis, orthopnea, PND, claudication or edema. Respiratory: denies cough, dyspnea, DOE, pleurisy, hoarseness, laryngitis, wheezing.  Gastrointestinal: Denies dysphagia, odynophagia, heartburn, reflux, water brash, abdominal pain or cramps,  nausea, vomiting, bloating, diarrhea, constipation, hematemesis, melena, hematochezia  or hemorrhoids. Genitourinary: Denies dysuria, frequency, urgency, nocturia, hesitancy, discharge, hematuria or flank pain. Musculoskeletal: Denies arthralgias, myalgias, stiffness, jt. swelling, pain, limping or strain/sprain.  Skin: Denies pruritus, rash, hives, warts, acne, eczema or change in skin lesion(s). Neuro: No weakness, tremor, incoordination, spasms, paresthesia or pain. Psychiatric: Denies confusion, memory loss or sensory loss. Endo: Denies change in weight, skin or hair change.  Heme/Lymph: No excessive bleeding, bruising or enlarged lymph nodes.  Exam:  BP 124/82  P 80  T 98.4 F   R 16  Ht 5\' 7"    Wt 200 lb   BMI 31.32 kg/m2  Appears well nourished and in no distress. Eyes: PERRLA, EOMs, conjunctiva no swelling or erythema. Sinuses: No frontal/maxillary tenderness ENT/Mouth: EAC's clear, TM's nl w/o erythema, bulging. Nares clear w/o erythema, swelling, exudates. Oropharynx clear without erythema or exudates. Oral hygiene is good. Tongue normal, non obstructing. Hearing intact.  Neck: Supple. Thyroid nl. Car 2+/2+ without bruits, nodes or JVD. Chest: Respirations nl with BS clear & equal w/o rales, rhonchi, wheezing or stridor.  Cor: Heart sounds normal w/ regular rate and rhythm without sig. murmurs, gallops, clicks, or rubs. Peripheral pulses normal and equal  without edema.  Abdomen: Soft & bowel sounds normal. Non-tender w/o guarding, rebound, hernias, masses, or organomegaly.  Lymphatics: Unremarkable.  Musculoskeletal: Full ROM all peripheral extremities, joint stability, 5/5 strength, and normal gait.  Skin: Warm, dry without exposed rashes, lesions or ecchymosis apparent.  Neuro: Cranial nerves intact, reflexes equal bilaterally. Sensory-motor testing grossly intact. Tendon reflexes grossly intact.  Pysch: Alert & oriented x 3. Insight and judgement nl & appropriate. No  ideations.  Assessment and Plan:  1. Hypertension - Continue monitor blood pressure at home. Continue diet/meds same.  2. Hyperlipidemia - Continue diet/meds, exercise,& lifestyle modifications. Continue monitor periodic cholesterol/liver & renal functions   3. T2_NIDDM - continue recommend prudent low glycemic diet, weight control, regular exercise, diabetic monitoring and periodic eye exams.  4. Vitamin D Deficiency - Continue supplementation.  5. Testosterone Deficiency  6. GERD  Recommended regular exercise, BP monitoring, weight control, and discussed med and SE's. Recommended labs to assess and monitor clinical status. Further disposition pending results of labs.

## 2014-01-22 ENCOUNTER — Encounter: Payer: Self-pay | Admitting: Internal Medicine

## 2014-01-22 ENCOUNTER — Ambulatory Visit (INDEPENDENT_AMBULATORY_CARE_PROVIDER_SITE_OTHER): Payer: BC Managed Care – PPO | Admitting: Internal Medicine

## 2014-01-22 VITALS — BP 122/76 | HR 72 | Temp 97.9°F | Resp 16 | Ht 67.0 in | Wt 204.2 lb

## 2014-01-22 DIAGNOSIS — M5430 Sciatica, unspecified side: Secondary | ICD-10-CM

## 2014-01-22 DIAGNOSIS — M545 Low back pain, unspecified: Secondary | ICD-10-CM

## 2014-01-22 DIAGNOSIS — M5432 Sciatica, left side: Secondary | ICD-10-CM

## 2014-01-22 MED ORDER — METFORMIN HCL ER 500 MG PO TB24: ORAL_TABLET | ORAL | Status: DC

## 2014-01-22 MED ORDER — HYDROCODONE-ACETAMINOPHEN 5-325 MG PO TABS: ORAL_TABLET | ORAL | Status: DC

## 2014-01-22 MED ORDER — PREDNISONE 20 MG PO TABS: ORAL_TABLET | ORAL | Status: DC

## 2014-01-22 NOTE — Progress Notes (Deleted)
Patient ID: IBAN UTZ, male   DOB: Sep 29, 1955, 58 y.o.   MRN: [id number]

## 2014-01-22 NOTE — Patient Instructions (Signed)
Dr Lynann Bologna at Dr Berenice Primas' Office   ++++++++++++++++++++++++++++++++++++++++++  Recommend the book "The END of DIETING" by Dr Baker Janus   and the book "The END of DIABETES " by Dr Excell Seltzer  At [email or web address] - get book & Audio CD's      Being diabetic has a  300% increased risk for heart attack, stroke, cancer, and alzheimer- type vascular dementia. It is very important that you work harder with diet by avoiding all foods that are white except chicken & fish. Avoid white rice (brown & wild rice is OK), white potatoes (sweetpotatoes in moderation is OK), White bread or wheat bread or anything made out of white flour like bagels, donuts, rolls, buns, biscuits, cakes, pastries, cookies, pizza crust, and pasta (made from white flour & egg whites) - vegetarian pasta or spinach or wheat pasta is OK. Multigrain breads like Arnold's or [employer], or multigrain sandwich thins or flatbreads.  Diet, exercise and weight loss can reverse and cure diabetes in the early stages.  Diet, exercise and weight loss is very important in the control and prevention of complications of diabetes which affects every system in your body, ie. Brain - dementia/stroke, eyes - glaucoma/blindness, heart - heart attack/heart failure, kidneys - dialysis, stomach - gastric paralysis, intestines - malabsorption, nerves - severe painful neuritis, circulation - gangrene & loss of a leg(s), and finally cancer and Alzheimers.    I recommend avoid fried & greasy foods,  sweets/candy, white rice (brown or wild rice or Quinoa is OK), white potatoes (sweet potatoes are OK) - anything made from white flour - bagels, doughnuts, rolls, buns, biscuits,white and wheat breads, pizza crust and traditional pasta made of white flour & egg white(vegetarian pasta or spinach or wheat pasta is OK).  Multi-grain bread is OK - like multi-grain flat bread or sandwich thins. Avoid alcohol in excess. Exercise is also important.    Eat all the vegetables  you want - avoid meat, especially red meat and dairy - especially cheese.  Cheese is the most concentrated form of trans-fats which is the worst thing to clog up our arteries. Veggie cheese is OK which can be found in the fresh produce section at [facility] or [employer] or [employer]

## 2014-01-22 NOTE — Progress Notes (Signed)
Subjective:    Patient ID: Omar [name], male    DOB: 12-20-1955, 58 y.o.   MRN: [id number]  HPI Patient presents with 3-4 day hx/o LBP and Lt sciatica with pain severe enough to keep him awake at night. He denies bladder problems or foot drop.   Medication List   aspirin 81 MG tablet  Take 81 mg by mouth daily.     esomeprazole 40 MG capsule  Commonly known as:  NEXIUM  Take 40 mg by mouth 2 (two) times daily.     ezetimibe-simvastatin 10-40 MG per tablet  Commonly known as:  VYTORIN  Take 1 tablet by mouth at bedtime.     finasteride 5 MG tablet  Commonly known as:  PROSCAR  Take 5 mg by mouth daily. Take 1/4 tablet daily.     fish oil-omega-3 fatty acids 1000 MG capsule  Take 1 g by mouth 2 (two) times daily.     HYDROcodone-acetaminophen 5-325 MG per tablet  Commonly known as:  NORCO  Take 1  To 2 tablets 4 x day as needed for severe pain (Max 8 tabs / 24 hours)     levothyroxine 150 MCG tablet  Commonly known as:  SYNTHROID, LEVOTHROID  Take 150 mcg by mouth daily.     loratadine-pseudoephedrine 5-120 MG per tablet  Commonly known as:  CLARITIN-D 12-hour  Take 1 tablet by mouth every morning.     metFORMIN 500 MG 24 hr tablet  Commonly known as:  GLUCOPHAGE XR  1 tablet 2 x day with Bkfst & Lunch and 2 tablets with Supper     multivitamin with minerals Tabs tablet  Take 1 tablet by mouth daily.     predniSONE 20 MG tablet  Commonly known as:  DELTASONE  1 tab 3 x day for 3 days, then 1 tab 2 x day for 3 days, then 1 tab 1 x day for 5 days     tadalafil 20 MG tablet  Commonly known as:  CIALIS  1/2 to 1 tablet every 2 to 3 days as needed for  XXXX     TESTOSTERONE IM  Inject into the muscle every 14 (fourteen) days.     Vitamin D3 5000 UNITS Caps  Take 1 capsule by mouth daily.      No Known Allergies  Past Medical History  Diagnosis Date  . GERD (gastroesophageal reflux disease)   . Sleep apnea 2006    uses a cpap -desated to 55% during study   . Depression   . Complication of anesthesia     Pt states that he experienced some numbness on right side of tongue after surgery for 2 months; continues to experience gum sensitivity to date  . Headache(784.0)     Sinus headaches  . Tinnitus of both ears     Hx: of  . Hypertension   . Hyperlipidemia   . Arthritis   . DJD (degenerative joint disease)   . Thyroid disease   . Hypothyroidism   . Prediabetes   . Vitamin D deficiency   . Other testicular hypofunction    Review of Systems In addition to the HPI above,  No Fever-chills,  No Headache, No changes with Vision or hearing,  No problems swallowing food or Liquids,  No Chest pain or productive Cough or Shortness of Breath,  No Abdominal pain, No Nausea or Vomitting, Bowel movements are regular,  No Blood in stool or Urine,  No dysuria,  Does have a  prickley type dermatitis of the LUE since working in the yard several days ago,   No new weakness,does have intermittent  tingling, numbness in LLE extremity,  No recent weight loss,  No polyuria, polydypsia or polyphagia,  No significant Mental Stressors.  A full 10 point Review of Systems was done, except as stated above, all other Review of Systems were negative  Objective:   Physical Exam..BP 122/76  Pulse 72  Temp(Src) 97.9 F (36.6 C) (Temporal)  Resp 16  Ht 5\' 7"  (1.702 m)  Wt 204 lb 3.2 oz (92.625 kg)  BMI 31.97 kg/m2  HEENT - Eac's patent. TM's Nl. EOM's full. PERRLA. NasoOroPharynx clear. Neck - supple. Nl Thyroid. Carotids 2+ & No bruits, nodes, JVD Chest - Clear equal BS w/o Rales, rhonchi, wheezes. Cor - Nl HS. RRR w/o sig MGR. PP 1(+). No edema. MS- FROM w/o deformities. Muscle power, tone and bulk Nl. Gait Nl. (+) SLR on the Left. Neuro - No obvious Cr N abnormalities. Sensory, motor and Cerebellar functions appear Nl w/o focal abnormalities.  Assessment & Plan:   1. Left low back pain, with sciatica  - Rx Prednisone 20 mg #20 taper  - Rx Norco 5 -  1-2 qid prn severe pain - advised to contact Dr Lynann Bologna for Evaluation & he preferred to call and schedule his own appointment.  2. Sciatica, left

## 2014-04-23 ENCOUNTER — Encounter: Payer: Self-pay | Admitting: Internal Medicine

## 2014-05-07 ENCOUNTER — Ambulatory Visit (INDEPENDENT_AMBULATORY_CARE_PROVIDER_SITE_OTHER): Payer: BC Managed Care – PPO | Admitting: Internal Medicine

## 2014-05-07 ENCOUNTER — Encounter: Payer: Self-pay | Admitting: Internal Medicine

## 2014-05-07 VITALS — BP 126/82 | HR 108 | Temp 98.2°F | Resp 16 | Ht 67.0 in | Wt 196.8 lb

## 2014-05-07 DIAGNOSIS — R7989 Other specified abnormal findings of blood chemistry: Secondary | ICD-10-CM

## 2014-05-07 DIAGNOSIS — E349 Endocrine disorder, unspecified: Secondary | ICD-10-CM

## 2014-05-07 DIAGNOSIS — E1121 Type 2 diabetes mellitus with diabetic nephropathy: Secondary | ICD-10-CM

## 2014-05-07 DIAGNOSIS — Z0001 Encounter for general adult medical examination with abnormal findings: Secondary | ICD-10-CM

## 2014-05-07 DIAGNOSIS — Z125 Encounter for screening for malignant neoplasm of prostate: Secondary | ICD-10-CM

## 2014-05-07 DIAGNOSIS — Z113 Encounter for screening for infections with a predominantly sexual mode of transmission: Secondary | ICD-10-CM

## 2014-05-07 DIAGNOSIS — I10 Essential (primary) hypertension: Secondary | ICD-10-CM

## 2014-05-07 DIAGNOSIS — E785 Hyperlipidemia, unspecified: Secondary | ICD-10-CM

## 2014-05-07 DIAGNOSIS — Z79899 Other long term (current) drug therapy: Secondary | ICD-10-CM

## 2014-05-07 DIAGNOSIS — R945 Abnormal results of liver function studies: Secondary | ICD-10-CM

## 2014-05-07 DIAGNOSIS — E559 Vitamin D deficiency, unspecified: Secondary | ICD-10-CM

## 2014-05-07 DIAGNOSIS — E079 Disorder of thyroid, unspecified: Secondary | ICD-10-CM

## 2014-05-07 DIAGNOSIS — Z1212 Encounter for screening for malignant neoplasm of rectum: Secondary | ICD-10-CM

## 2014-05-07 DIAGNOSIS — R6889 Other general symptoms and signs: Secondary | ICD-10-CM

## 2014-05-07 DIAGNOSIS — E291 Testicular hypofunction: Secondary | ICD-10-CM

## 2014-05-07 NOTE — Progress Notes (Signed)
Patient ID: Omar [name], male   DOB: [birth year], 58 y.o.   MRN: [id number]  Annual Screening Comprehensive Examination  This very nice 58 y.o.MWM presents for complete physical.  Patient has been followed for HTN, Morbid Obesity, T2_NIDDM , Hyperlipidemia, and Vitamin D Deficiency. Patient alsohas OSA & is on CPAP with improved daytime energy and better sleep hygiene.   HTN predates since 2008. Patient's BP has been controlled at home.Today's BP: 126/82 mmHg. Patient denies any cardiac symptoms as chest pain, palpitations, shortness of breath, dizziness or ankle swelling.    Patient's hyperlipidemia is controlled with diet and medications. Patient denies myalgias or other medication SE's. Last lipids were at goal - Total Chol  174; HDL  39*; LDL  NOT CALC; and with elevated Trig 483 on 01/02/2014.   Patient has Morbid Obesity (BMI 31+) and consequent T2_NIDDM  since Aug 2008 and patient denies reactive hypoglycemic symptoms, visual blurring, diabetic polys or paresthesias. Last A1c was  6.7% on 01/02/2014.   Patient has Testosterone Deficiency and is on self administered parenteral therapy with improved sense of well being. Finally, patient has history of Vitamin D Deficiency of 27 in 2008 and last vitamin D was 81 on  01/02/2014.   Medication Sig  . aspirin 81 MG tablet Take 81 mg by mouth daily.  . Cholecalciferol (VITAMIN D3) 5000 UNITS CAPS Take 1 capsule by mouth daily.  Marland Kitchen esomeprazole (NEXIUM) 40 MG capsule Take 40 mg by mouth 2 (two) times daily.  Marland Kitchen ezetimibe-simvastatin (VYTORIN) 10-40 MG per tablet Take 1 tablet by mouth at bedtime.  . finasteride (PROSCAR) 5 MG tablet Take 5 mg by mouth daily. Take 1/4 tablet daily.  . fish oil-omega-3 fatty acids 1000 MG capsule Take 1 g by mouth 2 (two) times daily.   Marland Kitchen HYDROcodone-acetaminophen (NORCO) 5-325 MG per tablet Take 1  To 2 tablets 4 x day as needed for severe pain (Max 8 tabs / 24 hours)  . levothyroxine (SYNTHROID, LEVOTHROID) 150 MCG  tablet Take 150 mcg by mouth daily.  Marland Kitchen loratadine-pseudoephedrine (CLARITIN-D 12-HOUR) 5-120 MG per tablet Take 1 tablet by mouth every morning.   . metFORMIN (GLUCOPHAGE XR) 500 MG 24 hr tablet 1 tablet 2 x day with Bkfst & Lunch and 2 tablets with Supper  . Multiple Vitamin (MULTIVITAMIN WITH MINERALS) TABS Take 1 tablet by mouth daily.  . predniSONE (DELTASONE) 20 MG tablet 1 tab 3 x day for 3 days, then 1 tab 2 x day for 3 days, then 1 tab 1 x day for 5 days  . tadalafil (CIALIS) 20 MG tablet 1/2 to 1 tablet every 2 to 3 days as needed for  XXXX   Wellbutrin 150 mg SR   1 tab bid  . TESTOSTERONE IM Inject into the muscle every 14 (fourteen) days.   No Known Allergies  Past Medical History  Diagnosis Date  . GERD (gastroesophageal reflux disease)   . Sleep apnea 2006    uses a cpap -desated to 55% during study  . Depression   . Complication of anesthesia     Pt states that he experienced some numbness on right side of tongue after surgery for 2 months; continues to experience gum sensitivity to date  . Headache(784.0)     Sinus headaches  . Tinnitus of both ears     Hx: of  . Hypertension   . Hyperlipidemia   . Arthritis   . DJD (degenerative joint disease)   . Thyroid disease   .  Hypothyroidism   . Prediabetes   . Vitamin D deficiency   . Other testicular hypofunction    Health Maintenance  Topic Date Due  . FOOT EXAM  06/23/1967  . OPHTHALMOLOGY EXAM  06/23/1967  . URINE MICROALBUMIN  06/23/1967  . COLONOSCOPY  06/23/2007  . INFLUENZA VACCINE  02/02/2014  . HEMOGLOBIN A1C  07/05/2014  . PNEUMOCOCCAL POLYSACCHARIDE VACCINE (2) 04/11/2016  . TETANUS/TDAP  04/11/2021   Immunization History  Administered Date(s) Administered  . Influenza Split 04/18/2013  . Pneumococcal Polysaccharide-23 04/12/2011  . Tdap 04/12/2011   Past Surgical History  Procedure Laterality Date  . Knee surgery    . Upper gi endoscopy      x2  . Colonoscopy      x2  . Mouth surgery    .  Knee arthroscopy  05/12/2012    Procedure: ARTHROSCOPY KNEE;  Surgeon: Alta Corning, MD;  Location: [place];  Service: Orthopedics;  Laterality: Left;  . Cardiac catheterization  03/30/2010    normal coronaries, RCA takeoff of the left coronary cusp, EF 55% [facility])  . Total knee arthroplasty Left 10/16/2012    Dr Berenice Primas  . Knee arthroplasty Left 10/16/2012    Procedure: LEFT COMPUTER ASSISTED TOTAL KNEE ARTHROPLASTY;  Surgeon: Alta Corning, MD;  Location: [place];  Service: Orthopedics;  Laterality: Left;   Family History  Problem Relation Age of Onset  . Cancer Mother     breast  . Hyperlipidemia Mother   . Hypertension Mother   . Cancer Father     pancreatic   History   Social History  . Marital Status: Married    Spouse Name: N/A    Number of Children: N/A  . Years of Education: N/A   Occupational History  . [employer]   Social History Main Topics  . Smoking status: Current Every Day Smoker -- 1.00 packs/day for 40 years    Types: Cigarettes  . Smokeless tobacco: Never Used  . Alcohol Use: 0.5 oz/week    1 drink(s) per week     Comment: rarely  . Drug Use: No  . Sexual Activity: Yes    ROS  Constitutional: Denies fever, chills, weight loss/gain, headaches, insomnia, fatigue, night sweats or change in appetite. Eyes: Denies redness, blurred vision, diplopia, discharge, itchy or watery eyes.  ENT: Denies discharge, congestion, post nasal drip, epistaxis, sore throat, earache, hearing loss, dental pain, Tinnitus, Vertigo, Sinus pain or snoring.  Cardio: Denies chest pain, palpitations, irregular heartbeat, syncope, dyspnea, diaphoresis, orthopnea, PND, claudication or edema Respiratory: denies cough, dyspnea, DOE, pleurisy, hoarseness, laryngitis or wheezing.  Gastrointestinal: Denies dysphagia, heartburn, reflux, water brash, pain, cramps, nausea, vomiting, bloating, diarrhea, constipation, hematemesis, melena, hematochezia, jaundice or  hemorrhoids Genitourinary: Denies dysuria, frequency, urgency, nocturia, hesitancy, discharge, hematuria or flank pain Musculoskeletal: Denies arthralgia, myalgia, stiffness, Jt. Swelling, pain, limp or strain/sprain. Denies Falls. Skin: Denies puritis, rash, hives, warts, acne, eczema or change in skin lesion Neuro: No weakness, tremor, incoordination, spasms, paresthesia or pain Psychiatric: Denies confusion, memory loss or sensory loss. Denies Depression. Endocrine: Denies change in weight, skin, hair change, nocturia, and paresthesia, diabetic polys, visual blurring or hyper / hypo glycemic episodes.  Heme/Lymph: No excessive bleeding, bruising or enlarged lymph nodes.  Physical Exam  BP 126/82 mmHg  Pulse 108  Temp  98.2 F   Resp 16  Ht 5\' 7"    Wt 196 lb 12.8 oz   BMI 30.82   General Appearance: Over nourished, in no  apparent distress. Eyes: PERRLA, EOMs, conjunctiva no swelling or erythema, normal fundi and vessels. Sinuses: No frontal/maxillary tenderness ENT/Mouth: EACs patent / TMs  nl. Nares clear without erythema, swelling, mucoid exudates. Oral hygiene is good. No erythema, swelling, or exudate. Tongue normal, non-obstructing. Tonsils not swollen or erythematous. Hearing normal.  Neck: Supple, thyroid normal. No bruits, nodes or JVD. Respiratory: Respiratory effort normal.  BS equal and clear bilateral without rales, rhonci, wheezing or stridor. Cardio: Heart sounds are normal with regular rate and rhythm and no murmurs, rubs or gallops. Peripheral pulses are normal and equal bilaterally without edema. No aortic or femoral bruits. Chest: symmetric with normal excursions and percussion.  Abdomen: Flat, soft, with bowl sounds. Nontender, no guarding, rebound, hernias, masses, or organomegaly.  Lymphatics: Non tender without lymphadenopathy.  Genitourinary: No hernias.Testes nl. DRE - prostate nl for age - smooth & firm w/o nodules. Musculoskeletal: Full ROM all peripheral  extremities, joint stability, 5/5 strength, and normal gait. Skin: Warm and dry without rashes, lesions, cyanosis, clubbing or  ecchymosis.  Neuro: Cranial nerves intact, reflexes equal bilaterally. Normal muscle tone, no cerebellar symptoms. Sensation intact.  Pysch: Awake and oriented X 3with normal affect, insight and judgment appropriate.   Assessment and Plan  1. Annual Screening Examination 2. Hypertension  3. Hyperlipidemia 4. T2_NIDDM 5. Vitamin D Deficiency 6. Morbid Obesity 7. OSA / CPAP 8. DJD 9. Hypothyroidism 10. Testosterone Deficiency   Continue prudent diet as discussed, weight control, BP monitoring, regular exercise, and medications as discussed.  Discussed med effects and SE's. Routine screening labs and tests as requested with regular follow-up as recommended.

## 2014-05-07 NOTE — Patient Instructions (Signed)
 Recommend the book "The END of DIETING" by Dr Joel Furman   and the book "The END of DIABETES " by Dr Joel Fuhrman  At [employer] - get book & Audio CD's      Being diabetic has a  300% increased risk for heart attack, stroke, cancer, and alzheimer- type vascular dementia. It is very important that you work harder with diet by avoiding all foods that are white except chicken & fish. Avoid white rice (brown & wild rice is OK), white potatoes (sweetpotatoes in moderation is OK), White bread or wheat bread or anything made out of white flour like bagels, donuts, rolls, buns, biscuits, cakes, pastries, cookies, pizza crust, and pasta (made from white flour & egg whites) - vegetarian pasta or spinach or wheat pasta is OK. Multigrain breads like Arnold's or [employer], or multigrain sandwich thins or flatbreads.  Diet, exercise and weight loss can reverse and cure diabetes in the early stages.  Diet, exercise and weight loss is very important in the control and prevention of complications of diabetes which affects every system in your body, ie. Brain - dementia/stroke, eyes - glaucoma/blindness, heart - heart attack/heart failure, kidneys - dialysis, stomach - gastric paralysis, intestines - malabsorption, nerves - severe painful neuritis, circulation - gangrene & loss of a leg(s), and finally cancer and Alzheimers.    I recommend avoid fried & greasy foods,  sweets/candy, white rice (brown or wild rice or Quinoa is OK), white potatoes (sweet potatoes are OK) - anything made from white flour - bagels, doughnuts, rolls, buns, biscuits,white and wheat breads, pizza crust and traditional pasta made of white flour & egg white(vegetarian pasta or spinach or wheat pasta is OK).  Multi-grain bread is OK - like multi-grain flat bread or sandwich thins. Avoid alcohol in excess. Exercise is also important.    Eat all the vegetables you want - avoid meat, especially red meat and dairy - especially cheese.  Cheese  is the most concentrated form of trans-fats which is the worst thing to clog up our arteries. Veggie cheese is OK which can be found in the fresh produce section at [employer] or [employer] or [employer]  Preventive Care for Adults  A healthy lifestyle and preventive care can promote health and wellness. Preventive health guidelines for men include the following key practices:  A routine yearly physical is a good way to check with your health care provider about your health and preventative screening. It is a chance to share any concerns and updates on your health and to receive a thorough exam.  Visit your dentist for a routine exam and preventative care every 6 months. Brush your teeth twice a day and floss once a day. Good oral hygiene prevents tooth decay and gum disease.  The frequency of eye exams is based on your age, health, family medical history, use of contact lenses, and other factors. Follow your health care provider's recommendations for frequency of eye exams.  Eat a healthy diet. Foods such as vegetables, fruits, whole grains, low-fat dairy products, and lean protein foods contain the nutrients you need without too many calories. Decrease your intake of foods high in solid fats, added sugars, and salt. Eat the right amount of calories for you.Get information about a proper diet from your health care provider, if necessary.  Regular physical exercise is one of the most important things you can do for your health. Most adults should get at least 150 minutes of moderate-intensity exercise (any activity   that increases your heart rate and causes you to sweat) each week. In addition, most adults need muscle-strengthening exercises on 2 or more days a week.  Maintain a healthy weight. The body mass index (BMI) is a screening tool to identify possible weight problems. It provides an estimate of body fat based on height and weight. Your health care provider can find your BMI and can help  you achieve or maintain a healthy weight.For adults 20 years and older:    A BMI of 18.5 to 24.9 is normal.  A BMI of 25 to 29.9 is considered overweight.  A BMI of 30 and above is considered obese.  Maintain normal blood lipids and cholesterol levels by exercising and minimizing your intake of saturated fat. Eat a balanced diet with plenty of fruit and vegetables. Blood tests for lipids and cholesterol should begin at age 50 and be repeated every 5 years. If your lipid or cholesterol levels are high, you are over 50, or you are at high risk for heart disease, you may need your cholesterol levels checked more frequently.Ongoing high lipid and cholesterol levels should be treated with medicines if diet and exercise are not working.  If you smoke, find out from your health care provider how to quit. If you do not use tobacco, do not start.  Lung cancer screening is recommended for adults aged 7-80 years who are at high risk for developing lung cancer because of a history of smoking. A yearly low-dose CT scan of the lungs is recommended for people who have at least a 30-pack-year history of smoking and are a current smoker or have quit within the past 15 years. A pack year of smoking is smoking an average of 1 pack of cigarettes a day for 1 year (for example: 1 pack a day for 30 years or 2 packs a day for 15 years). Yearly screening should continue until the smoker has stopped smoking for at least 15 years. Yearly screening should be stopped for people who develop a health problem that would prevent them from having lung cancer treatment.  If you choose to drink alcohol, do not have more than 2 drinks per day. One drink is considered to be 12 ounces (355 mL) of beer, 5 ounces (148 mL) of wine, or 1.5 ounces (44 mL) of liquor.    High blood pressure causes heart disease and increases the risk of stroke. Your blood pressure should be checked at least every 1-2 years. Ongoing high blood pressure  should be treated with medicines, if weight loss and exercise are not effective.  If you are 41-93 years old, ask your health care provider if you should take aspirin to prevent heart disease.  Diabetes screening involves taking a blood sample to check your fasting blood sugar level.   Colorectal cancer can be detected and often prevented. Most routine colorectal cancer screening begins at the age of 27 and continues through age 27. However, your health care provider may recommend screening at an earlier age if you have risk factors for colon cancer. On a yearly basis, your health care provider may provide home test kits to check for hidden blood in the stool. Use of a small camera at the end of a tube to directly examine the colon (sigmoidoscopy or colonoscopy) can detect the earliest forms of colorectal cancer. Talk to your health care provider about this at age 81, when routine screening begins. Direct exam of the colon should be repeated every 5-10 years through  age 21, unless early forms of precancerous polyps or small growths are found.   Talk with your health care provider about prostate cancer screening.  Testicular cancer screening isrecommended for adult males. Screening includes self-exam, a health care provider exam, and other screening tests. Consult with your health care provider about any symptoms you have or any concerns you have about testicular cancer.  Use sunscreen. Apply sunscreen liberally and repeatedly throughout the day. You should seek shade when your shadow is shorter than you. Protect yourself by wearing long sleeves, pants, a wide-brimmed hat, and sunglasses year round, whenever you are outdoors.  Once a month, do a whole-body skin exam, using a mirror to look at the skin on your back. Tell your health care provider about new moles, moles that have irregular borders, moles that are larger than a pencil eraser, or moles that have changed in shape or color.    Stay  current with required vaccines (immunizations).  Influenza vaccine. All adults should be immunized every year.  Tetanus, diphtheria, and acellular pertussis (Td, Tdap) vaccine. An adult who has not previously received Tdap or who does not know his vaccine status should receive 1 dose of Tdap. This initial dose should be followed by tetanus and diphtheria toxoids (Td) booster doses every 10 years. Adults with an unknown or incomplete history of completing a 3-dose immunization series with Td-containing vaccines should begin or complete a primary immunization series including a Tdap dose. Adults should receive a Td booster every 10 years.    Zoster vaccine. One dose is recommended for adults aged 35 years or older unless certain conditions are present.    PREVNAR  - Pneumococcal 13-valent conjugate (PCV13) vaccine. When indicated, a person who is uncertain of his immunization history and has no record of immunization should receive the PCV13 vaccine. An adult aged 30 years or older who has certain medical conditions and has not been previously immunized should receive 1 dose of PCV13 vaccine. This PCV13 should be followed with a dose of pneumococcal polysaccharide (PPSV23) vaccine. The PPSV23 vaccine dose should be obtained at least 8 weeks after the dose of PCV13 vaccine. An adult aged 40 years or older who has certain medical conditions and previously received 1 or more doses of PPSV23 vaccine should receive 1 dose of PCV13. The PCV13 vaccine dose should be obtained 1 or more years after the last PPSV23 vaccine dose.    PNEUMOVAX - Pneumococcal polysaccharide (PPSV23) vaccine. When PCV13 is also indicated, PCV13 should be obtained first. All adults aged 66 years and older should be immunized. An adult younger than age 43 years who has certain medical conditions should be immunized. Any person who resides in a nursing home or long-term care facility should be immunized. An adult smoker should be  immunized. People with an immunocompromised condition and certain other conditions should receive both PCV13 and PPSV23 vaccines. People with human immunodeficiency virus (HIV) infection should be immunized as soon as possible after diagnosis. Immunization during chemotherapy or radiation therapy should be avoided. Routine use of PPSV23 vaccine is not recommended for American Indians, [place] Natives, or people younger than 65 years unless there are medical conditions that require PPSV23 vaccine. When indicated, people who have unknown immunization and have no record of immunization should receive PPSV23 vaccine. One-time revaccination 5 years after the first dose of PPSV23 is recommended for people aged 19-64 years who have chronic kidney failure, nephrotic syndrome, asplenia, or immunocompromised conditions. People who received 1-2 doses of PPSV23  before age 15 years should receive another dose of PPSV23 vaccine at age 11 years or later if at least 5 years have passed since the previous dose. Doses of PPSV23 are not needed for people immunized with PPSV23 at or after age 20 years.    Hepatitis A vaccine. Adults who wish to be protected from this disease, have certain high-risk conditions, work with hepatitis A-infected animals, work in hepatitis A research labs, or travel to or work in countries with a high rate of hepatitis A should be immunized. Adults who were previously unvaccinated and who anticipate close contact with an international adoptee during the first 60 days after arrival in the [place] from a country with a high rate of hepatitis A should be immunized.    Hepatitis B vaccine. Adults should be immunized if they wish to be protected from this disease, have certain high-risk conditions, may be exposed to blood or other infectious body fluids, are household contacts or sex partners of hepatitis B positive people, are clients or workers in certain care facilities, or travel to or work in  countries with a high rate of hepatitis B.   Preventive Service / Frequency   Ages 14 to 68  Blood pressure check.  Lipid and cholesterol check.  Lung cancer screening. / Every year if you are aged 101-80 years and have a 30-pack-year history of smoking and currently smoke or have quit within the past 15 years. Yearly screening is stopped once you have quit smoking for at least 15 years or develop a health problem that would prevent you from having lung cancer treatment.  Fecal occult blood test (FOBT) of stool. / Every year beginning at age 57 and continuing until age 44. You may not have to do this test if you get a colonoscopy every 10 years.  Flexible sigmoidoscopy** or colonoscopy.** / Every 5 years for a flexible sigmoidoscopy or every 10 years for a colonoscopy beginning at age 97 and continuing until age 26.

## 2014-05-08 LAB — BASIC METABOLIC PANEL WITH GFR
BUN: 10 mg/dL (ref 6–23)
CO2: 26 mEq/L (ref 19–32)
Calcium: 9.6 mg/dL (ref 8.4–10.5)
Chloride: 101 mEq/L (ref 96–112)
Creat: 1.07 mg/dL (ref 0.50–1.35)
GFR, Est African American: 89 mL/min
GFR, Est Non African American: 77 mL/min
Glucose, Bld: 137 mg/dL — ABNORMAL HIGH (ref 70–99)
Potassium: 4.1 mEq/L (ref 3.5–5.3)
Sodium: 139 mEq/L (ref 135–145)

## 2014-05-08 LAB — VITAMIN D 25 HYDROXY (VIT D DEFICIENCY, FRACTURES): Vit D, 25-Hydroxy: 92 ng/mL — ABNORMAL HIGH (ref 30–89)

## 2014-05-08 LAB — HEPATIC FUNCTION PANEL
ALT: 21 U/L (ref 0–53)
AST: 19 U/L (ref 0–37)
Albumin: 4.1 g/dL (ref 3.5–5.2)
Alkaline Phosphatase: 53 U/L (ref 39–117)
Bilirubin, Direct: 0.1 mg/dL (ref 0.0–0.3)
Indirect Bilirubin: 0.4 mg/dL (ref 0.2–1.2)
Total Bilirubin: 0.5 mg/dL (ref 0.2–1.2)
Total Protein: 6.9 g/dL (ref 6.0–8.3)

## 2014-05-08 LAB — HEPATITIS C ANTIBODY: HCV Ab: NEGATIVE

## 2014-05-08 LAB — CBC WITH DIFFERENTIAL/PLATELET
Basophils Absolute: 0.2 10*3/uL — ABNORMAL HIGH (ref 0.0–0.1)
Basophils Relative: 3 % — ABNORMAL HIGH (ref 0–1)
Eosinophils Absolute: 0.3 10*3/uL (ref 0.0–0.7)
Eosinophils Relative: 5 % (ref 0–5)
HCT: 48.3 % (ref 39.0–52.0)
Hemoglobin: 16.8 g/dL (ref 13.0–17.0)
Lymphocytes Relative: 46 % (ref 12–46)
Lymphs Abs: 2.9 10*3/uL (ref 0.7–4.0)
MCH: 30 pg (ref 26.0–34.0)
MCHC: 34.8 g/dL (ref 30.0–36.0)
MCV: 86.3 fL (ref 78.0–100.0)
Monocytes Absolute: 0.5 10*3/uL (ref 0.1–1.0)
Monocytes Relative: 8 % (ref 3–12)
Neutro Abs: 2.4 10*3/uL (ref 1.7–7.7)
Neutrophils Relative %: 38 % — ABNORMAL LOW (ref 43–77)
Platelets: 243 10*3/uL (ref 150–400)
RBC: 5.6 MIL/uL (ref 4.22–5.81)
RDW: 14.4 % (ref 11.5–15.5)
WBC: 6.2 10*3/uL (ref 4.0–10.5)

## 2014-05-08 LAB — URINALYSIS, MICROSCOPIC ONLY
Bacteria, UA: NONE SEEN
Casts: NONE SEEN
Crystals: NONE SEEN
Squamous Epithelial / LPF: NONE SEEN

## 2014-05-08 LAB — MICROALBUMIN / CREATININE URINE RATIO
Creatinine, Urine: 108.2 mg/dL
Microalb Creat Ratio: 4.6 mg/g (ref 0.0–30.0)
Microalb, Ur: 0.5 mg/dL (ref <2.0)

## 2014-05-08 LAB — HEPATITIS A ANTIBODY, TOTAL: Hep A Total Ab: NONREACTIVE

## 2014-05-08 LAB — HEPATITIS B CORE ANTIBODY, TOTAL: Hep B Core Total Ab: NONREACTIVE

## 2014-05-08 LAB — URIC ACID: Uric Acid, Serum: 4.7 mg/dL (ref 4.0–7.8)

## 2014-05-08 LAB — RPR

## 2014-05-08 LAB — TSH: TSH: 0.457 u[IU]/mL (ref 0.350–4.500)

## 2014-05-08 LAB — INSULIN, FASTING: Insulin fasting, serum: 20.3 u[IU]/mL — ABNORMAL HIGH (ref 2.0–19.6)

## 2014-05-08 LAB — TESTOSTERONE: Testosterone: 208 ng/dL — ABNORMAL LOW (ref 300–890)

## 2014-05-08 LAB — HEPATITIS B SURFACE ANTIBODY,QUALITATIVE: Hep B S Ab: NEGATIVE

## 2014-05-08 LAB — HIV ANTIBODY (ROUTINE TESTING W REFLEX): HIV 1&2 Ab, 4th Generation: NONREACTIVE

## 2014-05-08 LAB — MAGNESIUM: Magnesium: 1.9 mg/dL (ref 1.5–2.5)

## 2014-05-08 LAB — VITAMIN B12: Vitamin B-12: 719 pg/mL (ref 211–911)

## 2014-05-09 LAB — HEPATITIS B E ANTIBODY: Hepatitis Be Antibody: NONREACTIVE

## 2014-05-15 ENCOUNTER — Other Ambulatory Visit: Payer: Self-pay | Admitting: Internal Medicine

## 2014-05-15 ENCOUNTER — Encounter: Payer: Self-pay | Admitting: Internal Medicine

## 2014-05-15 MED ORDER — TESTOSTERONE CYPIONATE 200 MG/ML IM SOLN: 400.0000 mg | INTRAMUSCULAR | Status: DC

## 2014-05-16 ENCOUNTER — Encounter: Payer: Self-pay | Admitting: Internal Medicine

## 2014-05-27 ENCOUNTER — Other Ambulatory Visit: Payer: Self-pay | Admitting: *Deleted

## 2014-05-27 MED ORDER — FINASTERIDE 5 MG PO TABS: 5.0000 mg | ORAL_TABLET | Freq: Every day | ORAL | Status: DC

## 2014-05-28 ENCOUNTER — Other Ambulatory Visit: Payer: Self-pay

## 2014-05-28 MED ORDER — FINASTERIDE 5 MG PO TABS: 5.0000 mg | ORAL_TABLET | Freq: Every day | ORAL | Status: DC

## 2014-08-22 ENCOUNTER — Encounter: Payer: Self-pay | Admitting: Internal Medicine

## 2014-08-22 ENCOUNTER — Ambulatory Visit (INDEPENDENT_AMBULATORY_CARE_PROVIDER_SITE_OTHER): Payer: BLUE CROSS/BLUE SHIELD | Admitting: Internal Medicine

## 2014-08-22 VITALS — BP 100/68 | HR 92 | Temp 98.6°F | Resp 16 | Ht 67.0 in | Wt 200.8 lb

## 2014-08-22 DIAGNOSIS — E785 Hyperlipidemia, unspecified: Secondary | ICD-10-CM

## 2014-08-22 DIAGNOSIS — E349 Endocrine disorder, unspecified: Secondary | ICD-10-CM

## 2014-08-22 DIAGNOSIS — E079 Disorder of thyroid, unspecified: Secondary | ICD-10-CM

## 2014-08-22 DIAGNOSIS — M6580 Other synovitis and tenosynovitis, unspecified site: Secondary | ICD-10-CM

## 2014-08-22 DIAGNOSIS — E1121 Type 2 diabetes mellitus with diabetic nephropathy: Secondary | ICD-10-CM

## 2014-08-22 DIAGNOSIS — I10 Essential (primary) hypertension: Secondary | ICD-10-CM

## 2014-08-22 DIAGNOSIS — Z79899 Other long term (current) drug therapy: Secondary | ICD-10-CM

## 2014-08-22 DIAGNOSIS — R110 Nausea: Secondary | ICD-10-CM

## 2014-08-22 DIAGNOSIS — K219 Gastro-esophageal reflux disease without esophagitis: Secondary | ICD-10-CM

## 2014-08-22 DIAGNOSIS — E291 Testicular hypofunction: Secondary | ICD-10-CM

## 2014-08-22 DIAGNOSIS — E559 Vitamin D deficiency, unspecified: Secondary | ICD-10-CM

## 2014-08-22 DIAGNOSIS — M76899 Other specified enthesopathies of unspecified lower limb, excluding foot: Secondary | ICD-10-CM

## 2014-08-22 MED ORDER — PREDNISONE 20 MG PO TABS: ORAL_TABLET | ORAL | Status: DC

## 2014-08-22 MED ORDER — RANITIDINE HCL 300 MG PO TABS: ORAL_TABLET | ORAL | Status: DC

## 2014-08-22 MED ORDER — PROCHLORPERAZINE MALEATE 10 MG PO TABS: ORAL_TABLET | ORAL | Status: DC

## 2014-08-22 NOTE — Progress Notes (Signed)
Patient ID: Omar [name], male   DOB: 1956-05-02, 59 y.o.   MRN: [id number]   This very nice 59 y.o.male presents for 3 month follow up with Hypertension, Hyperlipidemia, Pre-Diabetes and Vitamin D Deficiency.    Patient is treated for HTN & BP has been controlled at home. Today's  . Patient has had no complaints of any cardiac type chest pain, palpitations, dyspnea/orthopnea/PND, dizziness, claudication, or dependent edema.   Hyperlipidemia is controlled with diet & meds. Patient denies myalgias or other med SE's. Last Lipids were 01/02/2014: Cholesterol, Total 174; HDL Cholesterol by NMR 39*; LDL (calc) NOT CALC; Triglycerides 483*   Also, the patient has history of T2_NIDDM PreDiabetes and has had no symptoms of reactive hypoglycemia, diabetic polys, paresthesias or visual blurring.  Last A1c was 01/02/2014: Hemoglobin-A1c 6.7*    Further, the patient also has history of Vitamin D Deficiency and supplements vitamin D without any suspected side-effects. Last vitamin D was 05/07/2014: Vit D, 25-Hydroxy 92*   Outpatient Prescriptions Prior to Visit  Medication Sig Dispense Refill  . aspirin 81 MG tablet Take 81 mg by mouth daily.    . Cholecalciferol (VITAMIN D3) 5000 UNITS CAPS Take 1 capsule by mouth daily.    Marland Kitchen esomeprazole (NEXIUM) 40 MG capsule Take 40 mg by mouth 2 (two) times daily.    Marland Kitchen ezetimibe-simvastatin (VYTORIN) 10-40 MG per tablet Take 1 tablet by mouth at bedtime.    . finasteride (PROSCAR) 5 MG tablet Take 1 tablet (5 mg total) by mouth daily. 90 tablet 3  . fish oil-omega-3 fatty acids 1000 MG capsule Take 1 g by mouth 2 (two) times daily.     Marland Kitchen levothyroxine (SYNTHROID, LEVOTHROID) 150 MCG tablet Take 150 mcg by mouth daily.    Marland Kitchen loratadine-pseudoephedrine (CLARITIN-D 12-HOUR) 5-120 MG per tablet Take 1 tablet by mouth every morning.     . metFORMIN (GLUCOPHAGE XR) 500 MG 24 hr tablet 1 tablet 2 x day with Bkfst & Lunch and 2 tablets with Supper 120 tablet 99  . Multiple Vitamin  (MULTIVITAMIN WITH MINERALS) TABS Take 1 tablet by mouth daily.    . tadalafil (CIALIS) 20 MG tablet 1/2 to 1 tablet every 2 to 3 days as needed for  XXXX 50 tablet 99  . testosterone cypionate (DEPO-TESTOSTERONE) 200 MG/ML injection Inject 2 mLs (400 mg total) into the muscle every 14 (fourteen) days. 10 mL 5   No facility-administered medications prior to visit.    No Known Allergies  PMHx:   Past Medical History  Diagnosis Date  . GERD (gastroesophageal reflux disease)   . Sleep apnea 2006    uses a cpap -desated to 55% during study  . Depression   . Complication of anesthesia     Pt states that he experienced some numbness on right side of tongue after surgery for 2 months; continues to experience gum sensitivity to date  . Headache(784.0)     Sinus headaches  . Tinnitus of both ears     Hx: of  . Hypertension   . Hyperlipidemia   . Arthritis   . DJD (degenerative joint disease)   . Thyroid disease   . Hypothyroidism   . Prediabetes   . Vitamin D deficiency   . Other testicular hypofunction    Immunization History  Administered Date(s) Administered  . Influenza Split 04/18/2013  . Pneumococcal Polysaccharide-23 04/12/2011  . Tdap 04/12/2011   Past Surgical History  Procedure Laterality Date  . Knee surgery    .  Upper gi endoscopy      x2  . Colonoscopy      x2  . Mouth surgery    . Knee arthroscopy  05/12/2012    Procedure: ARTHROSCOPY KNEE;  Surgeon: Alta Corning, MD;  Location: [facility];  Service: Orthopedics;  Laterality: Left;  . Cardiac catheterization  03/30/2010    normal coronaries, RCA takeoff of the left coronary cusp, EF 55% [facility])  . Total knee arthroplasty Left 10/16/2012    Dr Berenice Primas  . Knee arthroplasty Left 10/16/2012    Procedure: LEFT COMPUTER ASSISTED TOTAL KNEE ARTHROPLASTY;  Surgeon: Alta Corning, MD;  Location: [facility];  Service: Orthopedics;  Laterality: Left;   FHx:    Reviewed / unchanged  SHx:    Reviewed /  unchanged  Systems Review:  Constitutional: Denies fever, chills, wt changes, headaches, insomnia, fatigue, night sweats, change in appetite. Eyes: Denies redness, blurred vision, diplopia, discharge, itchy, watery eyes.  ENT: Denies discharge, congestion, post nasal drip, epistaxis, sore throat, earache, hearing loss, dental pain, tinnitus, vertigo, sinus pain, snoring.  CV: Denies chest pain, palpitations, irregular heartbeat, syncope, dyspnea, diaphoresis, orthopnea, PND, claudication or edema. Respiratory: denies cough, dyspnea, DOE, pleurisy, hoarseness, laryngitis, wheezing.  Gastrointestinal: Denies dysphagia, odynophagia, heartburn, reflux, water brash, abdominal pain or cramps, nausea, vomiting, bloating, diarrhea, constipation, hematemesis, melena, hematochezia  or hemorrhoids. Genitourinary: Denies dysuria, frequency, urgency, nocturia, hesitancy, discharge, hematuria or flank pain. Musculoskeletal: Denies arthralgias, myalgias, stiffness, jt. swelling, pain, limping or strain/sprain.  Skin: Denies pruritus, rash, hives, warts, acne, eczema or change in skin lesion(s). Neuro: No weakness, tremor, incoordination, spasms, paresthesia or pain. Psychiatric: Denies confusion, memory loss or sensory loss. Endo: Denies change in weight, skin or hair change.  Heme/Lymph: No excessive bleeding, bruising or enlarged lymph nodes.  Physical Exam  There were no vitals taken for this visit.  Appears well nourished and in no distress. Eyes: PERRLA, EOMs, conjunctiva no swelling or erythema. Sinuses: No frontal/maxillary tenderness ENT/Mouth: EAC's clear, TM's nl w/o erythema, bulging. Nares clear w/o erythema, swelling, exudates. Oropharynx clear without erythema or exudates. Oral hygiene is good. Tongue normal, non obstructing. Hearing intact.  Neck: Supple. Thyroid nl. Car 2+/2+ without bruits, nodes or JVD. Chest: Respirations nl with BS clear & equal w/o rales, rhonchi, wheezing or stridor.   Cor: Heart sounds normal w/ regular rate and rhythm without sig. murmurs, gallops, clicks, or rubs. Peripheral pulses normal and equal  without edema.  Abdomen: Soft & bowel sounds normal. Non-tender w/o guarding, rebound, hernias, masses, or organomegaly.  Lymphatics: Unremarkable.  Musculoskeletal: Full ROM all peripheral extremities, joint stability, 5/5 strength, and normal gait.  Skin: Warm, dry without exposed rashes, lesions or ecchymosis apparent.  Neuro: Cranial nerves intact, reflexes equal bilaterally. Sensory-motor testing grossly intact. Tendon reflexes grossly intact.  Pysch: Alert & oriented x 3.  Insight and judgement nl & appropriate. No ideations.  Assessment and Plan:  1. Hypertension - Continue monitor blood pressure at home. Continue diet/meds same.  2. Hyperlipidemia - Continue diet/meds, exercise,& lifestyle modifications. Continue monitor periodic cholesterol/liver & renal functions   3. T2_NIDDM Pre-Diabetes - Continue diet, exercise, lifestyle modifications. Monitor appropriate labs.  4. Vitamin D Deficiency - Continue supplementation.   Recommended regular exercise, BP monitoring, weight control, and discussed med and SE's. Recommended labs to assess and monitor clinical status. Further disposition pending results of labs.

## 2014-08-22 NOTE — Patient Instructions (Signed)

## 2014-08-23 LAB — BASIC METABOLIC PANEL WITH GFR
BUN: 15 mg/dL (ref 6–23)
CO2: 26 mEq/L (ref 19–32)
Calcium: 8.4 mg/dL (ref 8.4–10.5)
Chloride: 104 mEq/L (ref 96–112)
Creat: 1.13 mg/dL (ref 0.50–1.35)
GFR, Est African American: 83 mL/min
GFR, Est Non African American: 72 mL/min
Glucose, Bld: 134 mg/dL — ABNORMAL HIGH (ref 70–99)
Potassium: 4 mEq/L (ref 3.5–5.3)
Sodium: 139 mEq/L (ref 135–145)

## 2014-08-23 LAB — CBC WITH DIFFERENTIAL/PLATELET
Basophils Absolute: 0.1 10*3/uL (ref 0.0–0.1)
Basophils Relative: 1 % (ref 0–1)
Eosinophils Absolute: 0.2 10*3/uL (ref 0.0–0.7)
Eosinophils Relative: 3 % (ref 0–5)
HCT: 50.5 % (ref 39.0–52.0)
Hemoglobin: 16.9 g/dL (ref 13.0–17.0)
Lymphocytes Relative: 19 % (ref 12–46)
Lymphs Abs: 1.3 10*3/uL (ref 0.7–4.0)
MCH: 28.9 pg (ref 26.0–34.0)
MCHC: 33.5 g/dL (ref 30.0–36.0)
MCV: 86.3 fL (ref 78.0–100.0)
MPV: 8.9 fL (ref 8.6–12.4)
Monocytes Absolute: 0.7 10*3/uL (ref 0.1–1.0)
Monocytes Relative: 10 % (ref 3–12)
Neutro Abs: 4.4 10*3/uL (ref 1.7–7.7)
Neutrophils Relative %: 67 % (ref 43–77)
Platelets: 199 10*3/uL (ref 150–400)
RBC: 5.85 MIL/uL — ABNORMAL HIGH (ref 4.22–5.81)
RDW: 14.6 % (ref 11.5–15.5)
WBC: 6.6 10*3/uL (ref 4.0–10.5)

## 2014-08-23 LAB — INSULIN, FASTING: Insulin fasting, serum: 21.4 u[IU]/mL — ABNORMAL HIGH (ref 2.0–19.6)

## 2014-08-23 LAB — HEPATIC FUNCTION PANEL
ALT: 23 U/L (ref 0–53)
AST: 19 U/L (ref 0–37)
Albumin: 3.7 g/dL (ref 3.5–5.2)
Alkaline Phosphatase: 44 U/L (ref 39–117)
Bilirubin, Direct: 0.1 mg/dL (ref 0.0–0.3)
Indirect Bilirubin: 0.2 mg/dL (ref 0.2–1.2)
Total Bilirubin: 0.3 mg/dL (ref 0.2–1.2)
Total Protein: 6 g/dL (ref 6.0–8.3)

## 2014-08-23 LAB — TSH: TSH: 0.346 u[IU]/mL — ABNORMAL LOW (ref 0.350–4.500)

## 2014-08-23 LAB — LIPID PANEL
Cholesterol: 148 mg/dL (ref 0–200)
HDL: 48 mg/dL (ref >39)
LDL Cholesterol: 67 mg/dL (ref 0–99)
Total CHOL/HDL Ratio: 3.1 Ratio
Triglycerides: 167 mg/dL — ABNORMAL HIGH (ref <150)
VLDL: 33 mg/dL (ref 0–40)

## 2014-08-23 LAB — VITAMIN D 25 HYDROXY (VIT D DEFICIENCY, FRACTURES): Vit D, 25-Hydroxy: 54 ng/mL (ref 30–100)

## 2014-08-23 LAB — HEMOGLOBIN A1C
Hgb A1c MFr Bld: 7.3 % — ABNORMAL HIGH (ref <5.7)
Mean Plasma Glucose: 163 mg/dL — ABNORMAL HIGH (ref <117)

## 2014-08-23 LAB — MAGNESIUM: Magnesium: 1.9 mg/dL (ref 1.5–2.5)

## 2014-08-24 ENCOUNTER — Encounter: Payer: Self-pay | Admitting: Internal Medicine

## 2014-08-24 NOTE — Progress Notes (Addendum)
Patient ID: Omar [name], male   DOB: 1956-05-11, 59 y.o.   MRN: [id number]   This very nice 59 y.o. MWM presents for 3 month follow up with Hypertension, Hyperlipidemia, Pre-Diabetes and Vitamin D Deficiency.    Patient is treated for HTN & BP has been controlled at home. Today's BP: 100/68 mmHg. Patient has had no complaints of any cardiac type chest pain, palpitations, dyspnea/orthopnea/PND, dizziness, claudication, or dependent edema.  Hyperlipidemia is controlled with diet & meds. Patient denies myalgias or other med SE's. Last Lipids were at goal - Total Chol 148; HDL 48; LDL  67; Trig 167 on 08/22/2014.   Also, the patient has history of T2_NIDDM w/CKD2 and has had no symptoms of reactive hypoglycemia, diabetic polys, paresthesias or visual blurring.  Last A1c was 7.3% on 08/22/2014.   Further, the patient also has history of Vitamin D Deficiency and supplements vitamin D without any suspected side-effects. Last vitamin D was 54 on 08/22/2014.  Medication Sig  . aspirin 81 MG tablet Take 81 mg by mouth daily.  Marland Kitchen VITAMIN D 5000 U Take 1 capsule by mouth daily.  Marland Kitchen esomeprazole  40 MG capsule Take 40 mg by mouth 2 (two) times daily.  Marland Kitchen ezetimibe-simvastatin (VYTORIN) 10-40 MG per tablet Take 1 tablet by mouth at bedtime.  . finasteride  5 MG tablet Take 1 tablet (5 mg total) by mouth daily.  . fish oil 1000 MG capsule Take 1 g by mouth 2 (two) times daily.   Marland Kitchen levothyroxine  150 MCG tablet Take 150 mcg by mouth daily.  Marland Kitchen CLARITIN-D 12-HR 5-120 MG  Take 1 tablet by mouth every morning.   . metFORMIN - XR) 500 mg 1 tablet 2 x day with Bkfst & Lunch and 2 tablets with Supper  . MULTIVITAMIN WITH MINERALS Take 1 tablet by mouth daily.  . tadalafil (CIALIS) 20 MG tablet 1/2 to 1 tablet every 2 to 3 days as needed for  XXXX  . DEPO-TESTOSTERONE 200 MG inj Inject 2 mLs (400 mg total) into the muscle every 14 (fourteen) days.   No Known Allergies  PMHx:   Past Medical History  Diagnosis Date   . GERD (gastroesophageal reflux disease)   . Sleep apnea 2006    uses a cpap -desated to 55% during study  . Depression   . Complication of anesthesia     Pt states that he experienced some numbness on right side of tongue after surgery for 2 months; continues to experience gum sensitivity to date  . Headache(784.0)     Sinus headaches  . Tinnitus of both ears     Hx: of  . Hypertension   . Hyperlipidemia   . Arthritis   . DJD (degenerative joint disease)   . Thyroid disease   . Hypothyroidism   . Prediabetes   . Vitamin D deficiency   . Other testicular hypofunction    Immunization History  Administered Date(s) Administered  . Influenza Split 04/18/2013  . Pneumococcal Polysaccharide-23 04/12/2011  . Tdap 04/12/2011   Past Surgical History  Procedure Laterality Date  . Knee surgery    . Upper gi endoscopy      x2  . Colonoscopy      x2  . Mouth surgery    . Knee arthroscopy  05/12/2012    Procedure: ARTHROSCOPY KNEE;  Surgeon: Alta Corning, MD;  Location: [place];  Service: Orthopedics;  Laterality: Left;  . Cardiac catheterization  03/30/2010  normal coronaries, RCA takeoff of the left coronary cusp, EF 55% [facility])  . Total knee arthroplasty Left 10/16/2012    Dr Berenice Primas  . Knee arthroplasty Left 10/16/2012    Procedure: LEFT COMPUTER ASSISTED TOTAL KNEE ARTHROPLASTY;  Surgeon: Alta Corning, MD;  Location: [place];  Service: Orthopedics;  Laterality: Left;   FHx:    Reviewed / unchanged  SHx:    Reviewed / unchanged  Systems Review:  Constitutional: Denies fever, chills, wt changes, headaches, insomnia, fatigue, night sweats, change in appetite. Eyes: Denies redness, blurred vision, diplopia, discharge, itchy, watery eyes.  ENT: Denies discharge, congestion, post nasal drip, epistaxis, sore throat, earache, hearing loss, dental pain, tinnitus, vertigo, sinus pain, snoring.  CV: Denies chest pain, palpitations, irregular heartbeat,  syncope, dyspnea, diaphoresis, orthopnea, PND, claudication or edema. Respiratory: denies cough, dyspnea, DOE, pleurisy, hoarseness, laryngitis, wheezing.  Gastrointestinal: Denies dysphagia, odynophagia, heartburn, reflux, water brash, abdominal pain or cramps, nausea, vomiting, bloating, diarrhea, constipation, hematemesis, melena, hematochezia  or hemorrhoids. Genitourinary: Denies dysuria, frequency, urgency, nocturia, hesitancy, discharge, hematuria or flank pain. Musculoskeletal: Denies arthralgias, myalgias, stiffness, jt. swelling, pain, limping or strain/sprain.  Skin: Denies pruritus, rash, hives, warts, acne, eczema or change in skin lesion(s). Neuro: No weakness, tremor, incoordination, spasms, paresthesia or pain. Psychiatric: Denies confusion, memory loss or sensory loss. Endo: Denies change in weight, skin or hair change.  Heme/Lymph: No excessive bleeding, bruising or enlarged lymph nodes.  Physical Exam  BP 100/68   P 92  T 98.6 F   R 16  Ht 5\' 7"    Wt 200 lb 12.8 oz     BMI 31.44  Appears over nourished and in no distress. Eyes: PERRLA, EOMs, conjunctiva no swelling or erythema. Sinuses: No frontal/maxillary tenderness ENT/Mouth: EAC's clear, TM's nl w/o erythema, bulging. Nares clear w/o erythema, swelling, exudates. Oropharynx clear without erythema or exudates. Oral hygiene is good. Tongue normal, non obstructing. Hearing intact.  Neck: Supple. Thyroid nl. Car 2+/2+ without bruits, nodes or JVD. Chest: Respirations nl with BS clear & equal w/o rales, rhonchi, wheezing or stridor.  Cor: Heart sounds normal w/ regular rate and rhythm without sig. murmurs, gallops, clicks, or rubs. Peripheral pulses normal and equal  without edema.  Abdomen: Soft & bowel sounds normal. Non-tender w/o guarding, rebound, hernias, masses, or organomegaly.  Lymphatics: Unremarkable.  Musculoskeletal: Full ROM all peripheral extremities, joint stability, 5/5 strength, and normal gait.   Skin: Warm, dry without exposed rashes, lesions or ecchymosis apparent.  Neuro: Cranial nerves intact, reflexes equal bilaterally. Sensory-motor testing grossly intact. Tendon reflexes grossly intact.  Pysch: Alert & oriented x 3.  Insight and judgement nl & appropriate. No ideations.  Assessment and Plan:  1. Essential hypertension   2. Hyperlipidemia  - Lipid panel  3. Morbid obesity  4. Type 2 diabetes mellitus with diabetic nephropathy  - Hemoglobin A1c - Insulin, fasting  5. Hypothyroidism  - TSH  6. Vitamin D deficiency  - Vit D  25 hydroxy (rtn osteoporosis monitoring)  7. Testosterone deficiency   8. Medication management  - CBC with Differential/Platelet - BASIC METABOLIC PANEL WITH GFR - Hepatic function panel - Magnesium  9. Adductor tendonitis  - predniSONE (DELTASONE) 20 MG tablet; 1 tab 3 x day for 3 days, then 1 tab 2 x day for 3 days, then 1 tab 1 x day for 5 days  Dispense: 20 tablet; Refill: 0  10. Gastroesophageal reflux disease, esophagitis presence not specified  - ranitidine (ZANTAC) 300 MG tablet;  Take 1 tablet 1 to 2 x day for acid indigestion & reflux   11. Nausea without vomiting   Recommended regular exercise, BP monitoring, weight control, and discussed med and SE's. Recommended labs to assess and monitor clinical status. Further disposition pending results of labs.

## 2014-09-09 ENCOUNTER — Ambulatory Visit (INDEPENDENT_AMBULATORY_CARE_PROVIDER_SITE_OTHER): Payer: BLUE CROSS/BLUE SHIELD | Admitting: Internal Medicine

## 2014-09-09 ENCOUNTER — Encounter: Payer: Self-pay | Admitting: Internal Medicine

## 2014-09-09 VITALS — BP 136/80 | HR 98 | Temp 98.2°F | Resp 18 | Ht 67.0 in | Wt 197.0 lb

## 2014-09-09 DIAGNOSIS — J069 Acute upper respiratory infection, unspecified: Secondary | ICD-10-CM

## 2014-09-09 MED ORDER — HYDROCODONE-ACETAMINOPHEN 5-325 MG PO TABS: 2.0000 | ORAL_TABLET | ORAL | Status: DC | PRN

## 2014-09-09 MED ORDER — PREDNISONE 20 MG PO TABS: ORAL_TABLET | ORAL | Status: DC

## 2014-09-09 MED ORDER — BENZONATATE 100 MG PO CAPS: 100.0000 mg | ORAL_CAPSULE | Freq: Four times a day (QID) | ORAL | Status: DC | PRN

## 2014-09-09 MED ORDER — ALBUTEROL SULFATE HFA 108 (90 BASE) MCG/ACT IN AERS: 2.0000 | INHALATION_SPRAY | RESPIRATORY_TRACT | Status: DC | PRN

## 2014-09-09 NOTE — Progress Notes (Signed)
Subjective:    Patient ID: Omar [name], male    DOB: 1955-12-28, 59 y.o.   MRN: [id number]  HPI  The patient wishes to address some mild URI symptoms of mild nasal stuffiness, cough with some shortness of breath for the past few days without fever. He states that this has been going on 2-3 days.  He states that this started after one day of diarrhea.  Diarrhea has since resolved.  Nobody else has similar symptoms.  He has not tried any relieving factors at this time.  Patient states that working in the yard made his cough a little worse.    Review of Systems  Constitutional: Positive for fatigue. Negative for fever and chills.  HENT: Negative for congestion, postnasal drip, rhinorrhea, sinus pressure and sore throat.   Respiratory: Positive for cough, chest tightness and shortness of breath. Negative for wheezing.   Cardiovascular: Negative for chest pain, palpitations and leg swelling.  Gastrointestinal: Positive for diarrhea. Negative for nausea, vomiting, abdominal pain and constipation.  Neurological: Negative for dizziness, light-headedness and headaches.       Objective:   Physical Exam  Constitutional: He is oriented to person, place, and time. He appears well-developed and well-nourished. No distress.  HENT:  Head: Normocephalic and atraumatic.  Mouth/Throat: Oropharynx is clear and moist. No oropharyngeal exudate.  Eyes: Conjunctivae and EOM are normal. Pupils are equal, round, and reactive to light. No scleral icterus.  Neck: Normal range of motion. Neck supple. No JVD present. No thyromegaly present.  Cardiovascular: Normal rate, regular rhythm, normal heart sounds and intact distal pulses.  Exam reveals no gallop and no friction rub.   No murmur heard. Pulmonary/Chest: Effort normal and breath sounds normal. No respiratory distress. He has no wheezes. He has no rales. He exhibits no tenderness.  Abdominal: Soft. Bowel sounds are normal. He exhibits no distension and no  mass. There is no tenderness. There is no rebound and no guarding.  Musculoskeletal: Normal range of motion.  Lymphadenopathy:    He has no cervical adenopathy.  Neurological: He is alert and oriented to person, place, and time.  Skin: Skin is warm and dry. He is not diaphoretic.  Psychiatric: He has a normal mood and affect. His behavior is normal. Judgment and thought content normal.  Nursing note and vitals reviewed.         Assessment & Plan:    1. Acute upper respiratory infection  - albuterol (PROVENTIL HFA;VENTOLIN HFA) 108 (90 BASE) MCG/ACT inhaler; Inhale 2 puffs into the lungs every 2 (two) hours as needed for wheezing or shortness of breath (cough).  Dispense: 1 Inhaler; Refill: 0, for coughing an shortness of breath - HYDROcodone-acetaminophen (NORCO) 5-325 MG per tablet; Take 2 tablets by mouth every 4 (four) hours as needed.  Dispense: 20 tablet; Refill: 0 for coughing.  No driving on this medication reminder given - predniSONE (DELTASONE) 20 MG tablet; 3 tabs po day one, then 2 tabs daily x 4 days  Dispense: 11 tablet; Refill: 0, patient instructed to wait a few days and if no better to take in 2-3 days - benzonatate (TESSALON PERLES) 100 MG capsule; Take 1 capsule (100 mg total) by mouth every 6 (six) hours as needed for cough.  Dispense: 30 capsule; Refill: 1 for cough  Patient to call if symptoms no better and will give  Zpack.  Suspect that this is likely viral in etiology and will try symptomatic therapy at this time.  Patient in agreement of  plan.

## 2014-09-09 NOTE — Patient Instructions (Signed)

## 2014-09-11 ENCOUNTER — Other Ambulatory Visit: Payer: Self-pay | Admitting: Internal Medicine

## 2014-09-11 DIAGNOSIS — B349 Viral infection, unspecified: Secondary | ICD-10-CM

## 2014-09-11 DIAGNOSIS — J0410 Acute tracheitis without obstruction: Secondary | ICD-10-CM

## 2014-09-11 MED ORDER — AZITHROMYCIN 250 MG PO TABS
ORAL_TABLET | ORAL | Status: DC
Start: 2014-09-11 — End: 2014-12-11

## 2014-09-12 ENCOUNTER — Ambulatory Visit (INDEPENDENT_AMBULATORY_CARE_PROVIDER_SITE_OTHER): Payer: BLUE CROSS/BLUE SHIELD | Admitting: *Deleted

## 2014-09-12 DIAGNOSIS — B349 Viral infection, unspecified: Secondary | ICD-10-CM

## 2014-09-12 LAB — POC INFLUENZA A&B (BINAX/QUICKVUE)
Influenza A, POC: NEGATIVE
Influenza B, POC: NEGATIVE

## 2014-10-07 ENCOUNTER — Encounter: Payer: Self-pay | Admitting: Internal Medicine

## 2014-11-04 ENCOUNTER — Other Ambulatory Visit: Payer: Self-pay | Admitting: Internal Medicine

## 2014-11-25 ENCOUNTER — Ambulatory Visit: Payer: Self-pay | Admitting: Internal Medicine

## 2014-12-05 ENCOUNTER — Other Ambulatory Visit: Payer: Self-pay | Admitting: Internal Medicine

## 2014-12-05 DIAGNOSIS — E349 Endocrine disorder, unspecified: Secondary | ICD-10-CM

## 2014-12-05 MED ORDER — TESTOSTERONE CYPIONATE 200 MG/ML IM SOLN: 400.0000 mg | INTRAMUSCULAR | Status: AC

## 2014-12-11 ENCOUNTER — Ambulatory Visit (INDEPENDENT_AMBULATORY_CARE_PROVIDER_SITE_OTHER): Payer: BLUE CROSS/BLUE SHIELD | Admitting: Internal Medicine

## 2014-12-11 ENCOUNTER — Encounter: Payer: Self-pay | Admitting: Internal Medicine

## 2014-12-11 VITALS — BP 116/62 | HR 88 | Temp 97.3°F | Resp 16 | Ht 67.0 in | Wt 199.2 lb

## 2014-12-11 DIAGNOSIS — J324 Chronic pansinusitis: Secondary | ICD-10-CM

## 2014-12-11 MED ORDER — LEVOFLOXACIN 500 MG PO TABS: ORAL_TABLET | ORAL | Status: DC

## 2014-12-11 MED ORDER — PREDNISONE 20 MG PO TABS: ORAL_TABLET | ORAL | Status: DC

## 2014-12-11 NOTE — Progress Notes (Signed)
Subjective:    Patient ID: Omar [name], male    DOB: April 24, 1956, 59 y.o.   MRN: [id number]  HPI  patient was tx'd for a upper/lower respiratory infection recently and completed a Z ak about 10 days ago and presents now with persistant sinus pressure and drainage of a putrid drainage. Still has dry cough and occas wheezing/. No fever, chill, dyspnea or rash.   Medication Sig  . aspirin 81 MG tablet Take 81 mg by mouth daily.  Marland Kitchen VITAMIN D5000 UNITS CAPS Take 1 capsule by mouth daily.  Marland Kitchen esomeprazole  40 MG capsule Take 40 mg by mouth 2 (two) times daily.  Marland Kitchen ezetimibe-simvastatin  10-40 MG per tablet Take 1 tablet by mouth at bedtime.  . finasteride ( 5 MG tablet Take 1 tablet (5 mg total) by mouth daily.  . fish oil 1000 MG capsule Take 1 g by mouth 2 (two) times daily.   Marland Kitchen levothyroxine  150 MCG tablet Take 150 mcg by mouth daily.  Marland Kitchen lCLARITIN-D 12-HOUR 5-120 MG per tablet Take 1 tablet by mouth 2 (two) times daily.   . metFORMIN  XR 500 MG 24 hr tablet 1 tablet 2 x day with Bkfst & Lunch and 2 tablets with Supper  . MULTIVITAMIN WITH MINERALS Take 1 tablet by mouth daily.  . ranitidine  300 MG tablet Take 1 tablet 1 to 2 x day for acid indigestion & reflux  . tadalafil (CIALIS) 20 MG tablet 1/2 to 1 tablet every 2 to 3 days as needed for  XXXX  . DEPO-TESTOSTERONE 200 MG/ML inj Inject 2 mLs (400 mg total) into the muscle every 14 (fourteen) days.  Marland Kitchen albuterol  HFA  inhaler Inhale 2 puffs into the lungs every 2 (two) hours as needed for wheezing or shortness of breath (cough).  . prochlorperazine  10 MG tablet 1 tablet 3 x day if needed for nausea / vomitting   No Known Allergies   Past Medical History  Diagnosis Date  . GERD (gastroesophageal reflux disease)   . Sleep apnea 2006    uses a cpap -desated to 55% during study  . Depression   . Complication of anesthesia     Pt states that he experienced some numbness on right side of tongue after surgery for 2 months; continues to  experience gum sensitivity to date  . Headache(784.0)     Sinus headaches  . Tinnitus of both ears     Hx: of  . Hypertension   . Hyperlipidemia   . Arthritis   . DJD (degenerative joint disease)   . Thyroid disease   . Hypothyroidism   . Prediabetes   . Vitamin D deficiency   . Other testicular hypofunction    Past Surgical History  Procedure Laterality Date  . Knee surgery    . Upper gi endoscopy      x2  . Colonoscopy      x2  . Mouth surgery    . Knee arthroscopy  05/12/2012    Procedure: ARTHROSCOPY KNEE;  Surgeon: Alta Corning, MD;  Location: [place];  Service: Orthopedics;  Laterality: Left;  . Cardiac catheterization  03/30/2010    normal coronaries, RCA takeoff of the left coronary cusp, EF 55% [facility])  . Total knee arthroplasty Left 10/16/2012    Dr Berenice Primas  . Knee arthroplasty Left 10/16/2012    Procedure: LEFT COMPUTER ASSISTED TOTAL KNEE ARTHROPLASTY;  Surgeon: Alta Corning, MD;  Location: [facility];  Service: Orthopedics;  Laterality: Left;   Review of Systems In addition to the HPI above,  No Fever-chills,  No changes with Vision or hearing,  No problems swallowing food or Liquids,  No Chest pain or productive Cough or Shortness of Breath,  No Abdominal pain, No Nausea or Vomitting, Bowel movements are regular,  No Blood in stool or Urine,   No new skin rashes or bruises,  No new joints pains-aches,  No new weakness, tingling, numbness in any extremity,  No recent weight loss,  No polyuria, polydypsia or polyphagia,  No significant Mental Stressors.  A full 10 point Review of Systems was done, except as stated above, all other Review of Systems were negative    Objective:   Physical Exam  BP 116/62 mmHg  Pulse 88  Temp(Src) 97.3 F (36.3 C)  Resp 16  Ht 5\' 7"  (1.702 m)  Wt 199 lb 3.2 oz (90.357 kg)  BMI 31.19 kg/m2  HEENT - Eac's patent. TM's Nl. EOM's full. (+) tender frontal/maxillary sinus areas. PERRLA. NasoOroPharynx  clear. Neck - supple. Nl Thyroid. Carotids 2+ & No bruits, nodes, JVD Chest - Clear equal BS w/o Rales, rhonchi, wheezes. Cor - Nl HS. RRR w/o sig MGR. PP 1(+). No edema. MS- FROM w/o deformities. Muscle power, tone and bulk Nl. Gait Nl. Neuro - No obvious Cr N abnormalities. Sensory, motor and Cerebellar functions appear Nl w/o focal abnormalities. Psyche - Mental status normal & appropriate.  No delusions, ideations or obvious mood abnormalities.    Assessment & Plan:   1. Pansinusitis, unspecified chronicity  - predniSONE (DELTASONE) 20 MG tablet; 1 tab 3 x day for 3 days, then 1 tab 2 x day for 3 days, then 1 tab 1 x day for 5 days  Dispense: 20 tablet; Refill: 0 - levofloxacin (LEVAQUIN) 500 MG tablet; Take 1 tablet daily with food for infection  Dispense: 30 tablet; Refill: 0

## 2014-12-11 NOTE — Patient Instructions (Signed)

## 2014-12-12 ENCOUNTER — Ambulatory Visit (HOSPITAL_COMMUNITY)
Admission: RE | Admit: 2014-12-12 | Discharge: 2014-12-12 | Disposition: A | Discharge status: Home / Self Care | Payer: BLUE CROSS/BLUE SHIELD | Source: Ambulatory Visit | Attending: Internal Medicine | Admitting: Internal Medicine

## 2014-12-12 ENCOUNTER — Telehealth: Payer: Self-pay | Admitting: *Deleted

## 2014-12-12 DIAGNOSIS — I10 Essential (primary) hypertension: Secondary | ICD-10-CM | POA: Insufficient documentation

## 2014-12-12 DIAGNOSIS — F17210 Nicotine dependence, cigarettes, uncomplicated: Secondary | ICD-10-CM | POA: Insufficient documentation

## 2014-12-12 NOTE — Telephone Encounter (Signed)
Left message for patient to go get his chest x-ray at [facility] per Dr Melford Aase.

## 2014-12-18 ENCOUNTER — Encounter: Payer: Self-pay | Admitting: Internal Medicine

## 2014-12-18 ENCOUNTER — Ambulatory Visit (INDEPENDENT_AMBULATORY_CARE_PROVIDER_SITE_OTHER): Payer: BLUE CROSS/BLUE SHIELD | Admitting: Internal Medicine

## 2014-12-18 VITALS — BP 116/68 | HR 88 | Temp 97.5°F | Resp 16 | Ht 67.0 in | Wt 199.8 lb

## 2014-12-18 DIAGNOSIS — J0410 Acute tracheitis without obstruction: Secondary | ICD-10-CM

## 2014-12-18 DIAGNOSIS — J324 Chronic pansinusitis: Secondary | ICD-10-CM

## 2014-12-20 NOTE — Progress Notes (Signed)
Subjective:    Patient ID: Omar [name], male    DOB: 1955/12/14, 59 y.o.   MRN: [id number]  HPI Very nice 59 yo MWM seen in f/u recent failed tx of sinusitis with Bertell Maria and was started on 30 tx regimen with Levaquuin on 12/11/2014 for bilat frontal/maxillary sinusitis. He returns today for 1 week f/u feeling definitively improved with minimal sinus drainage, but still with some persistent dry cough.  Denies fever, chills, sweats or skin rashes. Medication Sig  . aspirin 81 MG tablet Take 81 mg by mouth daily.  . Cholecalciferol (VITAMIN D3) 5000 UNITS CAPS Take 1 capsule by mouth daily.  Marland Kitchen esomeprazole (NEXIUM) 40 MG capsule Take 40 mg by mouth 2 (two) times daily.  Marland Kitchen ezetimibe-simvastatin (VYTORIN) 10-40 MG per tablet Take 1 tablet by mouth at bedtime.  . finasteride (PROSCAR) 5 MG tablet Take 1 tablet (5 mg total) by mouth daily.  . fish oil-omega-3 fatty acids 1000 MG capsule Take 1 g by mouth 2 (two) times daily.   Marland Kitchen levofloxacin (LEVAQUIN) 500 MG tablet Take 1 tablet daily with food for infection  . levothyroxine (SYNTHROID, LEVOTHROID) 150 MCG tablet Take 150 mcg by mouth daily.  Marland Kitchen loratadine-pseudoephedrine (CLARITIN-D 12-HOUR) 5-120 MG per tablet Take 1 tablet by mouth 2 (two) times daily.   . metFORMIN (GLUCOPHAGE XR) 500 MG 24 hr tablet 1 tablet 2 x day with Bkfst & Lunch and 2 tablets with Supper  . Multiple Vitamin (MULTIVITAMIN WITH MINERALS) TABS Take 1 tablet by mouth daily.  . predniSONE (DELTASONE) 20 MG tablet 1 tab 3 x day for 3 days, then 1 tab 2 x day for 3 days, then 1 tab 1 x day for 5 days  . ranitidine (ZANTAC) 300 MG tablet Take 1 tablet 1 to 2 x day for acid indigestion & reflux  . tadalafil (CIALIS) 20 MG tablet 1/2 to 1 tablet every 2 to 3 days as needed for  XXXX  . testosterone cypionate (DEPO-TESTOSTERONE) 200 MG/ML injection Inject 2 mLs (400 mg total) into the muscle every 14 (fourteen) days.   No Known Allergies   Past Medical History  Diagnosis Date  .  GERD (gastroesophageal reflux disease)   . Sleep apnea 2006    uses a cpap -desated to 55% during study  . Depression   . Complication of anesthesia     Pt states that he experienced some numbness on right side of tongue after surgery for 2 months; continues to experience gum sensitivity to date  . Headache(784.0)     Sinus headaches  . Tinnitus of both ears     Hx: of  . Hypertension   . Hyperlipidemia   . Arthritis   . DJD (degenerative joint disease)   . Thyroid disease   . Hypothyroidism   . Prediabetes   . Vitamin D deficiency   . Other testicular hypofunction    Review of Systems In addition to the HPI above,  No Fever-chills,  No Headache, No changes with Vision or hearing,  No problems swallowing food or Liquids,  No Chest pain or productive Cough or Shortness of Breath,  No Abdominal pain, No Nausea or Vomitting, Bowel movements are regular,  No Blood in stool or Urine,  No dysuria,  No new skin rashes or bruises,  No new joints pains-aches,  No new weakness, tingling, numbness in any extremity,  No recent weight loss,  No polyuria, polydypsia or polyphagia,  No significant Mental Stressors.  A full  10 point Review of Systems was done, except as stated above, all other Review of Systems were negative    Objective:   Physical Exam  BP 116/68 mmHg  Pulse 88  Temp(Src) 97.5 F (36.4 C)  Resp 16  Ht 5\' 7"  (1.702 m)  Wt 199 lb 12.8 oz (90.629 kg)  BMI 31.29 kg/m2  Dry cough . In No Distress. No rash, cyanosis, or clubbing.  HEENT - Eac's patent. TM's Nl. EOM's full. PERRLA. NasoOroPharynx clear. Very slight max & frontal tenderness Neck - supple. Nl Thyroid. Carotids 2+ & No bruits, nodes, JVD Chest - Clear equal BS w/few  Rales, no rhonchi, wheezes. Cor - Nl HS. RRR w/o sig MGR. PP 1(+). No edema. Abd - No palpable organomegaly, masses or tenderness. BS nl. MS- FROM w/o deformities. Muscle power, tone and bulk Nl. Gait Nl. Neuro - No obvious Cr N  abnormalities. Sensory, motor and Cerebellar functions appear Nl w/o focal abnormalities. Psyche - Mental status normal & appropriate.  No delusions, ideations or obvious mood abnormalities.    Assessment & Plan:

## 2015-01-01 ENCOUNTER — Other Ambulatory Visit: Payer: Self-pay | Admitting: Internal Medicine

## 2015-01-01 DIAGNOSIS — E349 Endocrine disorder, unspecified: Secondary | ICD-10-CM

## 2015-01-01 MED ORDER — TADALAFIL 20 MG PO TABS: ORAL_TABLET | ORAL | Status: DC

## 2015-01-29 ENCOUNTER — Other Ambulatory Visit: Payer: Self-pay | Admitting: Internal Medicine

## 2015-02-17 ENCOUNTER — Ambulatory Visit (INDEPENDENT_AMBULATORY_CARE_PROVIDER_SITE_OTHER): Payer: BLUE CROSS/BLUE SHIELD | Admitting: Internal Medicine

## 2015-02-17 ENCOUNTER — Encounter: Payer: Self-pay | Admitting: Internal Medicine

## 2015-02-17 VITALS — BP 120/84 | HR 84 | Temp 97.5°F | Resp 16 | Ht 67.0 in | Wt 198.4 lb

## 2015-02-17 DIAGNOSIS — E291 Testicular hypofunction: Secondary | ICD-10-CM | POA: Diagnosis not present

## 2015-02-17 DIAGNOSIS — Z79899 Other long term (current) drug therapy: Secondary | ICD-10-CM

## 2015-02-17 DIAGNOSIS — E785 Hyperlipidemia, unspecified: Secondary | ICD-10-CM | POA: Diagnosis not present

## 2015-02-17 DIAGNOSIS — I10 Essential (primary) hypertension: Secondary | ICD-10-CM

## 2015-02-17 DIAGNOSIS — E349 Endocrine disorder, unspecified: Secondary | ICD-10-CM

## 2015-02-17 DIAGNOSIS — E1122 Type 2 diabetes mellitus with diabetic chronic kidney disease: Secondary | ICD-10-CM

## 2015-02-17 DIAGNOSIS — N189 Chronic kidney disease, unspecified: Secondary | ICD-10-CM | POA: Diagnosis not present

## 2015-02-17 DIAGNOSIS — E559 Vitamin D deficiency, unspecified: Secondary | ICD-10-CM | POA: Diagnosis not present

## 2015-02-17 DIAGNOSIS — Z6831 Body mass index (BMI) 31.0-31.9, adult: Secondary | ICD-10-CM

## 2015-02-17 DIAGNOSIS — E079 Disorder of thyroid, unspecified: Secondary | ICD-10-CM | POA: Diagnosis not present

## 2015-02-17 LAB — BASIC METABOLIC PANEL WITH GFR
BUN: 11 mg/dL (ref 7–25)
CO2: 25 mmol/L (ref 20–31)
Calcium: 9.4 mg/dL (ref 8.6–10.3)
Chloride: 101 mmol/L (ref 98–110)
Creat: 1.06 mg/dL (ref 0.70–1.33)
GFR, Est African American: >89 mL/min (ref >=60)
GFR, Est Non African American: 78 mL/min (ref >=60)
Glucose, Bld: 133 mg/dL — ABNORMAL HIGH (ref 65–99)
Potassium: 4.2 mmol/L (ref 3.5–5.3)
Sodium: 142 mmol/L (ref 135–146)

## 2015-02-17 LAB — CBC WITH DIFFERENTIAL/PLATELET
Basophils Absolute: 0.1 10*3/uL (ref 0.0–0.1)
Basophils Relative: 1 % (ref 0–1)
Eosinophils Absolute: 0.1 10*3/uL (ref 0.0–0.7)
Eosinophils Relative: 2 % (ref 0–5)
HCT: 50.2 % (ref 39.0–52.0)
Hemoglobin: 17.1 g/dL — ABNORMAL HIGH (ref 13.0–17.0)
Lymphocytes Relative: 35 % (ref 12–46)
Lymphs Abs: 2.6 10*3/uL (ref 0.7–4.0)
MCH: 29.5 pg (ref 26.0–34.0)
MCHC: 34.1 g/dL (ref 30.0–36.0)
MCV: 86.6 fL (ref 78.0–100.0)
MPV: 8.9 fL (ref 8.6–12.4)
Monocytes Absolute: 0.5 10*3/uL (ref 0.1–1.0)
Monocytes Relative: 7 % (ref 3–12)
Neutro Abs: 4 10*3/uL (ref 1.7–7.7)
Neutrophils Relative %: 55 % (ref 43–77)
Platelets: 239 10*3/uL (ref 150–400)
RBC: 5.8 MIL/uL (ref 4.22–5.81)
RDW: 14.9 % (ref 11.5–15.5)
WBC: 7.3 10*3/uL (ref 4.0–10.5)

## 2015-02-17 LAB — LIPID PANEL
Cholesterol: 200 mg/dL (ref 125–200)
HDL: 51 mg/dL (ref >=40)
LDL Cholesterol: 87 mg/dL (ref <130)
Total CHOL/HDL Ratio: 3.9 Ratio (ref <=5.0)
Triglycerides: 309 mg/dL — ABNORMAL HIGH (ref <150)
VLDL: 62 mg/dL — ABNORMAL HIGH (ref <30)

## 2015-02-17 LAB — HEPATIC FUNCTION PANEL
ALT: 22 U/L (ref 9–46)
AST: 18 U/L (ref 10–35)
Albumin: 4.2 g/dL (ref 3.6–5.1)
Alkaline Phosphatase: 42 U/L (ref 40–115)
Bilirubin, Direct: 0.1 mg/dL (ref <=0.2)
Indirect Bilirubin: 0.4 mg/dL (ref 0.2–1.2)
Total Bilirubin: 0.5 mg/dL (ref 0.2–1.2)
Total Protein: 6.8 g/dL (ref 6.1–8.1)

## 2015-02-17 LAB — MAGNESIUM: Magnesium: 1.9 mg/dL (ref 1.5–2.5)

## 2015-02-17 NOTE — Progress Notes (Signed)
Patient ID: Omar [name], male   DOB: 11/01/1955, 59 y.o.   MRN: [id number]   This very nice 59 y.o. MWM presents for 6 month follow up with Hypertension, Hyperlipidemia, Pre-Diabetes and Vitamin D Deficiency. Patient also is on CPAP for OSA with improvement in energy and sleep hygiene as well as sense of well-being.    Patient is treated for HTN & BP has been controlled at home. Today's BP: 120/84 mmHg. In 2011 he had a false (+) Cardiolite scan with a negative/Normal heart cath. Patient has had no complaints of any cardiac type chest pain, palpitations, dyspnea/orthopnea/PND, dizziness, claudication, or dependent edema.   Hyperlipidemia is controlled with diet & meds. Patient denies myalgias or other med SE's. Last Lipids were at goal -  Cholesterol 148; HDL 48; LDL 67; Triglycerides 167 on 08/22/2014.   Also, the patient has history of Morbid Obesity (BMI 1.35) and consequent T2_NIDDM w/CKD 2 (GFR 72 ml/min) and he's had no symptoms of reactive hypoglycemia, diabetic polys, paresthesias or visual blurring. He is not monitoring CBG's.  Last A1c was 7.3% on 08/22/2014.  Patient also has diffuse DJD with painful artyhralgias limiting and compromising his mobility/sctivities and is currentl on a regimen of QOD steroids and titration slowly down as able and limited by pain. He does report increased mobility/activity with treatment. He is well informed and cognizant of the perils and consequences of long term steroid use.    Patient has Testosterone deficiency with a low level of 115.09 in 2008 and has been on replacement therapy since with improved sense of well-being.  Further, the patient also has history of Vitamin D Deficiency of 27 in 2008 and supplements vitamin D without any suspected side-effects. Last vitamin D was 54 on 08/22/2014.  Medication Sig  . aspirin 81 MG tablet Take 81 mg by mouth daily.  Marland Kitchen VITAMIN D 5000 UNITS  Take 1 capsule by mouth daily.  Marland Kitchen esomeprazole  40 MG capsule Take 40 mg  by mouth 2 (two) times daily.  Marland Kitchen ezetimibe-simvastatin (VYTORIN) 10-40 MG Take 1 tablet by mouth at bedtime.  . finasteride 5 MG tablet Take 1 tablet (5 mg total) by mouth daily.  . fish oil 1000 MG capsule Take 1 g by mouth 2 (two) times daily.   Marland Kitchen levothyroxine  150 MCG tablet Take 150 mcg by mouth daily.  Marland Kitchen loratadine-pseudoephedrine  12-HOUR / 5-120 MG  Take 1 tablet by mouth 2 (two) times daily.   . metFORMIN -XR) 500 MG 24 hr TAKE 1 TABLET TWICE DAILY WITH BREAKFAST AND LUNCH, AND 2 TABLETS WITH SUPPER  . MULTIVITAMIN WITH MINERALS Take 1 tablet by mouth daily.  . ranitidine  300 MG tablet Take 1 tablet 1 to 2 x day for acid indigestion & reflux  . tadalafil  20 MG tablet 1/2 to 1 tablet every 2 to 3 days as needed for  XXXX  . DEPO-TESTOSTERONE 200 MG/ML inj Inject 2 mLs (400 mg total) into the muscle every 14 (fourteen) days.  . predniSONE  10 MG tablet  Takes 1.5 tab qod   No Known Allergies  PMHx:   Past Medical History  Diagnosis Date  . GERD (gastroesophageal reflux disease)   . Sleep apnea 2006    uses a cpap -desated to 55% during study  . Depression   . Complication of anesthesia     Pt states that he experienced some numbness on right side of tongue after surgery for 2 months; continues to experience gum  sensitivity to date  . Headache(784.0)     Sinus headaches  . Tinnitus of both ears     Hx: of  . Hypertension   . Hyperlipidemia   . Arthritis   . DJD (degenerative joint disease)   . Thyroid disease   . Hypothyroidism   . Prediabetes   . Vitamin D deficiency   . Other testicular hypofunction    Immunization History  Administered Date(s) Administered  . Influenza Split 04/18/2013  . Pneumococcal Polysaccharide-23 04/12/2011  . Tdap 04/12/2011   Past Surgical History  Procedure Laterality Date  . Knee surgery    . Upper gi endoscopy      x2  . Colonoscopy      x2  . Mouth surgery    . Knee arthroscopy  05/12/2012    Procedure: ARTHROSCOPY KNEE;   Surgeon: Alta Corning, MD;  Location: [facility];  Service: Orthopedics;  Laterality: Left;  . Cardiac catheterization  03/30/2010    normal coronaries, RCA takeoff of the left coronary cusp, EF 55% [facility])  . Total knee arthroplasty Left 10/16/2012    Dr Berenice Primas  . Knee arthroplasty Left 10/16/2012    Procedure: LEFT COMPUTER ASSISTED TOTAL KNEE ARTHROPLASTY;  Surgeon: Alta Corning, MD;  Location: [facility];  Service: Orthopedics;  Laterality: Left;   FHx:    Reviewed / unchanged  SHx:    Reviewed / unchanged  Systems Review:  Constitutional: Denies fever, chills, wt changes, headaches, insomnia, fatigue, night sweats, change in appetite. Eyes: Denies redness, blurred vision, diplopia, discharge, itchy, watery eyes.  ENT: Denies discharge, congestion, post nasal drip, epistaxis, sore throat, earache, hearing loss, dental pain, tinnitus, vertigo, sinus pain, snoring.  CV: Denies chest pain, palpitations, irregular heartbeat, syncope, dyspnea, diaphoresis, orthopnea, PND, claudication or edema. Respiratory: denies cough, dyspnea, DOE, pleurisy, hoarseness, laryngitis, wheezing.  Gastrointestinal: Denies dysphagia, odynophagia, heartburn, reflux, water brash, abdominal pain or cramps, nausea, vomiting, bloating, diarrhea, constipation, hematemesis, melena, hematochezia  or hemorrhoids. Genitourinary: Denies dysuria, frequency, urgency, nocturia, hesitancy, discharge, hematuria or flank pain. Musculoskeletal: Denies arthralgias, myalgias, stiffness, jt. swelling, pain, limping or strain/sprain.  Skin: Denies pruritus, rash, hives, warts, acne, eczema or change in skin lesion(s). Neuro: No weakness, tremor, incoordination, spasms, paresthesia or pain. Psychiatric: Denies confusion, memory loss or sensory loss. Endo: Denies change in weight, skin or hair change.  Heme/Lymph: No excessive bleeding, bruising or enlarged lymph nodes.  Physical Exam  BP 120/84   Pulse 84  Temp  97.5 F   Resp 16  Ht 5\' 7"    Wt 198 lb 6.4 oz     BMI 31.07   Appears over nourished and in no distress.  Eyes: PERRLA, EOMs, conjunctiva no swelling or erythema. Sinuses: No frontal/maxillary tenderness ENT/Mouth: EAC's clear, TM's nl w/o erythema, bulging. Nares clear w/o erythema, swelling, exudates. Oropharynx clear without erythema or exudates. Oral hygiene is good. Tongue normal, non obstructing. Hearing intact.  Neck: Supple. Thyroid nl. Car 2+/2+ without bruits, nodes or JVD. Chest: Respirations nl with BS clear & equal w/o rales, rhonchi, wheezing or stridor.  Cor: Heart sounds normal w/ regular rate and rhythm without sig. murmurs, gallops, clicks, or rubs. Peripheral pulses normal and equal  without edema.  Abdomen: Soft & bowel sounds normal. Non-tender w/o guarding, rebound, hernias, masses, or organomegaly.  Lymphatics: Unremarkable.  Musculoskeletal: Full ROM all peripheral extremities, joint stability, 5/5 strength, and normal gait.  Skin: Warm, dry without exposed rashes, lesions or ecchymosis apparent.  Neuro:  Cranial nerves intact, reflexes equal bilaterally. Sensory-motor testing grossly intact. Tendon reflexes grossly intact.  Pysch: Alert & oriented x 3.  Insight and judgement nl & appropriate. No ideations.  Assessment and Plan:  1. Hyperlipidemia  - Lipid panel  2. Type 2 diabetes mellitus with diabetic chronic kidney disease  - Hemoglobin A1c - Insulin, random  3. Vitamin D deficiency  - Vit D  25 hydroxy (rtn osteoporosis monitoring)  4. Hypothyroidism   5. Testosterone deficiency  - Testosterone  6. Morbid obesity (BMI 31.35)   7. Medication management  - CBC with Differential/Platelet - BASIC METABOLIC PANEL WITH GFR - Hepatic function panel - Magnesium  8. Essential hypertension  - TSH  9. BMI 31.0-31.9,adult   Recommended regular exercise, BP monitoring, weight control, and discussed med and SE's. Recommended labs to assess and  monitor clinical status. Further disposition pending results of labs. Over 30 minutes of exam, counseling, chart review was performed

## 2015-02-17 NOTE — Patient Instructions (Signed)

## 2015-02-18 LAB — HEMOGLOBIN A1C
Hgb A1c MFr Bld: 7.8 % — ABNORMAL HIGH (ref <5.7)
Mean Plasma Glucose: 177 mg/dL — ABNORMAL HIGH (ref <117)

## 2015-02-18 LAB — INSULIN, RANDOM: Insulin: 15.6 u[IU]/mL (ref 2.0–19.6)

## 2015-02-18 LAB — TSH: TSH: 0.5 u[IU]/mL (ref 0.350–4.500)

## 2015-02-18 LAB — VITAMIN D 25 HYDROXY (VIT D DEFICIENCY, FRACTURES): Vit D, 25-Hydroxy: 51 ng/mL (ref 30–100)

## 2015-02-18 LAB — TESTOSTERONE: Testosterone: 493 ng/dL (ref 300–890)

## 2015-05-05 ENCOUNTER — Encounter: Payer: Self-pay | Admitting: Internal Medicine

## 2015-05-05 ENCOUNTER — Ambulatory Visit (INDEPENDENT_AMBULATORY_CARE_PROVIDER_SITE_OTHER): Payer: BLUE CROSS/BLUE SHIELD | Admitting: Internal Medicine

## 2015-05-05 VITALS — BP 118/72 | HR 102 | Temp 98.2°F | Resp 18 | Ht 67.0 in | Wt 202.0 lb

## 2015-05-05 DIAGNOSIS — R197 Diarrhea, unspecified: Secondary | ICD-10-CM

## 2015-05-05 MED ORDER — DICYCLOMINE HCL 20 MG PO TABS: 20.0000 mg | ORAL_TABLET | Freq: Three times a day (TID) | ORAL | Status: DC

## 2015-05-05 NOTE — Patient Instructions (Signed)
Please stick to a brat diet for now.  Breads, rice, apple sauce and toast.  bannanas are okay too.   Please take the bentyl as needed for diarrhea.  You can also take immodium with it.    Please use either flonase, nasacort, or rhinocort to try to help with the fluid in your ears and your nose.

## 2015-05-05 NOTE — Progress Notes (Signed)
   Subjective:    Patient ID: Omar [name], male    DOB: May 03, 1956, 59 y.o.   MRN: [id number]  Diarrhea  Pertinent negatives include no chills, fever or vomiting.  Patient presents to the office for evaluation of diarrhea x 2 days.  He reports that he had an episode of diarrhea on Saturday and has been having episodes every 2 hours since then.  Stool is watery and brown.  He is not being woken up during the night time.  No blood in his stool no black color to his stool.  Nobody else has had it.  He thinks that it may be due to bad bbq.  He reports that a lot of people around his wife had a similar illness.  He reports no stomach pains.  He reports that appetite has been normal.  He reports that he has been avoiding caffeine.  He has not taken any medications at this point.     He reports that his left ear has really been bothering him lately on and off for years.  He feels like it is muffled and painful.  He takes claritin D daily.      Review of Systems  Constitutional: Negative for fever, chills and fatigue.  HENT: Positive for ear pain, postnasal drip and tinnitus. Negative for congestion, ear discharge, rhinorrhea, sneezing, trouble swallowing and voice change.   Gastrointestinal: Positive for diarrhea. Negative for nausea, vomiting, constipation, blood in stool, abdominal distention and anal bleeding.       Objective:   Physical Exam  Constitutional: He is oriented to person, place, and time. He appears well-developed and well-nourished. No distress.  HENT:  Head: Normocephalic.  Mouth/Throat: Oropharynx is clear and moist. No oropharyngeal exudate.  Eyes: Conjunctivae are normal. No scleral icterus.  Neck: Normal range of motion. Neck supple. No JVD present. No thyromegaly present.  Cardiovascular: Normal rate, regular rhythm and intact distal pulses.  Exam reveals no gallop and no friction rub.   No murmur heard. Pulmonary/Chest: Effort normal and breath sounds normal. No  respiratory distress. He has no wheezes. He has no rales. He exhibits no tenderness.  Abdominal: Soft. Bowel sounds are normal. He exhibits no distension and no mass. There is no tenderness. There is no rebound and no guarding.  Musculoskeletal: Normal range of motion.  Lymphadenopathy:    He has no cervical adenopathy.  Neurological: He is alert and oriented to person, place, and time. No cranial nerve deficit. Coordination normal.  Skin: Skin is warm and dry. He is not diaphoretic.  Psychiatric: He has a normal mood and affect. His behavior is normal. Judgment and thought content normal.  Nursing note and vitals reviewed.   Filed Vitals:   05/05/15 1517  BP: 118/72  Pulse: 102  Temp: 98.2 F (36.8 C)  Resp: 18          Assessment & Plan:    1. Diarrhea, unspecified type -bentyl -immodium -may be nicotine withdrawal

## 2015-05-12 ENCOUNTER — Encounter: Payer: Self-pay | Admitting: Internal Medicine

## 2015-05-12 ENCOUNTER — Ambulatory Visit (INDEPENDENT_AMBULATORY_CARE_PROVIDER_SITE_OTHER): Payer: BLUE CROSS/BLUE SHIELD | Admitting: Internal Medicine

## 2015-05-12 VITALS — BP 122/66 | HR 98 | Temp 98.2°F | Resp 18 | Ht 67.0 in | Wt 203.0 lb

## 2015-05-12 DIAGNOSIS — R6889 Other general symptoms and signs: Secondary | ICD-10-CM

## 2015-05-12 DIAGNOSIS — E1121 Type 2 diabetes mellitus with diabetic nephropathy: Secondary | ICD-10-CM

## 2015-05-12 DIAGNOSIS — E785 Hyperlipidemia, unspecified: Secondary | ICD-10-CM

## 2015-05-12 DIAGNOSIS — K521 Toxic gastroenteritis and colitis: Secondary | ICD-10-CM

## 2015-05-12 DIAGNOSIS — Z0001 Encounter for general adult medical examination with abnormal findings: Secondary | ICD-10-CM

## 2015-05-12 DIAGNOSIS — Z79899 Other long term (current) drug therapy: Secondary | ICD-10-CM

## 2015-05-12 DIAGNOSIS — Z125 Encounter for screening for malignant neoplasm of prostate: Secondary | ICD-10-CM | POA: Diagnosis not present

## 2015-05-12 DIAGNOSIS — Z87891 Personal history of nicotine dependence: Secondary | ICD-10-CM

## 2015-05-12 DIAGNOSIS — M1990 Unspecified osteoarthritis, unspecified site: Secondary | ICD-10-CM

## 2015-05-12 DIAGNOSIS — E079 Disorder of thyroid, unspecified: Secondary | ICD-10-CM | POA: Diagnosis not present

## 2015-05-12 DIAGNOSIS — G4733 Obstructive sleep apnea (adult) (pediatric): Secondary | ICD-10-CM

## 2015-05-12 DIAGNOSIS — E291 Testicular hypofunction: Secondary | ICD-10-CM

## 2015-05-12 DIAGNOSIS — Z1212 Encounter for screening for malignant neoplasm of rectum: Secondary | ICD-10-CM

## 2015-05-12 DIAGNOSIS — I10 Essential (primary) hypertension: Secondary | ICD-10-CM

## 2015-05-12 DIAGNOSIS — Z6831 Body mass index (BMI) 31.0-31.9, adult: Secondary | ICD-10-CM

## 2015-05-12 DIAGNOSIS — E559 Vitamin D deficiency, unspecified: Secondary | ICD-10-CM

## 2015-05-12 DIAGNOSIS — Z9989 Dependence on other enabling machines and devices: Secondary | ICD-10-CM

## 2015-05-12 DIAGNOSIS — E349 Endocrine disorder, unspecified: Secondary | ICD-10-CM

## 2015-05-12 DIAGNOSIS — R5383 Other fatigue: Secondary | ICD-10-CM

## 2015-05-12 DIAGNOSIS — Z6829 Body mass index (BMI) 29.0-29.9, adult: Secondary | ICD-10-CM | POA: Insufficient documentation

## 2015-05-12 LAB — CBC WITH DIFFERENTIAL/PLATELET
Basophils Absolute: 0.1 10*3/uL (ref 0.0–0.1)
Basophils Relative: 1 % (ref 0–1)
Eosinophils Absolute: 0.6 10*3/uL (ref 0.0–0.7)
Eosinophils Relative: 7 % — ABNORMAL HIGH (ref 0–5)
HCT: 47.8 % (ref 39.0–52.0)
Hemoglobin: 16.8 g/dL (ref 13.0–17.0)
Lymphocytes Relative: 42 % (ref 12–46)
Lymphs Abs: 3.4 10*3/uL (ref 0.7–4.0)
MCH: 30 pg (ref 26.0–34.0)
MCHC: 35.1 g/dL (ref 30.0–36.0)
MCV: 85.4 fL (ref 78.0–100.0)
MPV: 9.4 fL (ref 8.6–12.4)
Monocytes Absolute: 0.7 10*3/uL (ref 0.1–1.0)
Monocytes Relative: 9 % (ref 3–12)
Neutro Abs: 3.3 10*3/uL (ref 1.7–7.7)
Neutrophils Relative %: 41 % — ABNORMAL LOW (ref 43–77)
Platelets: 235 10*3/uL (ref 150–400)
RBC: 5.6 MIL/uL (ref 4.22–5.81)
RDW: 14.1 % (ref 11.5–15.5)
WBC: 8 10*3/uL (ref 4.0–10.5)

## 2015-05-12 MED ORDER — METRONIDAZOLE 500 MG PO TABS: ORAL_TABLET | ORAL | Status: DC

## 2015-05-12 MED ORDER — CIPROFLOXACIN HCL 500 MG PO TABS: ORAL_TABLET | ORAL | Status: DC

## 2015-05-12 NOTE — Patient Instructions (Signed)
Recommend Adult Low Dose Aspirin or   coated  Aspirin 81 mg daily   To reduce risk of Colon Cancer 20 %,   Skin Cancer 26 % ,   Melanoma 46%   and   Pancreatic cancer 60%   ++++++++++++++++++++++++++++++++++++++++++++++++++++++  Vitamin D goal   is between 70-100.   Please make sure that you are taking your Vitamin D as directed.   It is very important as a natural anti-inflammatory   helping hair, skin, and nails, as well as reducing stroke and heart attack risk.   It helps your bones and helps with mood.  It also decreases numerous cancer risks so please take it as directed.   Low Vit D is associated with a 200-300% higher risk for CANCER   and 200-300% higher risk for HEART   ATTACK  &  STROKE.   ......................................  It is also associated with higher death rate at younger ages,   autoimmune diseases like Rheumatoid arthritis, Lupus, Multiple Sclerosis.     Also many other serious conditions, like depression, Alzheimer's  Dementia, infertility, muscle aches, fatigue, fibromyalgia - just to name a few.  ++++++++++++++++++++++++++++++++++++++++++++++++  Recommend the book "The END of DIETING" by Dr Joel Fuhrman   & the book "The END of DIABETES " by Dr Joel Fuhrman  At [employer] - get book & Audio CD's     Being diabetic has a  300% increased risk for heart attack, stroke, cancer, and alzheimer- type vascular dementia. It is very important that you work harder with diet by avoiding all foods that are white. Avoid white rice (brown & wild rice is OK), white potatoes (sweetpotatoes in moderation is OK), White bread or wheat bread or anything made out of white flour like bagels, donuts, rolls, buns, biscuits, cakes, pastries, cookies, pizza crust, and pasta (made from white flour & egg whites) - vegetarian pasta or spinach or wheat pasta is OK. Multigrain breads like Arnold's or [employer], or multigrain sandwich thins or flatbreads.  Diet,  exercise and weight loss can reverse and cure diabetes in the early stages.  Diet, exercise and weight loss is very important in the control and prevention of complications of diabetes which affects every system in your body, ie. Brain - dementia/stroke, eyes - glaucoma/blindness, heart - heart attack/heart failure, kidneys - dialysis, stomach - gastric paralysis, intestines - malabsorption, nerves - severe painful neuritis, circulation - gangrene & loss of a leg(s), and finally cancer and Alzheimers.    I recommend avoid fried & greasy foods,  sweets/candy, white rice (brown or wild rice or Quinoa is OK), white potatoes (sweet potatoes are OK) - anything made from white flour - bagels, doughnuts, rolls, buns, biscuits,white and wheat breads, pizza crust and traditional pasta made of white flour & egg white(vegetarian pasta or spinach or wheat pasta is OK).  Multi-grain bread is OK - like multi-grain flat bread or sandwich thins. Avoid alcohol in excess. Exercise is also important.    Eat all the vegetables you want - avoid meat, especially red meat and dairy - especially cheese.  Cheese is the most concentrated form of trans-fats which is the worst thing to clog up our arteries. Veggie cheese is OK which can be found in the fresh produce section at [employer] or [employer] or [employer]  ++++++++++++++++++++++++++++++++++++++++++++++++++ DASH Eating Plan  DASH stands for "Dietary Approaches to Stop Hypertension."   The DASH eating plan is a healthy eating plan that has been shown to reduce high   blood pressure (hypertension). Additional health benefits [place] include reducing the risk of type 2 diabetes mellitus, heart disease, and stroke. The DASH eating plan Poet also help with weight loss.  WHAT DO I NEED TO KNOW ABOUT THE DASH EATING PLAN? For the DASH eating plan, you will follow these general guidelines:  Choose foods with a percent daily value for sodium of less than 5% (as listed on the food  label).  Use salt-free seasonings or herbs instead of table salt or sea salt.  Check with your health care provider or pharmacist before using salt substitutes.  Eat lower-sodium products, often labeled as "lower sodium" or "no salt added."  Eat fresh foods.  Eat more vegetables, fruits, and low-fat dairy products.    Choose whole grains. Look for the word "whole" as the first word in the ingredient list.  Choose fish   Limit sweets, desserts, sugars, and sugary drinks.  Choose heart-healthy fats.  Eat veggie cheese   Eat more home-cooked food and less restaurant, buffet, and fast food.  Limit fried foods.  Cook foods using methods other than frying.  Limit canned vegetables. If you do use them, rinse them well to decrease the sodium.  When eating at a restaurant, ask that your food be prepared with less salt, or no salt if possible.                      WHAT FOODS CAN I EAT?  Seek help from a dietitian for individual calorie needs. Grains Whole grain or whole wheat bread. Brown rice. Whole grain or whole wheat pasta. Quinoa, bulgur, and whole grain cereals. Low-sodium cereals. Corn or whole wheat flour tortillas. Whole grain cornbread. Whole grain crackers. Low-sodium crackers.  Vegetables Fresh or frozen vegetables (raw, steamed, roasted, or grilled). Low-sodium or reduced-sodium tomato and vegetable juices. Low-sodium or reduced-sodium tomato sauce and paste. Low-sodium or reduced-sodium canned vegetables.   Fruits All fresh, canned (in natural juice), or frozen fruits.  Meat and Other Protein Products  All fish and seafood.  Dried beans, peas, or lentils. Unsalted nuts and seeds. Unsalted canned beans. Dairy Low-fat dairy products, such as skim or 1% milk, 2% or reduced-fat cheeses, low-fat ricotta or cottage cheese, or plain low-fat yogurt. Low-sodium or reduced-sodium cheeses.  Fats and Oils Tub margarines without trans fats. Light or reduced-fat mayonnaise  and salad dressings (reduced sodium). Avocado. Safflower, olive, or canola oils. Natural peanut or almond butter.  Other Unsalted popcorn and pretzels. The items listed above [place] not be a complete list of recommended foods or beverages. Contact your dietitian for more options.  +++++++++++++++++++++++++++++++++++++++++++  WHAT FOODS ARE NOT RECOMMENDED?  Grains/ White flour or wheat flour  White bread. White pasta. White rice. Refined cornbread. Bagels and croissants. Crackers that contain trans fat.  Vegetables  Creamed or fried vegetables. Vegetables in a . Regular canned vegetables. Regular canned tomato sauce and paste. Regular tomato and vegetable juices.  Fruits Dried fruits. Canned fruit in light or heavy syrup. Fruit juice.  Meat and Other Protein Products Meat in general. Fatty cuts of meat. Ribs, chicken wings, bacon, sausage, bologna, salami, chitterlings, fatback, hot dogs, bratwurst, and packaged luncheon meats. Salted nuts and seeds. Canned beans with salt.  Dairy Whole or 2% milk, cream, half-and-half, and cream cheese. Whole-fat or sweetened yogurt. Full-fat cheeses or blue cheese. Nondairy creamers and whipped toppings. Processed cheese, cheese spreads, or cheese curds.  Condiments Onion and garlic salt, seasoned salt, table salt, and sea  salt. Canned and packaged gravies. Worcestershire sauce. Tartar sauce. Barbecue sauce. Teriyaki sauce. Soy sauce, including reduced sodium. Steak sauce. Fish sauce. Oyster sauce. Cocktail sauce. Horseradish. Ketchup and mustard. Meat flavorings and tenderizers. Bouillon cubes. Hot sauce. Tabasco sauce. Marinades. Taco seasonings. Relishes.  Fats and Oils Butter, stick margarine, lard, shortening, ghee, and bacon fat. Coconut, palm kernel, or palm oils. Regular salad dressings.  Pickles and olives. Salted popcorn and pretzels. The items listed above may not be a complete list of foods and beverages to avoid.   Preventive Care for  Adults  A healthy lifestyle and preventive care can promote health and wellness. Preventive health guidelines for men include the following key practices:  A routine yearly physical is a good way to check with your health care provider about your health and preventative screening. It is a chance to share any concerns and updates on your health and to receive a thorough exam.  Visit your dentist for a routine exam and preventative care every 6 months. Brush your teeth twice a day and floss once a day. Good oral hygiene prevents tooth decay and gum disease.  The frequency of eye exams is based on your age, health, family medical history, use of contact lenses, and other factors. Follow your health care provider's recommendations for frequency of eye exams.  Eat a healthy diet. Foods such as vegetables, fruits, whole grains, low-fat dairy products, and lean protein foods contain the nutrients you need without too many calories. Decrease your intake of foods high in solid fats, added sugars, and salt. Eat the right amount of calories for you.Get information about a proper diet from your health care provider, if necessary.  Regular physical exercise is one of the most important things you can do for your health. Most adults should get at least 150 minutes of moderate-intensity exercise (any activity that increases your heart rate and causes you to sweat) each week. In addition, most adults need muscle-strengthening exercises on 2 or more days a week.  Maintain a healthy weight. The body mass index (BMI) is a screening tool to identify possible weight problems. It provides an estimate of body fat based on height and weight. Your health care provider can find your BMI and can help you achieve or maintain a healthy weight.For adults 20 years and older:  A BMI below 18.5 is considered underweight.  A BMI of 18.5 to 24.9 is normal.  A BMI of 25 to 29.9 is considered overweight.  A BMI of 30 and above  is considered obese.  Maintain normal blood lipids and cholesterol levels by exercising and minimizing your intake of saturated fat. Eat a balanced diet with plenty of fruit and vegetables. Blood tests for lipids and cholesterol should begin at age 20 and be repeated every 5 years. If your lipid or cholesterol levels are high, you are over 50, or you are at high risk for heart disease, you may need your cholesterol levels checked more frequently.Ongoing high lipid and cholesterol levels should be treated with medicines if diet and exercise are not working.  If you smoke, find out from your health care provider how to quit. If you do not use tobacco, do not start.  Lung cancer screening is recommended for adults aged 55-80 years who are at high risk for developing lung cancer because of a history of smoking. A yearly low-dose CT scan of the lungs is recommended for people who have at least a 30-pack-year history of smoking and are a   current smoker or have quit within the past 15 years. A pack year of smoking is smoking an average of 1 pack of cigarettes a day for 1 year (for example: 1 pack a day for 30 years or 2 packs a day for 15 years). Yearly screening should continue until the smoker has stopped smoking for at least 15 years. Yearly screening should be stopped for people who develop a health problem that would prevent them from having lung cancer treatment.  If you choose to drink alcohol, do not have more than 2 drinks per day. One drink is considered to be 12 ounces (355 mL) of beer, 5 ounces (148 mL) of wine, or 1.5 ounces (44 mL) of liquor.  Avoid use of street drugs. Do not share needles with anyone. Ask for help if you need support or instructions about stopping the use of drugs.  High blood pressure causes heart disease and increases the risk of stroke. Your blood pressure should be checked at least every 1-2 years. Ongoing high blood pressure should be treated with medicines, if weight loss  and exercise are not effective.  If you are 61-28 years old, ask your health care provider if you should take aspirin to prevent heart disease.  Diabetes screening involves taking a blood sample to check your fasting blood sugar level. This should be done once every 3 years, after age 2, if you are within normal weight and without risk factors for diabetes. Testing should be considered at a younger age or be carried out more frequently if you are overweight and have at least 1 risk factor for diabetes.  Colorectal cancer can be detected and often prevented. Most routine colorectal cancer screening begins at the age of 80 and continues through age 26. However, your health care provider may recommend screening at an earlier age if you have risk factors for colon cancer. On a yearly basis, your health care provider may provide home test kits to check for hidden blood in the stool. Use of a small camera at the end of a tube to directly examine the colon (sigmoidoscopy or colonoscopy) can detect the earliest forms of colorectal cancer. Talk to your health care provider about this at age 62, when routine screening begins. Direct exam of the colon should be repeated every 5-10 years through age 24, unless early forms of precancerous polyps or small growths are found.   Talk with your health care provider about prostate cancer screening.  Testicular cancer screening isrecommended for adult males. Screening includes self-exam, a health care provider exam, and other screening tests. Consult with your health care provider about any symptoms you have or any concerns you have about testicular cancer.  Use sunscreen. Apply sunscreen liberally and repeatedly throughout the day. You should seek shade when your shadow is shorter than you. Protect yourself by wearing long sleeves, pants, a wide-brimmed hat, and sunglasses year round, whenever you are outdoors.  Once a month, do a whole-body skin exam, using a mirror to  look at the skin on your back. Tell your health care provider about new moles, moles that have irregular borders, moles that are larger than a pencil eraser, or moles that have changed in shape or color.  Stay current with required vaccines (immunizations).  Influenza vaccine. All adults should be immunized every year.  Tetanus, diphtheria, and acellular pertussis (Td, Tdap) vaccine. An adult who has not previously received Tdap or who does not know his vaccine status should receive 1 dose of  Tdap. This initial dose should be followed by tetanus and diphtheria toxoids (Td) booster doses every 10 years. Adults with an unknown or incomplete history of completing a 3-dose immunization series with Td-containing vaccines should begin or complete a primary immunization series including a Tdap dose. Adults should receive a Td booster every 10 years.  Varicella vaccine. An adult without evidence of immunity to varicella should receive 2 doses or a second dose if he has previously received 1 dose.  Human papillomavirus (HPV) vaccine. Males aged 48-21 years who have not received the vaccine previously should receive the 3-dose series. Males aged 22-26 years may be immunized. Immunization is recommended through the age of 70 years for any male who has sex with males and did not get any or all doses earlier. Immunization is recommended for any person with an immunocompromised condition through the age of 38 years if he did not get any or all doses earlier. During the 3-dose series, the second dose should be obtained 4-8 weeks after the first dose. The third dose should be obtained 24 weeks after the first dose and 16 weeks after the second dose.  Zoster vaccine. One dose is recommended for adults aged 75 years or older unless certain conditions are present.    PREVNAR  - Pneumococcal 13-valent conjugate (PCV13) vaccine. When indicated, a person who is uncertain of his immunization history and has no record of  immunization should receive the PCV13 vaccine. An adult aged 50 years or older who has certain medical conditions and has not been previously immunized should receive 1 dose of PCV13 vaccine. This PCV13 should be followed with a dose of pneumococcal polysaccharide (PPSV23) vaccine. The PPSV23 vaccine dose should be obtained at least 8 weeks after the dose of PCV13 vaccine. An adult aged 4 years or older who has certain medical conditions and previously received 1 or more doses of PPSV23 vaccine should receive 1 dose of PCV13. The PCV13 vaccine dose should be obtained 1 or more years after the last PPSV23 vaccine dose.    PNEUMOVAX - Pneumococcal polysaccharide (PPSV23) vaccine. When PCV13 is also indicated, PCV13 should be obtained first. All adults aged 9 years and older should be immunized. An adult younger than age 40 years who has certain medical conditions should be immunized. Any person who resides in a nursing home or long-term care facility should be immunized. An adult smoker should be immunized. People with an immunocompromised condition and certain other conditions should receive both PCV13 and PPSV23 vaccines. People with human immunodeficiency virus (HIV) infection should be immunized as soon as possible after diagnosis. Immunization during chemotherapy or radiation therapy should be avoided. Routine use of PPSV23 vaccine is not recommended for American Indians, [place]atives, or people younger than 65 years unless there are medical conditions that require PPSV23 vaccine. When indicated, people who have unknown immunization and have no record of immunization should receive PPSV23 vaccine. One-time revaccination 5 years after the first dose of PPSV23 is recommended for people aged 19-64 years who have chronic kidney failure, nephrotic syndrome, asplenia, or immunocompromised conditions. People who received 1-2 doses of PPSV23 before age 22 years should receive another dose of PPSV23 vaccine at age  63 years or later if at least 5 years have passed since the previous dose. Doses of PPSV23 are not needed for people immunized with PPSV23 at or after age 67 years.    Hepatitis A vaccine. Adults who wish to be protected from this disease, have certain high-risk conditions,  work with hepatitis A-infected animals, work in hepatitis A research labs, or travel to or work in countries with a high rate of hepatitis A should be immunized. Adults who were previously unvaccinated and who anticipate close contact with an international adoptee during the first 60 days after arrival in the [place] from a country with a high rate of hepatitis A should be immunized.    Hepatitis B vaccine. Adults should be immunized if they wish to be protected from this disease, have certain high-risk conditions, may be exposed to blood or other infectious body fluids, are household contacts or sex partners of hepatitis B positive people, are clients or workers in certain care facilities, or travel to or work in countries with a high rate of hepatitis B.   Preventive Service / Frequency   Ages 16 to 58  Blood pressure check.  Lipid and cholesterol check  Lung cancer screening. / Every year if you are aged 82-80 years and have a 30-pack-year history of smoking and currently smoke or have quit within the past 15 years. Yearly screening is stopped once you have quit smoking for at least 15 years or develop a health problem that would prevent you from having lung cancer treatment.  Fecal occult blood test (FOBT) of stool. / Every year beginning at age 39 and continuing until age 49. You may not have to do this test if you get a colonoscopy every 10 years.  Flexible sigmoidoscopy** or colonoscopy.** / Every 5 years for a flexible sigmoidoscopy or every 10 years for a colonoscopy beginning at age 68 and continuing until age 17. Screening for abdominal aortic aneurysm (AAA)  by ultrasound is recommended for people who  have history of high blood pressure or who are current or former smokers.

## 2015-05-12 NOTE — Progress Notes (Signed)
Patient ID: Omar [name], male   DOB: Oct 03, 1955, 59 y.o.   MRN: [id number]  Annual  Screening/Preventative Visit and  Comprehensive Evaluation,  Examination & Management      This very nice 59 y.o. MWM presents for presents for a Wellness/Preventative Visit & comprehensive evaluation and management of multiple medical co-morbidities.  Patient has been followed for HTN, T2_NIDDM, Hyperlipidemia, and Vitamin D Deficiency.  Patient also is on CPAP for his OSA reporting improved sleep hygiene and less daytime fatigue.       Patient was also recently evaluated for an acute Gastroenteritis and sx's of watery diarrhea have persisted over the last week and in hind sight, patient recalled sx's started after eating some suspect tainted [employer] from an elderly friend.       HTN predates since 2008. Patient's BP has been controlled at home.Today's BP: 122/66 mmHg. Patient denies any cardiac symptoms as chest pain, palpitations, shortness of breath, dizziness or ankle swelling.      Patient's hyperlipidemia is controlled with diet and medications. Patient denies myalgias or other medication SE's. Last lipids were at goal with Cholesterol 200; HDL 51; LDL 87; but with elevated Triglycerides 309 on 02/17/2015.      Patient has Morbid Obesity (BMI 31+) and is not dietary compliant and has consequent T2_NIDDM since 2011 with A1c 6.9% and with CKD 2  and patient denies reactive hypoglycemic symptoms, visual blurring, diabetic polys or paresthesias. Last A1c was not at goal consistent with his poor non-compliant diet with A1c 7.8% on 02/17/2015.      Patient has hx/o Hypothyroidism post RAI Tx of Grave's Dz and has been on replacement therapy about 30 years.  Patient is Testosterone Deficient with a low level of 115.09 in 2008 and has been on parenteral replacement. Finally, patient has history of Vitamin D Deficiency of 27 in 2008 and last vitamin D was 51 on  02/17/2015.  Medication Sig  . aspirin 81 MG tablet Take  81 mg by mouth daily.  Marland Kitchen VITAMIN D 5000 UNITS CAPS Take 1 capsule by mouth daily.  Marland Kitchen dicyclomine  20 MG tablet Take 1 tablet (20 mg total) by mouth 4 (four) times daily -  before meals and at bedtime.  Marland Kitchen esomeprazole 40 MG capsule Take 40 mg by mouth 2 (two) times daily.  Marland Kitchen ezetimibe-simvastatin 10-40 MG  Take 1 tablet by mouth at bedtime.  . finasteride  5 MG tablet Take 1 tablet (5 mg total) by mouth daily.  . fish oil-omega- 1000 MG capsule Take 1 g by mouth 2 (two) times daily.   Marland Kitchen levothyroxine  150 MCG  Take 150 mcg by mouth daily.  Marland Kitchen CLARITIN-D 12-HR  5-120 MG Take 1 tablet by mouth 2 (two) times daily.   . metFORMIN -XR 500 MG 24 hr tablet TAKE 1 TABLET TWICE DAILY WITH BREAKFAST AND LUNCH, AND 2 TABLETS WITH SUPPER  . MULTIVITAMIN WITH MINERALS Take 1 tablet by mouth daily.  . ranitidine (ZANTAC) 300 MG tablet Take 1 tablet 1 to 2 x day for acid indigestion & reflux  . tadalafil (CIALIS) 20 MG tablet 1/2 to 1 tablet every 2 to 3 days as needed for  XXXX  . DEPO-TESTOSTERONE 200 MG/ML inj Inject 2 mLs (400 mg total) into the muscle every 14 (fourteen) days.   No Known Allergies   Past Medical History  Diagnosis Date  . GERD (gastroesophageal reflux disease)   . Sleep apnea 2006    uses a cpap -desated  to 55% during study  . Depression   . Complication of anesthesia     Pt states that he experienced some numbness on right side of tongue after surgery for 2 months; continues to experience gum sensitivity to date  . Headache(784.0)     Sinus headaches  . Tinnitus of both ears     Hx: of  . Hypertension   . Hyperlipidemia   . Arthritis   . DJD (degenerative joint disease)   . Thyroid disease   . Hypothyroidism   . Prediabetes   . Vitamin D deficiency   . Other testicular hypofunction    Health Maintenance  Topic Date Due  . OPHTHALMOLOGY EXAM  06/23/1967  . COLONOSCOPY  06/23/2007  . INFLUENZA VACCINE  02/03/2015  . FOOT EXAM  05/08/2015  . URINE MICROALBUMIN   05/08/2015  . HEMOGLOBIN A1C  08/20/2015  . PNEUMOCOCCAL POLYSACCHARIDE VACCINE (2) 04/11/2016  . TETANUS/TDAP  04/11/2021  . Hepatitis C Screening  Completed  . HIV Screening  Completed   Immunization History  Administered Date(s) Administered  . Influenza Split 04/18/2013  . Pneumococcal Polysaccharide-23 04/12/2011  . Tdap 04/12/2011   Past Surgical History  Procedure Laterality Date  . Knee surgery    . Upper gi endoscopy      x2  . Colonoscopy      x2  . Mouth surgery    . Knee arthroscopy  05/12/2012    Procedure: ARTHROSCOPY KNEE;  Surgeon: Alta Corning, MD;  Location: [place];  Service: Orthopedics;  Laterality: Left;  . Cardiac catheterization  03/30/2010    normal coronaries, RCA takeoff of the left coronary cusp, EF 55% [facility])  . Total knee arthroplasty Left 10/16/2012    Dr Berenice Primas  . Knee arthroplasty Left 10/16/2012    Procedure: LEFT COMPUTER ASSISTED TOTAL KNEE ARTHROPLASTY;  Surgeon: Alta Corning, MD;  Location: [place];  Service: Orthopedics;  Laterality: Left;   Family History  Problem Relation Age of Onset  . Cancer Mother     breast  . Hyperlipidemia Mother   . Hypertension Mother   . Cancer Father     pancreatic   Social History   Social History  . Marital Status: Married    Spouse Name: N/A  . Number of Children: N/A  . Years of Education: N/A   Occupational History  . Not on file.   Social History Main Topics  . Smoking status: Former Smoker -- 1.00 packs/day for 40 years    Types: Cigarettes    Quit date: 04/14/2015  . Smokeless tobacco: Never Used     Comment: "Vaping" daily  . Alcohol Use: 0.6 oz/week    1 Standard drinks or equivalent per week     Comment: rarely  . Drug Use: No  . Sexual Activity: Yes    ROS Constitutional: Denies fever, chills, weight loss/gain, headaches, insomnia,  night sweats or change in appetite. Does c/o fatigue. Eyes: Denies redness, blurred vision, diplopia, discharge, itchy  or watery eyes.  ENT: Denies discharge, congestion, post nasal drip, epistaxis, sore throat, earache, hearing loss, dental pain, Tinnitus, Vertigo, Sinus pain or snoring.  Cardio: Denies chest pain, palpitations, irregular heartbeat, syncope, dyspnea, diaphoresis, orthopnea, PND, claudication or edema Respiratory: denies cough, dyspnea, DOE, pleurisy, hoarseness, laryngitis or wheezing.  Gastrointestinal: Denies dysphagia, heartburn, reflux, water brash, pain, cramps, nausea, vomiting, bloating, diarrhea, constipation, hematemesis, melena, hematochezia, jaundice or hemorrhoids Genitourinary: Denies dysuria, frequency, urgency, nocturia, hesitancy, discharge, hematuria or flank  pain Musculoskeletal: Denies arthralgia, myalgia, stiffness, Jt. Swelling, pain, limp or strain/sprain. Denies Falls. Skin: Denies puritis, rash, hives, warts, acne, eczema or change in skin lesion Neuro: No weakness, tremor, incoordination, spasms, paresthesia or pain Psychiatric: Denies confusion, memory loss or sensory loss. Denies Depression. Endocrine: Denies change in weight, skin, hair change, nocturia, and paresthesia, diabetic polys, visual blurring or hyper / hypo glycemic episodes.  Heme/Lymph: No excessive bleeding, bruising or enlarged lymph nodes.  Physical Exam  BP 122/66 mmHg  Pulse 98  Temp(Src) 98.2 F (36.8 C) (Temporal)  Resp 18  Ht 5\' 7"  (1.702 m)  Wt 203 lb (92.08 kg)  BMI 31.79 kg/m2  General Appearance: Well nourished, in no apparent distress. Eyes: PERRLA, EOMs, conjunctiva no swelling or erythema, normal fundi and vessels. Sinuses: No frontal/maxillary tenderness ENT/Mouth: EACs patent / TMs  nl. Nares clear without erythema, swelling, mucoid exudates. Oral hygiene is good. No erythema, swelling, or exudate. Tongue normal, non-obstructing. Tonsils not swollen or erythematous. Hearing normal.  Neck: Supple, thyroid normal. No bruits, nodes or JVD. Respiratory: Respiratory effort normal.  BS  equal and clear bilateral without rales, rhonci, wheezing or stridor. Cardio: Heart sounds are normal with regular rate and rhythm and no murmurs, rubs or gallops. Peripheral pulses are normal and equal bilaterally without edema. No aortic or femoral bruits. Chest: symmetric with normal excursions and percussion.  Abdomen: Flat, soft, with bowl sounds. Nontender, no guarding, rebound, hernias, masses, or organomegaly.  Lymphatics: Non tender without lymphadenopathy.  Genitourinary: No hernias.Testes nl. DRE - prostate nl for age - smooth & firm w/o nodules. Musculoskeletal: Full ROM all peripheral extremities, joint stability, 5/5 strength, and normal gait. Skin: Warm and dry without rashes, lesions, cyanosis, clubbing or  ecchymosis.  Neuro: Cranial nerves intact, reflexes equal bilaterally. Normal muscle tone, no cerebellar symptoms. Sensation intact by monofilament testing.  Pysch: Awake and oriented X 3 with normal affect, insight and judgment appropriate.   Assessment and Plan  1. Annual Preventative/Screening Exam   1. Encounter for general adult medical examination with abnormal findings  - Microalbumin / creatinine urine ratio - EKG 12-Lead - [place], RETROPERITNL ABD,  LTD - POC Hemoccult Bld/Stl  - Vitamin B12 - PSA - Testosterone - Iron and TIBC - HM DIABETES FOOT EXAM - LOW EXTREMITY NEUR EXAM DOCUM - Urinalysis, Routine w reflex microscopic  - CBC with Differential/Platelet - BASIC METABOLIC PANEL WITH GFR - Hepatic function panel - Magnesium - Lipid panel - TSH - Hemoglobin A1c - Insulin, random - Vit D  25 hydroxy   2. Essential hypertension  - EKG 12-Lead - [place], RETROPERITNL ABD,  LTD - TSH  3. Hyperlipidemia  - Lipid panel  4. Type 2 diabetes mellitus with diabetic nephropathy, without long-term current use of insulin (HCC)  - Microalbumin / creatinine urine ratio - HM DIABETES FOOT EXAM - LOW EXTREMITY NEUR EXAM DOCUM - Hemoglobin A1c - Insulin,  random  5. Vitamin D deficiency  - Vit D  25 hydroxy  6. Hypothyroidism   7. Testosterone deficiency  - Testosterone Level  8. Morbid obesity ([place])   9. OSA on CPAP   10. DJD   11. Screening for rectal cancer  - POC Hemoccult Bld/Stl   12. Prostate cancer screening  - PSA  13. Other fatigue  - Vitamin B12 - Testosterone - Iron and TIBC - TSH  14. BMI 31.0-31.9,adult   15. Toxic gastroenteritis  - metroNIDAZOLE (FLAGYL) 500 MG tablet; Take 1 tablet 3 x day after  meals for infection  Dispense: 30 tablet; Refill: 0 - ciprofloxacin (CIPRO) 500 MG tablet; Take 1 tablet 2 x day after meals for infection  Dispense: 20 tablet; Refill: 0  16. Former moderate cigarette smoker (10-19 per day)  - CT CHEST LUNG CA SCREEN LOW DOSE W/O CM; Future  17. Medication management  - Urinalysis, Routine w reflex microscopic (not at [facility]) - CBC with Differential/Platelet - BASIC METABOLIC PANEL WITH GFR - Hepatic function panel - Magnesium   Continue prudent diet as discussed, weight control, BP monitoring, regular exercise, and medications as discussed.  Discussed med effects and SE's. Routine screening labs and tests as requested with regular follow-up as recommended.

## 2015-05-13 LAB — MAGNESIUM: Magnesium: 1.8 mg/dL (ref 1.5–2.5)

## 2015-05-13 LAB — BASIC METABOLIC PANEL WITH GFR
BUN: 10 mg/dL (ref 7–25)
CO2: 26 mmol/L (ref 20–31)
Calcium: 9.2 mg/dL (ref 8.6–10.3)
Chloride: 100 mmol/L (ref 98–110)
Creat: 1.1 mg/dL (ref 0.70–1.33)
GFR, Est African American: 86 mL/min (ref >=60)
GFR, Est Non African American: 74 mL/min (ref >=60)
Glucose, Bld: 105 mg/dL — ABNORMAL HIGH (ref 65–99)
Potassium: 4 mmol/L (ref 3.5–5.3)
Sodium: 137 mmol/L (ref 135–146)

## 2015-05-13 LAB — MICROALBUMIN / CREATININE URINE RATIO
Creatinine, Urine: 174 mg/dL (ref 20–370)
Microalb Creat Ratio: 4 mcg/mg creat (ref <30)
Microalb, Ur: 0.7 mg/dL

## 2015-05-13 LAB — URINALYSIS, ROUTINE W REFLEX MICROSCOPIC
Bilirubin Urine: NEGATIVE
Hgb urine dipstick: NEGATIVE
Ketones, ur: NEGATIVE
Leukocytes, UA: NEGATIVE
Nitrite: NEGATIVE
Protein, ur: NEGATIVE
Specific Gravity, Urine: 1.018 (ref 1.001–1.035)
pH: 6 (ref 5.0–8.0)

## 2015-05-13 LAB — HEPATIC FUNCTION PANEL
ALT: 21 U/L (ref 9–46)
AST: 20 U/L (ref 10–35)
Albumin: 4.1 g/dL (ref 3.6–5.1)
Alkaline Phosphatase: 50 U/L (ref 40–115)
Bilirubin, Direct: 0.1 mg/dL (ref <=0.2)
Indirect Bilirubin: 0.3 mg/dL (ref 0.2–1.2)
Total Bilirubin: 0.4 mg/dL (ref 0.2–1.2)
Total Protein: 6.4 g/dL (ref 6.1–8.1)

## 2015-05-13 LAB — VITAMIN B12: Vitamin B-12: 517 pg/mL (ref 211–911)

## 2015-05-13 LAB — HEMOGLOBIN A1C
Hgb A1c MFr Bld: 7.7 % — ABNORMAL HIGH (ref <5.7)
Mean Plasma Glucose: 174 mg/dL — ABNORMAL HIGH (ref <117)

## 2015-05-13 LAB — PSA: PSA: 0.95 ng/mL (ref <=4.00)

## 2015-05-13 LAB — IRON AND TIBC
%SAT: 62 % — ABNORMAL HIGH (ref 15–60)
Iron: 183 ug/dL — ABNORMAL HIGH (ref 50–180)
TIBC: 295 ug/dL (ref 250–425)
UIBC: 112 ug/dL — ABNORMAL LOW (ref 125–400)

## 2015-05-13 LAB — LIPID PANEL
Cholesterol: 144 mg/dL (ref 125–200)
HDL: 37 mg/dL — ABNORMAL LOW (ref >=40)
Total CHOL/HDL Ratio: 3.9 Ratio (ref <=5.0)
Triglycerides: 446 mg/dL — ABNORMAL HIGH (ref <150)

## 2015-05-13 LAB — TSH: TSH: 0.869 u[IU]/mL (ref 0.350–4.500)

## 2015-05-13 LAB — INSULIN, RANDOM: Insulin: 20.4 u[IU]/mL — ABNORMAL HIGH (ref 2.0–19.6)

## 2015-05-13 LAB — TESTOSTERONE: Testosterone: 636 ng/dL (ref 300–890)

## 2015-05-13 LAB — VITAMIN D 25 HYDROXY (VIT D DEFICIENCY, FRACTURES): Vit D, 25-Hydroxy: 52 ng/mL (ref 30–100)

## 2015-06-09 ENCOUNTER — Ambulatory Visit
Admission: RE | Admit: 2015-06-09 | Discharge: 2015-06-09 | Disposition: A | Discharge status: Home / Self Care | Payer: BLUE CROSS/BLUE SHIELD | Source: Ambulatory Visit | Attending: Internal Medicine | Admitting: Internal Medicine

## 2015-06-09 DIAGNOSIS — Z87891 Personal history of nicotine dependence: Secondary | ICD-10-CM

## 2015-06-10 ENCOUNTER — Encounter: Payer: Self-pay | Admitting: Internal Medicine

## 2015-06-16 ENCOUNTER — Encounter: Payer: Self-pay | Admitting: Internal Medicine

## 2015-06-16 ENCOUNTER — Ambulatory Visit (INDEPENDENT_AMBULATORY_CARE_PROVIDER_SITE_OTHER): Payer: BLUE CROSS/BLUE SHIELD | Admitting: Internal Medicine

## 2015-06-16 VITALS — BP 120/72 | HR 88 | Temp 97.7°F | Resp 16 | Ht 67.0 in | Wt 200.6 lb

## 2015-06-16 DIAGNOSIS — J0410 Acute tracheitis without obstruction: Secondary | ICD-10-CM

## 2015-06-16 DIAGNOSIS — Z23 Encounter for immunization: Secondary | ICD-10-CM | POA: Diagnosis not present

## 2015-06-16 MED ORDER — BENZONATATE 200 MG PO CAPS: 200.0000 mg | ORAL_CAPSULE | Freq: Three times a day (TID) | ORAL | Status: DC | PRN

## 2015-06-16 MED ORDER — AZITHROMYCIN 250 MG PO TABS: ORAL_TABLET | ORAL | Status: DC

## 2015-06-16 MED ORDER — PREDNISONE 20 MG PO TABS: ORAL_TABLET | ORAL | Status: DC

## 2015-06-16 MED ORDER — HYDROCODONE-ACETAMINOPHEN 5-325 MG PO TABS: ORAL_TABLET | ORAL | Status: DC

## 2015-06-16 NOTE — Progress Notes (Signed)
Subjective:    Patient ID: Omar [name], male    DOB: December 03, 1955, 59 y.o.   MRN: [id number]  HPI Patient c/o 5-6 day prodrome of worsening S/T, head & chest congest and sharp burning CP w/ coughing, hoarseness and scant amts of brown, green & yellow sputum. Denies fevers, dyspnea or rash.   Medication Sig  . aspirin 81 MG tablet Take 81 mg by mouth daily.  Marland Kitchen VITAMIN D 5000 UNITS Take 1 capsule by mouth daily.  Marland Kitchen dicyclomine  20 MG tablet Take 1 tablet (20 mg total) by mouth 4 (four) times daily -  before meals and at bedtime.  Marland Kitchen esomeprazole  40 MG capsule Take 40 mg by mouth 2 (two) times daily.  Marland Kitchen ezetimibe-simvastatin  10-40 MG  Take 1 tablet by mouth at bedtime.  . finasteride  5 MG tablet Take 1 tablet (5 mg total) by mouth daily.  . fish oil-omega-3 1000 MG capsule Take 1 g by mouth 2 (two) times daily.   Marland Kitchen levothyroxine 150 MCG tablet Take 150 mcg by mouth daily.  Marland Kitchen CLARITIN-D 12-HR 5-120 MG t Take 1 tablet by mouth 2 (two) times daily.   . metFORMIN-XR 500 MG 24 hr tablet TAKE 1 TABLET TWICE DAILY WITH BREAKFAST AND LUNCH, AND 2 TABLETS WITH SUPPER  . MULTIVITAMIN WITH MINERALS Take 1 tablet by mouth daily.  . ranitidine (ZANTAC) 300 MG tablet Take 1 tablet 1 to 2 x day for acid indigestion & reflux  . tadalafil (CIALIS) 20 MG tablet 1/2 to 1 tablet every 2 to 3 days as needed for  XXXX  . testosterone cypionate  200 MG/ML inj Inject 2 mLs (400 mg total) into the muscle every 14 (fourteen) days.   No Known Allergies   Past Medical History  Diagnosis Date  . GERD (gastroesophageal reflux disease)   . Sleep apnea 2006    uses a cpap -desated to 55% during study  . Depression   . Complication of anesthesia     Pt states that he experienced some numbness on right side of tongue after surgery for 2 months; continues to experience gum sensitivity to date  . Headache(784.0)     Sinus headaches  . Tinnitus of both ears     Hx: of  . Hypertension   . Hyperlipidemia   .  Arthritis   . DJD (degenerative joint disease)   . Thyroid disease   . Hypothyroidism   . Prediabetes   . Vitamin D deficiency   . Other testicular hypofunction    Past Surgical History  Procedure Laterality Date  . Knee surgery    . Upper gi endoscopy      x2  . Colonoscopy      x2  . Mouth surgery    . Knee arthroscopy  05/12/2012    Procedure: ARTHROSCOPY KNEE;  Surgeon: Alta Corning, MD;  Location: [place];  Service: Orthopedics;  Laterality: Left;  . Cardiac catheterization  03/30/2010    normal coronaries, RCA takeoff of the left coronary cusp, EF 55% [facility])  . Total knee arthroplasty Left 10/16/2012    Dr Berenice Primas  . Knee arthroplasty Left 10/16/2012    Procedure: LEFT COMPUTER ASSISTED TOTAL KNEE ARTHROPLASTY;  Surgeon: Alta Corning, MD;  Location: [place];  Service: Orthopedics;  Laterality: Left;   Review of Systems  10 point systems review negative except as above.    Objective:   Physical Exam  BP 120/72 mmHg  Pulse 88  Temp(Src) 97.7 F (36.5 C)  Resp 16  Ht 5\' 7"  (1.702 m)  Wt 200 lb 9.6 oz (90.992 kg)  BMI 31.41 kg/m2  Skin - clear w/o rash, cyanosis, icterus. Hoarse voice w/o respiratory distress. (+) congested cough.  HEENT - Eac's patent. TM's Nl. EOM's full. PERRLA. NasoOroPharynx clear. (+) tender frontal/maxillary sinus areas.  Neck - supple. Nl Thyroid. Carotids 2+ & No bruits, nodes, JVD Chest - Scattered Rales and rhonchi and no  wheezes. Cor - Nl HS. RRR w/o sig MGR. PP 1(+). No edema. MS- FROM w/o deformities. Muscle power, tone and bulk Nl. Gait Nl. Neuro - No obvious Cr N abnormalities. Sensory, motor and Cerebellar functions appear Nl w/o focal abnormalities.    Assessment & Plan:   1. Tracheitis  - predniSONE (DELTASONE) 20 MG tablet; 1 tab 3 x day for 3 days, then 1 tab 2 x day for 3 days, then 1 tab 1 x day for 5 days  Dispense: 20 tablet; Refill: 0 - azithromycin (ZITHROMAX) 250 MG tablet; Take 2 tablets (500  mg) on  Day 1,  followed by 1 tablet (250 mg) once daily on Days 2 through 5.  Dispense: 6 each; Rf: 1 - HYDROcodone-acetaminophen (NORCO) 5-325 MG tablet; 1/2 to 1 tab every 3 to 4 hous if nrrded for cough or pain  Dispense: 50 tablet; Refill: 0 - benzonatate (TESSALON) 200 MG capsule; Take 1 capsule (200 mg total) by mouth 3 (three) times daily as needed for cough.  Dispense: 30 capsule; Refill: 1  2. Need for prophylactic vaccination and inoculation against influenza  - Flu vaccine greater than or equal to 3yo preservative free IM  - Discussed meds/SE's

## 2015-06-30 ENCOUNTER — Other Ambulatory Visit: Payer: Self-pay | Admitting: Internal Medicine

## 2015-06-30 DIAGNOSIS — B029 Zoster without complications: Secondary | ICD-10-CM

## 2015-06-30 MED ORDER — GABAPENTIN 300 MG PO CAPS: ORAL_CAPSULE | ORAL | Status: DC

## 2015-06-30 MED ORDER — VALACYCLOVIR HCL 1 G PO TABS: ORAL_TABLET | ORAL | Status: DC

## 2015-06-30 MED ORDER — PREDNISONE 20 MG PO TABS: ORAL_TABLET | ORAL | Status: DC

## 2015-07-04 ENCOUNTER — Telehealth: Payer: Self-pay | Admitting: *Deleted

## 2015-07-04 NOTE — Telephone Encounter (Signed)
Patient called and complained of ear ache,stuffy head and sinus drainage.  Per Dr Melford Aase, itis OK to take the Levaquin the patient has at home along with his current meds.

## 2015-07-14 ENCOUNTER — Other Ambulatory Visit: Payer: Self-pay | Admitting: *Deleted

## 2015-07-14 DIAGNOSIS — Z0001 Encounter for general adult medical examination with abnormal findings: Secondary | ICD-10-CM

## 2015-07-14 DIAGNOSIS — Z1212 Encounter for screening for malignant neoplasm of rectum: Secondary | ICD-10-CM

## 2015-07-14 LAB — POC HEMOCCULT BLD/STL (HOME/3-CARD/SCREEN)
Card #2 Fecal Occult Blod, POC: NEGATIVE
Card #3 Fecal Occult Blood, POC: NEGATIVE
Fecal Occult Blood, POC: NEGATIVE

## 2015-07-18 ENCOUNTER — Encounter: Payer: Self-pay | Admitting: Internal Medicine

## 2015-08-06 ENCOUNTER — Encounter: Payer: Self-pay | Admitting: Internal Medicine

## 2015-08-13 ENCOUNTER — Other Ambulatory Visit: Payer: Self-pay | Admitting: *Deleted

## 2015-08-13 MED ORDER — BUPROPION HCL ER (SR) 150 MG PO TB12: 150.0000 mg | ORAL_TABLET | Freq: Two times a day (BID) | ORAL | Status: DC

## 2015-08-20 ENCOUNTER — Encounter: Payer: Self-pay | Admitting: Internal Medicine

## 2015-08-20 ENCOUNTER — Ambulatory Visit (INDEPENDENT_AMBULATORY_CARE_PROVIDER_SITE_OTHER): Payer: BLUE CROSS/BLUE SHIELD | Admitting: Internal Medicine

## 2015-08-20 VITALS — BP 140/82 | HR 96 | Temp 97.7°F | Resp 16 | Ht 67.0 in | Wt 201.8 lb

## 2015-08-20 DIAGNOSIS — E1122 Type 2 diabetes mellitus with diabetic chronic kidney disease: Secondary | ICD-10-CM

## 2015-08-20 DIAGNOSIS — E785 Hyperlipidemia, unspecified: Secondary | ICD-10-CM | POA: Diagnosis not present

## 2015-08-20 DIAGNOSIS — Z79899 Other long term (current) drug therapy: Secondary | ICD-10-CM

## 2015-08-20 DIAGNOSIS — E291 Testicular hypofunction: Secondary | ICD-10-CM | POA: Diagnosis not present

## 2015-08-20 DIAGNOSIS — E079 Disorder of thyroid, unspecified: Secondary | ICD-10-CM | POA: Diagnosis not present

## 2015-08-20 DIAGNOSIS — G4733 Obstructive sleep apnea (adult) (pediatric): Secondary | ICD-10-CM | POA: Diagnosis not present

## 2015-08-20 DIAGNOSIS — Z9989 Dependence on other enabling machines and devices: Secondary | ICD-10-CM

## 2015-08-20 DIAGNOSIS — E349 Endocrine disorder, unspecified: Secondary | ICD-10-CM

## 2015-08-20 DIAGNOSIS — I10 Essential (primary) hypertension: Secondary | ICD-10-CM

## 2015-08-20 DIAGNOSIS — E559 Vitamin D deficiency, unspecified: Secondary | ICD-10-CM

## 2015-08-20 DIAGNOSIS — N182 Chronic kidney disease, stage 2 (mild): Secondary | ICD-10-CM | POA: Diagnosis not present

## 2015-08-20 LAB — CBC WITH DIFFERENTIAL/PLATELET
Basophils Absolute: 0.1 10*3/uL (ref 0.0–0.1)
Basophils Relative: 1 % (ref 0–1)
Eosinophils Absolute: 0.4 10*3/uL (ref 0.0–0.7)
Eosinophils Relative: 5 % (ref 0–5)
HCT: 47.4 % (ref 39.0–52.0)
Hemoglobin: 15.8 g/dL (ref 13.0–17.0)
Lymphocytes Relative: 43 % (ref 12–46)
Lymphs Abs: 3 10*3/uL (ref 0.7–4.0)
MCH: 29.2 pg (ref 26.0–34.0)
MCHC: 33.3 g/dL (ref 30.0–36.0)
MCV: 87.5 fL (ref 78.0–100.0)
MPV: 9.1 fL (ref 8.6–12.4)
Monocytes Absolute: 0.6 10*3/uL (ref 0.1–1.0)
Monocytes Relative: 9 % (ref 3–12)
Neutro Abs: 2.9 10*3/uL (ref 1.7–7.7)
Neutrophils Relative %: 42 % — ABNORMAL LOW (ref 43–77)
Platelets: 246 10*3/uL (ref 150–400)
RBC: 5.42 MIL/uL (ref 4.22–5.81)
RDW: 14.9 % (ref 11.5–15.5)
WBC: 7 10*3/uL (ref 4.0–10.5)

## 2015-08-20 LAB — HEMOGLOBIN A1C
Hgb A1c MFr Bld: 9.1 % — ABNORMAL HIGH (ref <5.7)
Mean Plasma Glucose: 214 mg/dL — ABNORMAL HIGH (ref <117)

## 2015-08-20 NOTE — Patient Instructions (Signed)

## 2015-08-21 ENCOUNTER — Encounter: Payer: Self-pay | Admitting: Internal Medicine

## 2015-08-21 LAB — VITAMIN D 25 HYDROXY (VIT D DEFICIENCY, FRACTURES): Vit D, 25-Hydroxy: 42 ng/mL (ref 30–100)

## 2015-08-21 LAB — BASIC METABOLIC PANEL WITH GFR
BUN: 11 mg/dL (ref 7–25)
CO2: 28 mmol/L (ref 20–31)
Calcium: 9 mg/dL (ref 8.6–10.3)
Chloride: 102 mmol/L (ref 98–110)
Creat: 1.44 mg/dL — ABNORMAL HIGH (ref 0.70–1.33)
GFR, Est African American: 61 mL/min (ref >=60)
GFR, Est Non African American: 53 mL/min — ABNORMAL LOW (ref >=60)
Glucose, Bld: 145 mg/dL — ABNORMAL HIGH (ref 65–99)
Potassium: 4.3 mmol/L (ref 3.5–5.3)
Sodium: 138 mmol/L (ref 135–146)

## 2015-08-21 LAB — HEPATIC FUNCTION PANEL
ALT: 22 U/L (ref 9–46)
AST: 17 U/L (ref 10–35)
Albumin: 4 g/dL (ref 3.6–5.1)
Alkaline Phosphatase: 47 U/L (ref 40–115)
Bilirubin, Direct: 0.1 mg/dL (ref <=0.2)
Indirect Bilirubin: 0.3 mg/dL (ref 0.2–1.2)
Total Bilirubin: 0.4 mg/dL (ref 0.2–1.2)
Total Protein: 6.4 g/dL (ref 6.1–8.1)

## 2015-08-21 LAB — TSH: TSH: 0.23 mIU/L — ABNORMAL LOW (ref 0.40–4.50)

## 2015-08-21 LAB — LIPID PANEL
Cholesterol: 228 mg/dL — ABNORMAL HIGH (ref 125–200)
HDL: 35 mg/dL — ABNORMAL LOW (ref >=40)
Total CHOL/HDL Ratio: 6.5 Ratio — ABNORMAL HIGH (ref <=5.0)
Triglycerides: 734 mg/dL — ABNORMAL HIGH (ref <150)

## 2015-08-21 LAB — MAGNESIUM: Magnesium: 1.8 mg/dL (ref 1.5–2.5)

## 2015-08-21 LAB — INSULIN, RANDOM: Insulin: 33.1 u[IU]/mL — ABNORMAL HIGH (ref 2.0–19.6)

## 2015-08-21 LAB — TESTOSTERONE: Testosterone: 797 ng/dL (ref 250–827)

## 2015-08-21 NOTE — Progress Notes (Signed)
Patient ID: Omar [name], male   DOB: 1956-05-19, 60 y.o.   MRN: [id number]   This very nice 60 y.o. MWM presents for  follow up with Hypertension, Hyperlipidemia, Pre-Diabetes, Testosterone  and Vitamin D Deficiency. Patient is s/p L TKR by Dr Berenice Primas  And now is c/o increasing pains in his R knee.   Patient is treated for HTN since 2008 & BP has been controlled at home. Today's BP: 140/82 mmHg.  In 2011 he had a false (+) nuclear stress cardiac test with a normal Heart Cath. Patient has had no complaints of any cardiac type chest pain, palpitations, dyspnea/orthopnea/PND, dizziness, claudication, or dependent edema.   Hyperlipidemia is controlled with diet & meds. Patient denies myalgias or other med SE's. Last Lipids were  Cholesterol 144; HDL 37*; LDL NOT CALC; and elevated Triglycerides 446 on 05/12/2015:   Also, the patient has history of Morbid Obesity (BMI 31+) and consequent T2_NIDDM with Tx started in 2014 and has had no symptoms of reactive hypoglycemia, diabetic polys, paresthesias or visual blurring.  Current A1c is not at goal due to admitted Gluttony with  A1c  9.1%.    Patient is on Testosterone replacement with improved stamina & sense of well being. Further, the patient also has history of Vitamin D Deficiency of "27" in 2008  and supplements vitamin D without any suspected side-effects. Last vitamin D was  52 on 05/12/2015.  Medication Sig  . aspirin 81 MG tablet Take 81 mg by mouth daily.  Marland Kitchen buPROPion  SR) 150 MG 12 hr tablet Take 1 tablet (150 mg total) by mouth 2 (two) times daily.  Marland Kitchen VITAMIN D 5000 UNITS CAPS Take 1 capsule by mouth daily.  Marland Kitchen esomeprazole  40 MG capsule Take 40 mg by mouth 2 (two) times daily.  Marland Kitchen VYTORIN 10-40 MG per tablet Take 1 tablet by mouth at bedtime.  . fish oil-omega-3  1000 MG  Take 1 g by mouth 2 (two) times daily.   Marland Kitchen levothyroxine  150 MCG tablet Take 150 mcg by mouth daily.  Marland Kitchen CLARITIN-D 5-120 MG  Take 1 tablet by mouth 2 (two) times daily.   .  metFORMIN -XR) 500 MG 24 hr tablet TAKE 1 TABLET TWICE DAILY WITH BREAKFAST AND LUNCH, AND 2 TABLETS WITH SUPPER  . MULTIVITAMIN WITH MINERALS Take 1 tablet by mouth daily.  . ranitidine (ZANTAC) 300 MG tablet Take 1 tablet 1 to 2 x day for acid indigestion & reflux  . tadalafil (CIALIS) 20 MG tablet 1/2 to 1 tablet every 2 to 3 days as needed for  XXXX  . testosterone cypionate  200 MG/ML inj Inject 2 mLs (400 mg total) into the muscle every 14 (fourteen) days.  Marland Kitchen dicyclomine (BENTYL) 20 MG tablet Take 1 tablet (20 mg total) by mouth 4 (four) times daily -  before meals and at bedtime.  . finasteride (PROSCAR) 5 MG tablet Take 1 tablet (5 mg total) by mouth daily.  . Gabapentin 300 MG capsule Take 1 capsule 3 to 4 x day as needed for Shingles pain   No Known Allergies  PMHx:   Past Medical History  Diagnosis Date  . GERD (gastroesophageal reflux disease)   . Sleep apnea 2006    uses a cpap -desated to 55% during study  . Depression   . Complication of anesthesia     Pt states that he experienced some numbness on right side of tongue after surgery for 2 months; continues to experience gum  sensitivity to date  . Headache(784.0)     Sinus headaches  . Tinnitus of both ears     Hx: of  . Hypertension   . Hyperlipidemia   . Arthritis   . DJD (degenerative joint disease)   . Thyroid disease   . Hypothyroidism   . Prediabetes   . Vitamin D deficiency   . Other testicular hypofunction    Immunization History  Administered Date(s) Administered  . Influenza Split 04/18/2013  . Influenza, Seasonal, Injecte, Preservative Fre 06/16/2015  . Pneumococcal Polysaccharide-23 04/12/2011  . Tdap 04/12/2011   Past Surgical History  Procedure Laterality Date  . Knee surgery    . Upper gi endoscopy      x2  . Colonoscopy      x2  . Mouth surgery    . Knee arthroscopy  05/12/2012    Procedure: ARTHROSCOPY KNEE;  Surgeon: Alta Corning, MD;  Location: [place];  Service:  Orthopedics;  Laterality: Left;  . Cardiac catheterization  03/30/2010    normal coronaries, RCA takeoff of the left coronary cusp, EF 55% [facility])  . Total knee arthroplasty Left 10/16/2012    Dr Berenice Primas  . Knee arthroplasty Left 10/16/2012    Procedure: LEFT COMPUTER ASSISTED TOTAL KNEE ARTHROPLASTY;  Surgeon: Alta Corning, MD;  Location: [place];  Service: Orthopedics;  Laterality: Left;   FHx:    Reviewed / unchanged  SHx:    Reviewed / unchanged  Systems Review:  Constitutional: Denies fever, chills, wt changes, headaches, insomnia, fatigue, night sweats, change in appetite. Eyes: Denies redness, blurred vision, diplopia, discharge, itchy, watery eyes.  ENT: Denies discharge, congestion, post nasal drip, epistaxis, sore throat, earache, hearing loss, dental pain, tinnitus, vertigo, sinus pain, snoring.  CV: Denies chest pain, palpitations, irregular heartbeat, syncope, dyspnea, diaphoresis, orthopnea, PND, claudication or edema. Respiratory: denies cough, dyspnea, DOE, pleurisy, hoarseness, laryngitis, wheezing.  Gastrointestinal: Denies dysphagia, odynophagia, heartburn, reflux, water brash, abdominal pain or cramps, nausea, vomiting, bloating, diarrhea, constipation, hematemesis, melena, hematochezia  or hemorrhoids. Genitourinary: Denies dysuria, frequency, urgency, nocturia, hesitancy, discharge, hematuria or flank pain. Musculoskeletal: Denies arthralgias, myalgias, stiffness, jt. swelling, pain, limping or strain/sprain.  Skin: Denies pruritus, rash, hives, warts, acne, eczema or change in skin lesion(s). Neuro: No weakness, tremor, incoordination, spasms, paresthesia or pain. Psychiatric: Denies confusion, memory loss or sensory loss. Endo: Denies change in weight, skin or hair change.  Heme/Lymph: No excessive bleeding, bruising or enlarged lymph nodes.  Physical Exam  BP 140/82 mmHg  Pulse 96  Temp(Src) 97.7 F (36.5 C)  Resp 16  Ht 5\' 7"  (1.702 m)  Wt 201 lb 12.8  oz (91.536 kg)  BMI 31.60 kg/m2  Appears over nourished and in no distress.  Eyes: PERRLA, EOMs, conjunctiva no swelling or erythema. Sinuses: No frontal/maxillary tenderness ENT/Mouth: EAC's clear, TM's nl w/o erythema, bulging. Nares clear w/o erythema, swelling, exudates. Oropharynx clear without erythema or exudates. Oral hygiene is good. Tongue normal, non obstructing. Hearing intact.  Neck: Supple. Thyroid nl. Car 2+/2+ without bruits, nodes or JVD. Chest: Respirations nl with BS clear & equal w/o rales, rhonchi, wheezing or stridor.  Cor: Heart sounds normal w/ regular rate and rhythm without sig. murmurs, gallops, clicks, or rubs. Peripheral pulses normal and equal  without edema.  Abdomen: Soft & bowel sounds normal. Non-tender w/o guarding, rebound, hernias, masses, or organomegaly.  Lymphatics: Unremarkable.  Musculoskeletal: Full ROM all peripheral extremities, joint stability, 5/5 strength, and normal gait.  Skin: Warm, dry  without exposed rashes, lesions or ecchymosis apparent.  Neuro: Cranial nerves intact, reflexes equal bilaterally. Sensory-motor testing grossly intact. Tendon reflexes grossly intact.  Pysch: Alert & oriented x 3.  Insight and judgement nl & appropriate. No ideations.  Assessment and Plan:  1. Essential hypertension  - TSH  2. Hyperlipidemia  - Lipid panel - TSH  3. T2_NIDDM w/CKD2  ([place])  - Hemoglobin A1c - Insulin, random  4. Vitamin D deficiency  - VITAMIN D 25 Hydroxy  5. Testosterone deficiency  - Testosterone  6. OSA on CPAP   7. Hypothyroidism  - TSH  8. Medication management  - CBC with Differential/Platelet - BASIC METABOLIC PANEL WITH GFR - Hepatic function panel - Magnesium     Recommended regular exercise, BP monitoring, weight control, and discussed med and SE's. Recommended labs to assess and monitor clinical status. Further disposition pending results of labs. Over 30 minutes of exam, counseling, chart review  was performed

## 2015-08-22 ENCOUNTER — Encounter: Payer: Self-pay | Admitting: Internal Medicine

## 2015-10-28 ENCOUNTER — Ambulatory Visit (INDEPENDENT_AMBULATORY_CARE_PROVIDER_SITE_OTHER): Payer: BLUE CROSS/BLUE SHIELD | Admitting: Internal Medicine

## 2015-10-28 ENCOUNTER — Encounter: Payer: Self-pay | Admitting: Internal Medicine

## 2015-10-28 VITALS — BP 130/84 | HR 96 | Temp 98.2°F | Resp 18 | Ht 66.75 in | Wt 197.0 lb

## 2015-10-28 DIAGNOSIS — J309 Allergic rhinitis, unspecified: Secondary | ICD-10-CM | POA: Diagnosis not present

## 2015-10-28 DIAGNOSIS — S0500XA Injury of conjunctiva and corneal abrasion without foreign body, unspecified eye, initial encounter: Secondary | ICD-10-CM | POA: Diagnosis not present

## 2015-10-28 MED ORDER — ERYTHROMYCIN 5 MG/GM OP OINT: TOPICAL_OINTMENT | Freq: Three times a day (TID) | OPHTHALMIC | Status: AC

## 2015-10-28 MED ORDER — FLUTICASONE PROPIONATE 50 MCG/ACT NA SUSP: 2.0000 | Freq: Every day | NASAL | Status: DC

## 2015-10-28 NOTE — Patient Instructions (Signed)

## 2015-10-28 NOTE — Progress Notes (Signed)
Subjective:    Patient ID: Omar [name], male    DOB: [birth year], 60 y.o.   MRN: [id number]  Eye Problem  The right eye is affected. This is a new problem. The current episode started yesterday. Associated symptoms include an eye discharge and eye redness. Pertinent negatives include no fever or photophobia.   Patient reports that he has been having some redness to the right eye which started yesterday.  No injury that he can think of.  He has had some crusting and itching to the bilateral eyes.  He does feels like there is something in the eye.  He feels like it is like a scratch.  He has no visual changes, light sensitivity that is out of the ordinary.  No contact lenses.  He does use glasses to read.  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  He did try visene which made it worse.  He has not tried anything else.  He does tend to get allergies every year.  He does take claritin D BID.     Review of Systems  Constitutional: Negative for fever, chills and fatigue.  HENT: Positive for congestion, postnasal drip and rhinorrhea. Negative for ear pain, nosebleeds and sneezing.   Eyes: Positive for discharge, redness and itching. Negative for photophobia, pain and visual disturbance.       Objective:   Physical Exam  Constitutional: He is oriented to person, place, and time. He appears well-developed and well-nourished. No distress.  HENT:  Head: Normocephalic.  Nose: Mucosal edema present. Right sinus exhibits no maxillary sinus tenderness and no frontal sinus tenderness. Left sinus exhibits no maxillary sinus tenderness and no frontal sinus tenderness.  Mouth/Throat: Uvula is midline, oropharynx is clear and moist and mucous membranes are normal. No trismus in the jaw. No oropharyngeal exudate.  Eyes: EOM are normal. Pupils are equal, round, and reactive to light. Right eye exhibits no chemosis, no discharge, no exudate and no hordeolum. No foreign body  present in the right eye. Left eye exhibits no chemosis, no discharge, no exudate and no hordeolum. No foreign body present in the left eye. Right conjunctiva is injected. Right conjunctiva has no hemorrhage. Left conjunctiva is not injected. Left conjunctiva has no hemorrhage. No scleral icterus.    Neck: Normal range of motion. Neck supple.  Cardiovascular: Normal rate, regular rhythm, normal heart sounds and intact distal pulses.  Exam reveals no gallop and no friction rub.   No murmur heard. Pulmonary/Chest: Effort normal and breath sounds normal. No respiratory distress. He has no wheezes. He has no rales. He exhibits no tenderness.  Musculoskeletal: Normal range of motion.  Neurological: He is alert and oriented to person, place, and time.  Skin: Skin is warm and dry. He is not diaphoretic.  Psychiatric: He has a normal mood and affect. His behavior is normal. Judgment and thought content normal.  Nursing note and vitals reviewed.         Assessment & Plan:    1. Corneal abrasion, unspecified laterality, initial encounter -Go to eye doctor if no improvement in 3-5 days or any vision changes.   - erythromycin [facility]) ophthalmic ointment; Place into the right eye 3 (three) times daily. Place a 1/2 inch ribbon of ointment into the lower eyelid.  Dispense: 3.5 g; Refill: 0  2. Allergic rhinitis, unspecified allergic rhinitis type  - fluticasone (FLONASE) 50 MCG/ACT nasal spray; Place 2 sprays into both nostrils daily.  Dispense:  16 g; Refill: 0

## 2015-11-18 ENCOUNTER — Ambulatory Visit: Payer: Self-pay | Admitting: Internal Medicine

## 2015-12-25 ENCOUNTER — Ambulatory Visit (INDEPENDENT_AMBULATORY_CARE_PROVIDER_SITE_OTHER): Payer: BLUE CROSS/BLUE SHIELD | Admitting: Internal Medicine

## 2015-12-25 ENCOUNTER — Encounter: Payer: Self-pay | Admitting: Internal Medicine

## 2015-12-25 VITALS — BP 124/80 | HR 88 | Temp 97.9°F | Resp 16 | Ht 67.0 in | Wt 196.2 lb

## 2015-12-25 DIAGNOSIS — K529 Noninfective gastroenteritis and colitis, unspecified: Secondary | ICD-10-CM

## 2015-12-25 DIAGNOSIS — A09 Infectious gastroenteritis and colitis, unspecified: Secondary | ICD-10-CM | POA: Diagnosis not present

## 2015-12-25 MED ORDER — METRONIDAZOLE 500 MG PO TABS: 500.0000 mg | ORAL_TABLET | Freq: Three times a day (TID) | ORAL | Status: DC

## 2015-12-25 MED ORDER — CIPROFLOXACIN HCL 500 MG PO TABS: 500.0000 mg | ORAL_TABLET | Freq: Two times a day (BID) | ORAL | Status: DC

## 2015-12-26 ENCOUNTER — Other Ambulatory Visit: Payer: Self-pay | Admitting: Internal Medicine

## 2015-12-26 ENCOUNTER — Encounter: Payer: Self-pay | Admitting: Internal Medicine

## 2015-12-26 DIAGNOSIS — M5430 Sciatica, unspecified side: Secondary | ICD-10-CM

## 2015-12-26 DIAGNOSIS — B360 Pityriasis versicolor: Secondary | ICD-10-CM

## 2015-12-26 MED ORDER — GABAPENTIN 300 MG PO CAPS: ORAL_CAPSULE | ORAL | Status: DC

## 2015-12-26 MED ORDER — KETOCONAZOLE 2 % EX CREA: 1.0000 "application " | TOPICAL_CREAM | Freq: Every day | CUTANEOUS | Status: DC

## 2015-12-26 NOTE — Progress Notes (Signed)
Subjective:    Patient ID: Omar [name], male    DOB: 1956/01/13, 60 y.o.   MRN: [id number]  HPI  Patient presents with c/o 3 week hx/o intermittent watery diarrhea, occas formed, but soft w/o blood or mucus evident and denies any fever, chills, sweats, rashes,  N/V. He relates the diarrhea started after a trip to [facility] on [place] day.   Medication Sig  . aspirin 81 MG tablet Take 81 mg by mouth daily.  Marland Kitchen buPROPion  SR 150 MG 12 hr Take 1 tablet (150 mg total) by mouth 2 (two) times daily.  Marland Kitchen VITAMIN D 5000 UNITS  Take 1 capsule by mouth daily.  Marland Kitchen esomeprazole  40 MG  Take 40 mg by mouth 2 (two) times daily.  Marland Kitchen ezetimibe-simvastatin  10-40 MG  Take 1 tablet by mouth at bedtime.  . fish oil-omega-3  1000 MG  Take 1 g by mouth 2 (two) times daily.   Marland Kitchen FLONASE  nasal spray Place 2 sprays into both nostrils daily.  Marland Kitchen levothyroxine  150 MCG  Take 150 mcg by mouth daily.  Marland Kitchen CLARITIN-D 12-HR 5-120 MG  Take 1 tablet by mouth 2 (two) times daily.   . metFORMIN-XR 500 MG  Take 1 tab  bid  Bkfst / Lunch &  2 Tab w/ SUPPER  . MultiVit  w/ MIN Take 1 tablet by mouth daily.  . tadalafil  20 MG tablet 1/2 to 1 tablet every 2 to 3 days as needed for  XXXX  . testosterone cypio 200 MG/ML inj Inject 2 mLs (400 mg total) into the muscle every 14 (fourteen) days.  . ranitidine  300 MG  Take 1 tablet 1 to 2 x day for acid indigestion & reflux   No Known Allergies   Past Medical History  Diagnosis Date  . GERD (gastroesophageal reflux disease)   . Sleep apnea 2006    uses a cpap -desated to 55% during study  . Depression   . Complication of anesthesia     Pt states that he experienced some numbness on right side of tongue after surgery for 2 months; continues to experience gum sensitivity to date  . Headache(784.0)     Sinus headaches  . Tinnitus of both ears     Hx: of  . Hypertension   . Hyperlipidemia   . Arthritis   . DJD (degenerative joint disease)   . Thyroid disease   .  Hypothyroidism   . Prediabetes   . Vitamin D deficiency   . Other testicular hypofunction    Past Surgical History  Procedure Laterality Date  . Knee surgery    . Upper gi endoscopy      x2  . Colonoscopy      x2  . Mouth surgery    . Knee arthroscopy  05/12/2012    Procedure: ARTHROSCOPY KNEE;   Alta Corning, MD  . Cardiac catheterization  03/30/2010    normal coronaries, RCA takeoff of the left coronary cusp, EF 55% [facility])  . Total knee arthroplasty Left 10/16/2012    Dr Berenice Primas  . Knee arthroplasty Left 10/16/2012    Procedure: LEFT COMPUTER ASSISTED TOTAL KNEE ARTHROPLASTY;  Alta Corning, MD   Review of Systems  10 point systems review negative except as above.    Objective:   Physical Exam  BP 124/80 mmHg  Pulse 88  Temp(Src) 97.9 F (36.6 C)  Resp 16  Ht 5\' 7"  (1.702 m)  Wt 196 lb 3.2 oz (88.996 kg)  BMI 30.72 kg/m2  HEENT - Eac's patent. TM's Nl. EOM's full. PERRLA. NasoOroPharynx clear. Neck - supple. Nl Thyroid. Carotids 2+ & No bruits, nodes, JVD Chest - Clear equal BS w/o Rales, rhonchi, wheezes. Cor - Nl HS. RRR w/o sig MGR. PP 1(+). No edema. Abd - No palpable organomegaly, masses or tenderness. BS nl. MS- FROM w/o deformities. Muscle power, tone and bulk Nl. Gait Nl. Neuro - No obvious Cr N abnormalities. Sensory, motor and Cerebellar functions appear Nl w/o focal abnormalities. Skin - exposed w/o rash, cyanosis or icterus.     Assessment & Plan:   1. Gastroenteritis presumed infectious  - ciprofloxacin (CIPRO) 500 MG tablet; Take 1 tablet (500 mg total) by mouth 2 (two) times daily.  Dispense: 14 tablet; Refill: 1 - metroNIDAZOLE (FLAGYL) 500 MG tablet; Take 1 tablet (500 mg total) by mouth 3 (three) times daily.  Dispense: 21 tablet; Refill: 1 - diet discussed  - also patient c/o sciatica & was given Rx for Gabapentin

## 2016-01-07 ENCOUNTER — Other Ambulatory Visit: Payer: Self-pay | Admitting: *Deleted

## 2016-01-07 DIAGNOSIS — E349 Endocrine disorder, unspecified: Secondary | ICD-10-CM

## 2016-01-07 MED ORDER — TADALAFIL 20 MG PO TABS: ORAL_TABLET | ORAL | Status: DC

## 2016-01-09 ENCOUNTER — Encounter (HOSPITAL_BASED_OUTPATIENT_CLINIC_OR_DEPARTMENT_OTHER): Payer: Self-pay | Admitting: *Deleted

## 2016-01-09 ENCOUNTER — Other Ambulatory Visit: Payer: Self-pay | Admitting: Orthopedic Surgery

## 2016-01-12 ENCOUNTER — Observation Stay (HOSPITAL_BASED_OUTPATIENT_CLINIC_OR_DEPARTMENT_OTHER)
Admission: RE | Admit: 2016-01-12 | Discharge: 2016-01-12 | Disposition: A | Discharge status: Home / Self Care | Payer: BLUE CROSS/BLUE SHIELD | Source: Ambulatory Visit | Attending: Orthopedic Surgery | Admitting: Orthopedic Surgery

## 2016-01-12 ENCOUNTER — Encounter (HOSPITAL_BASED_OUTPATIENT_CLINIC_OR_DEPARTMENT_OTHER): Payer: Self-pay | Admitting: *Deleted

## 2016-01-12 ENCOUNTER — Observation Stay (HOSPITAL_BASED_OUTPATIENT_CLINIC_OR_DEPARTMENT_OTHER): Payer: BLUE CROSS/BLUE SHIELD | Admitting: Certified Registered"

## 2016-01-12 ENCOUNTER — Encounter (HOSPITAL_BASED_OUTPATIENT_CLINIC_OR_DEPARTMENT_OTHER)
Admission: RE | Disposition: A | Discharge status: Home / Self Care | Payer: Self-pay | Source: Ambulatory Visit | Attending: Orthopedic Surgery

## 2016-01-12 DIAGNOSIS — E559 Vitamin D deficiency, unspecified: Secondary | ICD-10-CM | POA: Insufficient documentation

## 2016-01-12 DIAGNOSIS — Z79899 Other long term (current) drug therapy: Secondary | ICD-10-CM | POA: Diagnosis not present

## 2016-01-12 DIAGNOSIS — K219 Gastro-esophageal reflux disease without esophagitis: Secondary | ICD-10-CM | POA: Diagnosis not present

## 2016-01-12 DIAGNOSIS — I10 Essential (primary) hypertension: Secondary | ICD-10-CM | POA: Insufficient documentation

## 2016-01-12 DIAGNOSIS — Z7982 Long term (current) use of aspirin: Secondary | ICD-10-CM | POA: Diagnosis not present

## 2016-01-12 DIAGNOSIS — H9313 Tinnitus, bilateral: Secondary | ICD-10-CM | POA: Insufficient documentation

## 2016-01-12 DIAGNOSIS — Z96652 Presence of left artificial knee joint: Secondary | ICD-10-CM | POA: Diagnosis not present

## 2016-01-12 DIAGNOSIS — Z87891 Personal history of nicotine dependence: Secondary | ICD-10-CM | POA: Diagnosis not present

## 2016-01-12 DIAGNOSIS — G5601 Carpal tunnel syndrome, right upper limb: Principal | ICD-10-CM | POA: Insufficient documentation

## 2016-01-12 DIAGNOSIS — E785 Hyperlipidemia, unspecified: Secondary | ICD-10-CM | POA: Insufficient documentation

## 2016-01-12 DIAGNOSIS — E039 Hypothyroidism, unspecified: Secondary | ICD-10-CM | POA: Insufficient documentation

## 2016-01-12 DIAGNOSIS — Z800 Family history of malignant neoplasm of digestive organs: Secondary | ICD-10-CM | POA: Insufficient documentation

## 2016-01-12 DIAGNOSIS — E119 Type 2 diabetes mellitus without complications: Secondary | ICD-10-CM | POA: Diagnosis not present

## 2016-01-12 DIAGNOSIS — M1990 Unspecified osteoarthritis, unspecified site: Secondary | ICD-10-CM | POA: Insufficient documentation

## 2016-01-12 DIAGNOSIS — Z803 Family history of malignant neoplasm of breast: Secondary | ICD-10-CM | POA: Diagnosis not present

## 2016-01-12 DIAGNOSIS — G4730 Sleep apnea, unspecified: Secondary | ICD-10-CM | POA: Insufficient documentation

## 2016-01-12 DIAGNOSIS — F329 Major depressive disorder, single episode, unspecified: Secondary | ICD-10-CM | POA: Diagnosis not present

## 2016-01-12 DIAGNOSIS — Z7984 Long term (current) use of oral hypoglycemic drugs: Secondary | ICD-10-CM | POA: Insufficient documentation

## 2016-01-12 HISTORY — PX: CARPAL TUNNEL RELEASE: SHX101

## 2016-01-12 HISTORY — DX: Type 2 diabetes mellitus without complications: E11.9

## 2016-01-12 LAB — GLUCOSE, CAPILLARY
Glucose-Capillary: 133 mg/dL — ABNORMAL HIGH (ref 65–99)
Glucose-Capillary: 155 mg/dL — ABNORMAL HIGH (ref 65–99)

## 2016-01-12 SURGERY — CARPAL TUNNEL RELEASE
Anesthesia: General | Site: Wrist | Laterality: Right

## 2016-01-12 MED ORDER — HYDROCODONE-ACETAMINOPHEN 5-325 MG PO TABS: 1.0000 | ORAL_TABLET | Freq: Four times a day (QID) | ORAL | Status: DC | PRN

## 2016-01-12 MED ORDER — SCOPOLAMINE 1 MG/3DAYS TD PT72: 1.0000 | MEDICATED_PATCH | Freq: Once | TRANSDERMAL | Status: DC | PRN

## 2016-01-12 MED ORDER — BUPIVACAINE HCL (PF) 0.5 % IJ SOLN
INTRAMUSCULAR | Status: AC
  Filled 2016-01-12: qty 30

## 2016-01-12 MED ORDER — FENTANYL CITRATE (PF) 100 MCG/2ML IJ SOLN
50.0000 ug | INTRAMUSCULAR | Status: AC | PRN
  Administered 2016-01-12: 100 ug via INTRAVENOUS
  Administered 2016-01-12: 50 ug via INTRAVENOUS
  Administered 2016-01-12 (×2): 25 ug via INTRAVENOUS

## 2016-01-12 MED ORDER — MIDAZOLAM HCL 2 MG/2ML IJ SOLN
1.0000 mg | INTRAMUSCULAR | Status: DC | PRN
  Administered 2016-01-12: 2 mg via INTRAVENOUS

## 2016-01-12 MED ORDER — CEFAZOLIN SODIUM-DEXTROSE 2-4 GM/100ML-% IV SOLN
INTRAVENOUS | Status: AC
  Filled 2016-01-12: qty 100

## 2016-01-12 MED ORDER — PROMETHAZINE HCL 25 MG/ML IJ SOLN: 6.2500 mg | INTRAMUSCULAR | Status: DC | PRN

## 2016-01-12 MED ORDER — DEXAMETHASONE SODIUM PHOSPHATE 10 MG/ML IJ SOLN
INTRAMUSCULAR | Status: DC | PRN
  Administered 2016-01-12: 10 mg via INTRAVENOUS

## 2016-01-12 MED ORDER — BUPIVACAINE HCL (PF) 0.5 % IJ SOLN
INTRAMUSCULAR | Status: DC | PRN
  Administered 2016-01-12: 6 mL

## 2016-01-12 MED ORDER — 0.9 % SODIUM CHLORIDE (POUR BTL) OPTIME
TOPICAL | Status: DC | PRN
  Administered 2016-01-12: 200 mL

## 2016-01-12 MED ORDER — ONDANSETRON HCL 4 MG/2ML IJ SOLN
INTRAMUSCULAR | Status: DC | PRN
  Administered 2016-01-12: 4 mg via INTRAVENOUS

## 2016-01-12 MED ORDER — HYDROCODONE-ACETAMINOPHEN 7.5-325 MG PO TABS: 1.0000 | ORAL_TABLET | Freq: Once | ORAL | Status: DC | PRN

## 2016-01-12 MED ORDER — GLYCOPYRROLATE 0.2 MG/ML IJ SOLN: 0.2000 mg | Freq: Once | INTRAMUSCULAR | Status: DC | PRN

## 2016-01-12 MED ORDER — POVIDONE-IODINE 7.5 % EX SOLN: Freq: Once | CUTANEOUS | Status: DC

## 2016-01-12 MED ORDER — PROPOFOL 10 MG/ML IV BOLUS
INTRAVENOUS | Status: DC | PRN
  Administered 2016-01-12: 50 mg via INTRAVENOUS
  Administered 2016-01-12: 200 mg via INTRAVENOUS

## 2016-01-12 MED ORDER — CEFAZOLIN SODIUM-DEXTROSE 2-4 GM/100ML-% IV SOLN
2.0000 g | INTRAVENOUS | Status: AC
  Administered 2016-01-12: 2 g via INTRAVENOUS

## 2016-01-12 MED ORDER — FENTANYL CITRATE (PF) 100 MCG/2ML IJ SOLN: 25.0000 ug | INTRAMUSCULAR | Status: DC | PRN

## 2016-01-12 MED ORDER — FENTANYL CITRATE (PF) 100 MCG/2ML IJ SOLN
INTRAMUSCULAR | Status: AC
  Filled 2016-01-12: qty 2

## 2016-01-12 MED ORDER — LACTATED RINGERS IV SOLN
INTRAVENOUS | Status: DC
  Administered 2016-01-12: 14:00:00 via INTRAVENOUS
  Administered 2016-01-12: 10 mL/h via INTRAVENOUS

## 2016-01-12 MED ORDER — LIDOCAINE HCL (CARDIAC) 20 MG/ML IV SOLN
INTRAVENOUS | Status: DC | PRN
  Administered 2016-01-12: 100 mg via INTRAVENOUS

## 2016-01-12 MED ORDER — MIDAZOLAM HCL 2 MG/2ML IJ SOLN
INTRAMUSCULAR | Status: AC
  Filled 2016-01-12: qty 2

## 2016-01-12 SURGICAL SUPPLY — 37 items
BANDAGE COBAN STERILE 2 (GAUZE/BANDAGES/DRESSINGS) ×2 IMPLANT
BLADE SURG 15 STRL LF DISP TIS (BLADE) ×1 IMPLANT
BLADE SURG 15 STRL SS (BLADE) ×2
BNDG CONFORM 2 STRL LF (GAUZE/BANDAGES/DRESSINGS) ×2 IMPLANT
BNDG ESMARK 4X9 LF (GAUZE/BANDAGES/DRESSINGS) ×2 IMPLANT
CHLORAPREP W/TINT 26ML (MISCELLANEOUS) ×2 IMPLANT
CORDS BIPOLAR (ELECTRODE) ×2 IMPLANT
COVER BACK TABLE 60X90IN (DRAPES) ×2 IMPLANT
DECANTER SPIKE VIAL GLASS SM (MISCELLANEOUS) IMPLANT
DRAPE EXTREMITY T 121X128X90 (DRAPE) ×2 IMPLANT
DRAPE IMP U-DRAPE 54X76 (DRAPES) ×2 IMPLANT
DRAPE SURG 17X23 STRL (DRAPES) ×2 IMPLANT
DRSG EMULSION OIL 3X3 NADH (GAUZE/BANDAGES/DRESSINGS) ×2 IMPLANT
GAUZE SPONGE 4X4 12PLY STRL (GAUZE/BANDAGES/DRESSINGS) ×2 IMPLANT
GLOVE BIO SURGEON STRL SZ7 (GLOVE) ×2 IMPLANT
GLOVE BIO SURGEON STRL SZ7.5 (GLOVE) ×2 IMPLANT
GLOVE BIOGEL M STRL SZ7.5 (GLOVE) ×2 IMPLANT
GLOVE BIOGEL PI IND STRL 7.0 (GLOVE) ×1 IMPLANT
GLOVE BIOGEL PI IND STRL 8 (GLOVE) ×2 IMPLANT
GLOVE BIOGEL PI INDICATOR 7.0 (GLOVE) ×1
GLOVE BIOGEL PI INDICATOR 8 (GLOVE) ×2
GOWN STRL REUS W/ TWL LRG LVL3 (GOWN DISPOSABLE) ×1 IMPLANT
GOWN STRL REUS W/ TWL XL LVL3 (GOWN DISPOSABLE) ×2 IMPLANT
GOWN STRL REUS W/TWL LRG LVL3 (GOWN DISPOSABLE) ×1
GOWN STRL REUS W/TWL XL LVL3 (GOWN DISPOSABLE) ×4
NEEDLE HYPO 25X1 1.5 SAFETY (NEEDLE) ×2 IMPLANT
NS IRRIG 1000ML POUR BTL (IV SOLUTION) ×2 IMPLANT
PACK BASIN DAY SURGERY FS (CUSTOM PROCEDURE TRAY) ×2 IMPLANT
PADDING CAST ABS 4INX4YD NS (CAST SUPPLIES)
PADDING CAST ABS COTTON 4X4 ST (CAST SUPPLIES) IMPLANT
STOCKINETTE 4X48 STRL (DRAPES) ×2 IMPLANT
SUT ETHILON 4 0 PS 2 18 (SUTURE) ×2 IMPLANT
SYR BULB 3OZ (MISCELLANEOUS) ×2 IMPLANT
SYR CONTROL 10ML LL (SYRINGE) IMPLANT
TOWEL OR 17X24 6PK STRL BLUE (TOWEL DISPOSABLE) ×2 IMPLANT
TOWEL OR NON WOVEN STRL DISP B (DISPOSABLE) ×2 IMPLANT
UNDERPAD 30X30 (UNDERPADS AND DIAPERS) ×2 IMPLANT

## 2016-01-12 NOTE — Anesthesia Preprocedure Evaluation (Addendum)
Anesthesia Evaluation  Patient identified by MRN, date of birth, ID band Patient awake    Reviewed: Allergy & Precautions, H&P , NPO status , Patient's Chart, lab work & pertinent test results  History of Anesthesia Complications Negative for: history of anesthetic complications  Airway Mallampati: II  TM Distance: >3 FB Neck ROM: full    Dental no notable dental hx.    Pulmonary sleep apnea and Continuous Positive Airway Pressure Ventilation , former smoker,    Pulmonary exam normal breath sounds clear to auscultation       Cardiovascular hypertension, Pt. on medications Normal cardiovascular exam Rhythm:regular Rate:Normal     Neuro/Psych  Headaches, PSYCHIATRIC DISORDERS Depression    GI/Hepatic Neg liver ROS, GERD  ,  Endo/Other  negative endocrine ROSdiabetesHypothyroidism   Renal/GU negative Renal ROS     Musculoskeletal  (+) Arthritis ,   Abdominal   Peds  Hematology negative hematology ROS (+)   Anesthesia Other Findings   Reproductive/Obstetrics negative OB ROS                            Anesthesia Physical Anesthesia Plan  ASA: III  Anesthesia Plan: General   Post-op Pain Management:    Induction: Intravenous  Airway Management Planned: LMA  Additional Equipment:   Intra-op Plan:   Post-operative Plan:   Informed Consent: I have reviewed the patients History and Physical, chart, labs and discussed the procedure including the risks, benefits and alternatives for the proposed anesthesia with the patient or authorized representative who has indicated his/her understanding and acceptance.   Dental Advisory Given  Plan Discussed with: Anesthesiologist, CRNA and Surgeon  Anesthesia Plan Comments: (Will place LMA and undergo GA per patient preference)       Anesthesia Quick Evaluation

## 2016-01-12 NOTE — H&P (Signed)
Omar [name] is an 60 y.o. male.   Chief Complaint: Right hand numbness and pain HPI: Right hand carpal tunnel syndrome which has been long-standing and severe and is failed other conservative management.  Past Medical History  Diagnosis Date  . GERD (gastroesophageal reflux disease)   . Depression   . Headache(784.0)     Sinus headaches  . Tinnitus of both ears     Hx: of  . Hypertension   . Hyperlipidemia   . Arthritis   . DJD (degenerative joint disease)   . Thyroid disease   . Hypothyroidism   . Prediabetes   . Vitamin D deficiency   . Other testicular hypofunction   . Sleep apnea 2006    uses a cpap -desated to 55% during study, uses CPAP nightly  . Diabetes mellitus without complication ([place])   . Complication of anesthesia     Pt states that he experienced some numbness on right side of tongue after surgery for 2 months; continues to experience gum sensitivity to date    Past Surgical History  Procedure Laterality Date  . Knee surgery    . Upper gi endoscopy      x2  . Colonoscopy      x2  . Mouth surgery    . Knee arthroscopy  05/12/2012    Procedure: ARTHROSCOPY KNEE;  Surgeon: Alta Corning, MD;  Location: [place];  Service: Orthopedics;  Laterality: Left;  . Cardiac catheterization  03/30/2010    normal coronaries, RCA takeoff of the left coronary cusp, EF 55% [facility])  . Total knee arthroplasty Left 10/16/2012    Dr Berenice Primas  . Knee arthroplasty Left 10/16/2012    Procedure: LEFT COMPUTER ASSISTED TOTAL KNEE ARTHROPLASTY;  Surgeon: Alta Corning, MD;  Location: [place];  Service: Orthopedics;  Laterality: Left;    Family History  Problem Relation Age of Onset  . Cancer Mother     breast  . Hyperlipidemia Mother   . Hypertension Mother   . Cancer Father     pancreatic   Social History:  reports that he quit smoking about 8 months ago. His smoking use included Cigarettes. He has a 40 pack-year smoking history. He has never used  smokeless tobacco. He reports that he does not drink alcohol or use illicit drugs.  Allergies: No Known Allergies  Medications Prior to Admission  Medication Sig Dispense Refill  . aspirin 81 MG tablet Take 81 mg by mouth daily.    Marland Kitchen buPROPion (WELLBUTRIN SR) 150 MG 12 hr tablet Take 1 tablet (150 mg total) by mouth 2 (two) times daily. 180 tablet 1  . Cholecalciferol (VITAMIN D3) 5000 UNITS CAPS Take 1 capsule by mouth daily.    Marland Kitchen esomeprazole (NEXIUM) 40 MG capsule Take 40 mg by mouth 2 (two) times daily.    Marland Kitchen ezetimibe-simvastatin (VYTORIN) 10-40 MG per tablet Take 1 tablet by mouth at bedtime.    . fish oil-omega-3 fatty acids 1000 MG capsule Take 1 g by mouth 2 (two) times daily.     . fluticasone (FLONASE) 50 MCG/ACT nasal spray Place 2 sprays into both nostrils daily. 16 g 0  . gabapentin (NEURONTIN) 300 MG capsule Take 1 capsule 3 x / day for Sciatica Neuritis pain 90 capsule 0  . ketoconazole (NIZORAL) 2 % cream Apply 1 application topically daily. Apply 2 x / day to tinea versicolor rash 60 g 3  . levothyroxine (SYNTHROID, LEVOTHROID) 150 MCG tablet Take 150 mcg  by mouth daily.    Marland Kitchen loratadine-pseudoephedrine (CLARITIN-D 12-HOUR) 5-120 MG per tablet Take 1 tablet by mouth 2 (two) times daily.     . metFORMIN (GLUCOPHAGE-XR) 500 MG 24 hr tablet TAKE 1 TABLET TWICE DAILY WITH BREAKFAST AND LUNCH, AND 2 TABLETS WITH SUPPER 60 tablet 11  . Multiple Vitamin (MULTIVITAMIN WITH MINERALS) TABS Take 1 tablet by mouth daily.    . tadalafil (CIALIS) 20 MG tablet 1 tablet every day 100 tablet 3  . testosterone cypionate (DEPO-TESTOSTERONE) 200 MG/ML injection Inject 2 mLs (400 mg total) into the muscle every 14 (fourteen) days. 10 mL 5    No results found for this or any previous visit (from the past 48 hour(s)). No results found.  Review of Systems  All other systems reviewed and are negative.   Blood pressure 140/84, pulse 72, temperature 98 F (36.7 C), temperature source Oral, resp.  rate 18, height 5\' 7"  (1.702 m), weight 87.998 kg (194 lb), SpO2 97 %. Physical Exam  Constitutional: He is oriented to person, place, and time. He appears well-developed and well-nourished.  HENT:  Head: Atraumatic.  Eyes: EOM are normal.  Cardiovascular: Intact distal pulses.   Respiratory: Effort normal.  Musculoskeletal:  Right hand has decreased sensation to light touch in median nerve distribution. No muscular atrophy.  Neurological: He is alert and oriented to person, place, and time.  Skin: Skin is warm and dry.  Psychiatric: He has a normal mood and affect.     Assessment/Plan Right carpal tunnel syndrome Plan right carpal tunnel release Risks / benefits of surgery discussed Consent on chart  NPO for OR Preop antibiotics   Nita Sells, MD 01/12/2016, 12:54 PM

## 2016-01-12 NOTE — Op Note (Signed)
Procedure(s): CARPAL TUNNEL RELEASE Procedure Note  NERICK HILLSON male 60 y.o. 01/12/2016  Procedure(s) and Anesthesia Type:    * RIGHT CARPAL TUNNEL RELEASE - General  Surgeon(s) and Role:    * Tania Ade, MD - Primary   Indications:  60 y.o. male with right carpal tunnel syndrome. The patient has findings of severe carpal tunnel syndrome and has failed nonoperative treatment.     Surgeon: Nita Sells   Assistants: Jeanmarie Hubert PA-C [facility] was present and scrubbed throughout the procedure and was essential in positioning, retraction, exposure, and closure)  Anesthesia: General LMA anesthesia     Procedure Detail  CARPAL TUNNEL RELEASE  Findings: The transcarpal he is severely thickened and tight. After releasing it completely nerve was found to be intact. The edges of the ligament remained widely patent.  Estimated Blood Loss:  Minimal         Drains: none  Blood Given: none         Specimens: none        Complications:  * No complications entered in OR log *  Tourniquet time: Approximately 15 min at 250 mmHg         Disposition: PACU - hemodynamically stable.         Condition: stable    Procedure: The patient was brought to the operating room and was placed supine on the operative table.  LMA anesthesia was used.  A nonsterile tourniquet was applied and the operative extremity was prepped and draped in the standard sterile fashion.  The limb was exsanguinated using an Esmarch dressing and the tourniquet was elevated to 250 mm mercury.  A 3 cm incision was made in line with the web space between the third and fourth ray extending distally from the flexion crease of the wrist. Dissection was carried down through subcutaneous fat to the palmar fascia which was opened longitudinally in line with the incision. Underlying muscle was swept off the transcarpal ligament and a small rent was made in the ligament with a 15 blade. A Freer  elevator was then inserted proximally and distally to protect the contents of the carpal canal while a 15 blade was used under direct visualization to open proximally and distally. The proximal and distal extents of the transcarpal ligament were then carefully exposed and under direct visualization completely divided using tenotomy scissors. Great care was taken to protect the underlying nerve and distally the palmar arch. The transcarpal ligament was noted to be severely thickened centrally.  At the conclusion of the procedure the free edges of the transcarpal ligament or widely separated.  The wound was copiously irrigated with normal saline and subsequently closed in one layer with 4-0 nylon in an interrupted fashion.  Sterile dressings were applied including Adaptic 4 x 4's Kling dressing and a lightly wrapped Coban dressing. The tourniquet was let down. The fingers were pink and warm.  The patient was then awakened transferred to a stretcher and taken to the recovery room in stable condition.  Postoperative plan: Patient will be discharged home today and will followup in 10 days for suture removal and wound check.

## 2016-01-12 NOTE — Discharge Instructions (Signed)
Discharge Instructions after Carpal Tunnel Release ° ° °You will have a light dressing on your hand.  °You may begin gentle motion of your fingers and hand immediately, but you should not do any heavy lifting or gripping.  °Elevate your hand for the first 48 hours after surgery. °Pain medicine has been prescribed for you.  °Use your medicine as needed over the first 48 hours, and then you can begin to taper your use. You may take Extra Strength Tylenol or Tylenol only in place of the pain pills.  °Leave the dressing in place until the third day after your surgery and then remove it and place a band-aid over the stitches.  °After the bandage has been removed you may shower, but do not soak the incision.  °You may drive a car when you are off of prescription pain medications and can safely control your vehicle with both hands. ° ° °Please call [phone] during normal business hours or [phone] after hours for any problems. Including the following: ° °- excessive redness of the incisions °- drainage for more than 4 days °- fever of more than 101.5 F ° °*Please note that pain medications will not be refilled after hours or on weekends. ° °Post Anesthesia Home Care Instructions ° °Activity: °Get plenty of rest for the remainder of the day. A responsible adult should stay with you for 24 hours following the procedure.  °For the next 24 hours, DO NOT: °-Drive a car °-Operate machinery °-Drink alcoholic beverages °-Take any medication unless instructed by your physician °-Make any legal decisions or sign important papers. ° °Meals: °Start with liquid foods such as gelatin or soup. Progress to regular foods as tolerated. Avoid greasy, spicy, heavy foods. If nausea and/or vomiting occur, drink only clear liquids until the nausea and/or vomiting subsides. Call your physician if vomiting continues. ° °Special Instructions/Symptoms: °Your throat may feel dry or sore from the anesthesia or the breathing tube placed in your  throat during surgery. If this causes discomfort, gargle with warm salt water. The discomfort should disappear within 24 hours. ° °If you had a scopolamine patch placed behind your ear for the management of post- operative nausea and/or vomiting: ° °1. The medication in the patch is effective for 72 hours, after which it should be removed.  Wrap patch in a tissue and discard in the trash. Wash hands thoroughly with soap and water. °2. You may remove the patch earlier than 72 hours if you experience unpleasant side effects which may include dry mouth, dizziness or visual disturbances. °3. Avoid touching the patch. Wash your hands with soap and water after contact with the patch. °  ° °

## 2016-01-12 NOTE — Transfer of Care (Signed)
Immediate Anesthesia Transfer of Care Note  Patient: Omar [name]  Procedure(s) Performed: Procedure(s) with comments: CARPAL TUNNEL RELEASE (Right) - Right carpal tunnel release  Patient Location: PACU  Anesthesia Type:General  Level of Consciousness: awake  Airway & Oxygen Therapy: Patient Spontanous Breathing and Patient connected to face mask oxygen  Post-op Assessment: Report given to RN and Post -op Vital signs reviewed and stable  Post vital signs: Reviewed and stable  Last Vitals:  Filed Vitals:   01/12/16 1215  BP: 140/84  Pulse: 72  Temp: 36.7 C  Resp: 18    Last Pain:  Filed Vitals:   01/12/16 1216  PainSc: 1          Complications: No apparent anesthesia complications

## 2016-01-12 NOTE — Anesthesia Postprocedure Evaluation (Signed)
Anesthesia Post Note  Patient: Omar [name]  Procedure(s) Performed: Procedure(s) (LRB): CARPAL TUNNEL RELEASE (Right)  Patient location during evaluation: PACU Anesthesia Type: General Level of consciousness: awake and alert Pain management: pain level controlled Vital Signs Assessment: post-procedure vital signs reviewed and stable Respiratory status: spontaneous breathing, nonlabored ventilation, respiratory function stable and patient connected to nasal cannula oxygen Cardiovascular status: blood pressure returned to baseline and stable Postop Assessment: no signs of nausea or vomiting Anesthetic complications: no    Last Vitals:  Filed Vitals:   01/12/16 1215  BP: 140/84  Pulse: 72  Temp: 36.7 C  Resp: 18    Last Pain:  Filed Vitals:   01/12/16 1216  PainSc: 1                  Zenaida Deed

## 2016-01-12 NOTE — Anesthesia Procedure Notes (Signed)
Procedure Name: LMA Insertion Date/Time: 01/12/2016 1:15 PM Performed by: Lieutenant Diego Pre-anesthesia Checklist: Patient identified, Emergency Drugs available, Suction available and Patient being monitored Patient Re-evaluated:Patient Re-evaluated prior to inductionOxygen Delivery Method: Circle system utilized Preoxygenation: Pre-oxygenation with 100% oxygen Intubation Type: IV induction Ventilation: Mask ventilation without difficulty LMA: LMA inserted LMA Size: 5.0 Number of attempts: 1 Airway Equipment and Method: Bite block Placement Confirmation: positive ETCO2 and breath sounds checked- equal and bilateral Tube secured with: Tape Dental Injury: Teeth and Oropharynx as per pre-operative assessment

## 2016-01-15 ENCOUNTER — Encounter (HOSPITAL_BASED_OUTPATIENT_CLINIC_OR_DEPARTMENT_OTHER): Payer: Self-pay | Admitting: Orthopedic Surgery

## 2016-03-02 ENCOUNTER — Encounter: Payer: Self-pay | Admitting: Internal Medicine

## 2016-03-02 ENCOUNTER — Ambulatory Visit (INDEPENDENT_AMBULATORY_CARE_PROVIDER_SITE_OTHER): Payer: BLUE CROSS/BLUE SHIELD | Admitting: Internal Medicine

## 2016-03-02 VITALS — BP 130/84 | HR 80 | Temp 97.4°F | Ht 67.0 in | Wt 199.2 lb

## 2016-03-02 DIAGNOSIS — A09 Infectious gastroenteritis and colitis, unspecified: Secondary | ICD-10-CM

## 2016-03-02 DIAGNOSIS — K529 Noninfective gastroenteritis and colitis, unspecified: Secondary | ICD-10-CM

## 2016-03-02 NOTE — Progress Notes (Signed)
Subjective:    Patient ID: Omar [name], male    DOB: 1956-05-31, 60 y.o.   MRN: [id number]  HPI  This 60 yo MWM presents again with c/o diarrhea seen initially at 6/23/217 OV and c/o abrupt onset of diarrhea occurring tangential with a trip to [facility] and he was treated with Flagyl/Cipro and initially improved. Now he relates a couple of milder brief episodes, but notes a pattern of his 1st BM after in coffee in the am is soft to  partially to fully formed and then about noon he usually has an episode of explosive watery diarrhea and then does well the rest of the day until the next am. Denies fever, chills, rash, icterus, cramping or pain . Denies any id'd food triggers. Des take his Metformin 2 x/day.   Medication Sig  . aspirin 81 MG tablet Take 81 mg by mouth daily.  Marland Kitchen buPROPion (WELLBUTRIN SR) 150 MG 12 hr tablet Take 1 tablet (150 mg total) by mouth 2 (two) times daily.  . Cholecalciferol (VITAMIN D3) 5000 UNITS CAPS Take 1 capsule by mouth daily.  Marland Kitchen esomeprazole (NEXIUM) 40 MG capsule Take 40 mg by mouth 2 (two) times daily.  Marland Kitchen ezetimibe-simvastatin (VYTORIN) 10-40 MG per tablet Take 1 tablet by mouth at bedtime.  . fish oil-omega-3 fatty acids 1000 MG capsule Take 1 g by mouth 2 (two) times daily.   . fluticasone (FLONASE) 50 MCG/ACT nasal spray Place 2 sprays into both nostrils daily.  Marland Kitchen ketoconazole (NIZORAL) 2 % cream Apply 1 application topically daily. Apply 2 x / day to tinea versicolor rash  . levothyroxine (SYNTHROID, LEVOTHROID) 150 MCG tablet Take 150 mcg by mouth daily.  Marland Kitchen loratadine-pseudoephedrine (CLARITIN-D 12-HOUR) 5-120 MG per tablet Take 1 tablet by mouth 2 (two) times daily.   . metFORMIN (GLUCOPHAGE-XR) 500 MG 24 hr tablet TAKE 1 TABLET TWICE DAILY WITH BREAKFAST AND LUNCH, AND 2 TABLETS WITH SUPPER  . Multiple Vitamin (MULTIVITAMIN WITH MINERALS) TABS Take 1 tablet by mouth daily.  . tadalafil (CIALIS) 20 MG tablet 1 tablet every day  . testosterone cypionate  (DEPO-TESTOSTERONE) 200 MG/ML injection Inject 2 mLs (400 mg total) into the muscle every 14 (fourteen) days.  Marland Kitchen gabapentin (NEURONTIN) 300 MG capsule Take 1 capsule 3 x / day for Sciatica Neuritis pain  . HYDROcodone-acetaminophen (NORCO) 5-325 MG tablet Take 1 tablet by mouth every 6 (six) hours as needed for moderate pain.   No Known Allergies   Past Medical History:  Diagnosis Date  . Arthritis   . Complication of anesthesia    Pt states that he experienced some numbness on right side of tongue after surgery for 2 months; continues to experience gum sensitivity to date  . Depression   . Diabetes mellitus without complication ([place])   . DJD (degenerative joint disease)   . GERD (gastroesophageal reflux disease)   . Headache(784.0)    Sinus headaches  . Hyperlipidemia   . Hypertension   . Hypothyroidism   . Other testicular hypofunction   . Prediabetes   . Sleep apnea 2006   uses a cpap -desated to 55% during study, uses CPAP nightly  . Thyroid disease   . Tinnitus of both ears    Hx: of  . Vitamin D deficiency    Past Surgical History:  Procedure Laterality Date  . CARDIAC CATHETERIZATION  03/30/2010   normal coronaries, RCA takeoff of the left coronary cusp, EF 55% [facility])  . CARPAL TUNNEL RELEASE Right 01/12/2016  Procedure: CARPAL TUNNEL RELEASE;  Surgeon: Tania Ade, MD;  Location: [place];  Service: Orthopedics;  Laterality: Right;  Right carpal tunnel release  . COLONOSCOPY     x2  . KNEE ARTHROPLASTY Left 10/16/2012   Procedure: LEFT COMPUTER ASSISTED TOTAL KNEE ARTHROPLASTY;  Surgeon: Alta Corning, MD;  Location: [place];  Service: Orthopedics;  Laterality: Left;  . KNEE ARTHROSCOPY  05/12/2012   Procedure: ARTHROSCOPY KNEE;  Surgeon: Alta Corning, MD;  Location: [place];  Service: Orthopedics;  Laterality: Left;  . KNEE SURGERY    . MOUTH SURGERY    . TOTAL KNEE ARTHROPLASTY Left 10/16/2012   Dr Berenice Primas  . UPPER GI  ENDOSCOPY     x2   Review of Systems  10 point systems review negative except as above.    Objective:   Physical Exam  BP 130/84   Pulse 80   Temp 97.4 F (36.3 C)   Ht 5\' 7"  (1.702 m)   Wt 199 lb 3.2 oz (90.4 kg)   BMI 31.20 kg/m   HEENT - Eac's patent. TM's Nl. EOM's full. PERRLA. NasoOroPharynx clear. Neck - supple. Nl Thyroid. Carotids 2+ & No bruits, nodes, JVD Chest - Clear equal BS w/o Rales, rhonchi, wheezes. Cor - Nl HS. RRR w/o sig MGR. PP 1(+). No edema. Abd - No palpable organomegaly, masses or tenderness. BS nl. MS- FROM w/o deformities. Muscle power, tone and bulk Nl. Gait Nl. Neuro - No obvious Cr N abnormalities. Sensory, motor and Cerebellar functions appear Nl w/o focal abnormalities.    Assessment & Plan:   1. Gastroenteritis presumed infectious  - Gastrointestinal Pathogen Panel PCR - Recommended take 2 Imodium at Bk[facility] . Also recommended switch to take both his Metformin XR at suppertime .  - Avoid dairy.

## 2016-03-02 NOTE — Patient Instructions (Addendum)
Take 2 Imodium (Loperamide) 2 mg each Morning  ++++++++++++++++++++++  Take your Metformin 500 mg ER    X 2 tablets after supper  +++++++++++++++++++++++ Diarrhea Diarrhea is frequent loose and watery bowel movements. It can cause you to feel weak and dehydrated. Dehydration can cause you to become tired and thirsty, have a dry mouth, and have decreased urination that often is dark yellow. Diarrhea is a sign of another problem, most often an infection that will not last long. In most cases, diarrhea typically lasts 2-3 days. However, it can last longer if it is a sign of something more serious. It is important to treat your diarrhea as directed by your caregiver to lessen or prevent future episodes of diarrhea. CAUSES  Some common causes include:  Gastrointestinal infections caused by viruses, bacteria, or parasites.  Food poisoning or food allergies.  Certain medicines, such as antibiotics, chemotherapy, and laxatives.  Artificial sweeteners and fructose.  Digestive disorders. HOME CARE INSTRUCTIONS  Ensure adequate fluid intake (hydration): Have 1 cup (8 oz) of fluid for each diarrhea episode. Avoid fluids that contain simple sugars or sports drinks, fruit juices, whole milk products, and sodas. Your urine should be clear or pale yellow if you are drinking enough fluids. Hydrate with an oral rehydration solution that you can purchase at pharmacies, retail stores, and online. You can prepare an oral rehydration solution at home by mixing the following ingredients together:   - tsp table salt.   tsp baking soda.   tsp salt substitute containing potassium chloride.  1  tablespoons sugar.  1 L (34 oz) of water.  Certain foods and beverages may increase the speed at which food moves through the gastrointestinal (GI) tract. These foods and beverages should be avoided and include:  Caffeinated and alcoholic beverages.  High-fiber foods, such as raw fruits and vegetables, nuts,  seeds, and whole grain breads and cereals.  Foods and beverages sweetened with sugar alcohols, such as xylitol, sorbitol, and mannitol.  Some foods may be well tolerated and may help thicken stool including:  Starchy foods, such as rice, toast, pasta, low-sugar cereal, oatmeal, grits, baked potatoes, crackers, and bagels.  Bananas.  Applesauce.  Add probiotic-rich foods to help increase healthy bacteria in the GI tract, such as yogurt and fermented milk products.  Wash your hands well after each diarrhea episode.  Only take over-the-counter or prescription medicines as directed by your caregiver.  Take a warm bath to relieve any burning or pain from frequent diarrhea episodes. SEEK IMMEDIATE MEDICAL CARE IF:   You are unable to keep fluids down.  You have persistent vomiting.  You have blood in your stool, or your stools are black and tarry.  You do not urinate in 6-8 hours, or there is only a small amount of very dark urine.  You have abdominal pain that increases or localizes.  You have weakness, dizziness, confusion, or light-headedness.  You have a severe headache.  Your diarrhea gets worse or does not get better.  You have a fever or persistent symptoms for more than 2-3 days.  You have a fever and your symptoms suddenly get worse. MAKE SURE YOU:   Understand these instructions.  Will watch your condition.  Will get help right away if you are not doing well or get worse.   This information is not intended to replace advice given to you by your health care provider. Make sure you discuss any questions you have with your health care provider.   Document  Released: 06/11/2002 Document Revised: 07/12/2014 Document Reviewed: 02/27/2012 Elsevier Interactive Patient Education [employer].

## 2016-03-05 LAB — GASTROINTESTINAL PATHOGEN PANEL PCR
C. difficile Tox A/B, PCR: NOT DETECTED
Campylobacter, PCR: NOT DETECTED
Cryptosporidium, PCR: NOT DETECTED
E coli (ETEC) LT/ST PCR: NOT DETECTED
E coli (STEC) stx1/stx2, PCR: NOT DETECTED
E coli 0157, PCR: NOT DETECTED
Giardia lamblia, PCR: NOT DETECTED
Norovirus, PCR: NOT DETECTED
Rotavirus A, PCR: NOT DETECTED
Salmonella, PCR: NOT DETECTED
Shigella, PCR: NOT DETECTED

## 2016-03-06 ENCOUNTER — Encounter: Payer: Self-pay | Admitting: Internal Medicine

## 2016-03-26 ENCOUNTER — Ambulatory Visit (INDEPENDENT_AMBULATORY_CARE_PROVIDER_SITE_OTHER): Payer: BLUE CROSS/BLUE SHIELD | Admitting: Physician Assistant

## 2016-03-26 ENCOUNTER — Encounter: Payer: Self-pay | Admitting: Physician Assistant

## 2016-03-26 VITALS — BP 126/74 | HR 86 | Temp 97.0°F | Resp 16 | Ht 67.0 in | Wt 205.0 lb

## 2016-03-26 DIAGNOSIS — J324 Chronic pansinusitis: Secondary | ICD-10-CM

## 2016-03-26 MED ORDER — AZITHROMYCIN 250 MG PO TABS: ORAL_TABLET | ORAL | 1 refills | Status: AC

## 2016-03-26 MED ORDER — PREDNISONE 20 MG PO TABS: ORAL_TABLET | ORAL | 0 refills | Status: DC

## 2016-03-26 NOTE — Patient Instructions (Addendum)
Please take the prednisone to help decrease inflammation and therefore decrease symptoms. Take it it with food to avoid GI upset. It can cause increased energy but on the other hand it can make it hard to sleep at night so please take it AT [place], it takes 8-12 hours to start working so it will NOT affect your sleeping if you take it at night with your food!!  If you are diabetic it will increase your sugars so decrease carbs and monitor your sugars closely.    PREDNISONE IS NOT AN ANTIBIOTIC, YOU CAN TAKE 1-2 DAYS AND STOP IT IF YOU WANT  GET ON FLONASE WITH THE ALLERGY PILLS TAKE ZPAK IF NOT BETTER  HOW TO TREAT VIRAL COUGH AND COLD SYMPTOMS:  -Symptoms usually last at least 1 week with the worst symptoms being around day 4.  - colds usually start with a sore throat and end with a cough, and the cough can take 2 weeks to get better.  -No antibiotics are needed for colds, flu, sore throats, cough, bronchitis UNLESS symptoms are longer than 7 days OR if you are getting better then get drastically worse.  -There are a lot of combination medications (Dayquil, Nyquil, Vicks 44, tyelnol cold and sinus, ETC). Please look at the ingredients on the back so that you are treating the correct symptoms and not doubling up on medications/ingredients.    Medicines you can use  Nasal congestion  - pseudoephedrine (Sudafed)- behind the counter, do not use if you have high blood pressure, medicine that have -D in them.  - phenylephrine (Sudafed PE) -Dextormethorphan + chlorpheniramine (Coridcidin HBP)- okay if you have high blood pressure -Oxymetazoline (Afrin) nasal spray- LIMIT to 3 days -Saline nasal spray -Neti pot (used distilled or bottled water)  Ear pain/congestion  -pseudoephedrine (sudafed) - Nasonex/flonase nasal spray  Fever  -Acetaminophen (Tyelnol) -Ibuprofen (Advil, motrin, aleve)  Sore Throat  -Acetaminophen (Tyelnol) -Ibuprofen (Advil, motrin, aleve) -Drink a lot of  water -Gargle with salt water - Rest your voice (don't talk) -Throat sprays -Cough drops  Body Aches  -Acetaminophen (Tyelnol) -Ibuprofen (Advil, motrin, aleve)  Headache  -Acetaminophen (Tyelnol) -Ibuprofen (Advil, motrin, aleve) - Exedrin, Exedrin Migraine  Allergy symptoms (cough, sneeze, runny nose, itchy eyes) -Claritin or loratadine cheapest but likely the weakest  -Zyrtec or certizine at night because it can make you sleepy -The strongest is allegra or fexafinadine  Cheapest at [employer], [employer], [employer]  Cough  -Dextromethorphan (Delsym)- medicine that has DM in it -Guafenesin (Mucinex/Robitussin) - cough drops - drink lots of water  Chest Congestion  -Guafenesin (Mucinex/Robitussin)  Red Itchy Eyes  - Naphcon-A  Upset Stomach  - Bland diet (nothing spicy, greasy, fried, and high acid foods like tomatoes, oranges, berries) -OKAY- cereal, bread, soup, crackers, rice -Eat smaller more frequent meals -reduce caffeine, no alcohol -Loperamide (Imodium-AD) if diarrhea -Prevacid for heart burn  General health when sick  -Hydration -wash your hands frequently -keep surfaces clean -change pillow cases and sheets often -Get fresh air but do not exercise strenuously -Vitamin D, double up on it - Vitamin C -Zinc

## 2016-03-26 NOTE — Progress Notes (Signed)
Subjective:    Patient ID: Omar [name], male    DOB: January 05, 1956, 60 y.o.   MRN: [id number]  HPI 60 y.o. obese WM with history of HTN, OSA, DM2 presents with sinus infection x 3-4 days. He is on claritin and decongestant, not on flonase, every morning. Feels sinus pressure, sore throat, non productive cough. no fever, chills, SOB, CP.   Blood pressure 126/74, pulse 86, temperature 97 F (36.1 C), resp. rate 16, height 5\' 7"  (1.702 m), weight 205 lb (93 kg), SpO2 97 %.  Medications Current Outpatient Prescriptions on File Prior to Visit  Medication Sig  . aspirin 81 MG tablet Take 81 mg by mouth daily.  Marland Kitchen buPROPion (WELLBUTRIN SR) 150 MG 12 hr tablet Take 1 tablet (150 mg total) by mouth 2 (two) times daily.  . Cholecalciferol (VITAMIN D3) 5000 UNITS CAPS Take 1 capsule by mouth daily.  Marland Kitchen esomeprazole (NEXIUM) 40 MG capsule Take 40 mg by mouth 2 (two) times daily.  Marland Kitchen ezetimibe-simvastatin (VYTORIN) 10-40 MG per tablet Take 1 tablet by mouth at bedtime.  . fish oil-omega-3 fatty acids 1000 MG capsule Take 1 g by mouth 2 (two) times daily.   . fluticasone (FLONASE) 50 MCG/ACT nasal spray Place 2 sprays into both nostrils daily.  Marland Kitchen ketoconazole (NIZORAL) 2 % cream Apply 1 application topically daily. Apply 2 x / day to tinea versicolor rash  . levothyroxine (SYNTHROID, LEVOTHROID) 150 MCG tablet Take 150 mcg by mouth daily.  Marland Kitchen loratadine-pseudoephedrine (CLARITIN-D 12-HOUR) 5-120 MG per tablet Take 1 tablet by mouth 2 (two) times daily.   . metFORMIN (GLUCOPHAGE-XR) 500 MG 24 hr tablet TAKE 1 TABLET TWICE DAILY WITH BREAKFAST AND LUNCH, AND 2 TABLETS WITH SUPPER  . Multiple Vitamin (MULTIVITAMIN WITH MINERALS) TABS Take 1 tablet by mouth daily.  . tadalafil (CIALIS) 20 MG tablet 1 tablet every day  . testosterone cypionate (DEPO-TESTOSTERONE) 200 MG/ML injection Inject 2 mLs (400 mg total) into the muscle every 14 (fourteen) days.  Marland Kitchen gabapentin (NEURONTIN) 300 MG capsule Take 1 capsule 3  x / day for Sciatica Neuritis pain   No current facility-administered medications on file prior to visit.     Problem list He has Osteoarthritis of left knee; Hypertension; Hyperlipidemia; DJD; Hypothyroidism; Vitamin D deficiency; OSA on CPAP; Testosterone deficiency; Medication management; T2_NIDDM w/Stage 2 CKD (GFR 72 ml/min); Morbid obesity (BMI 31.35); and BMI 31.0-31.9,adult on his problem list.  Review of Systems  Constitutional: Negative for chills, diaphoresis and fever.  HENT: Positive for congestion, sinus pressure and sore throat. Negative for ear pain, sneezing, trouble swallowing and voice change.   Eyes: Negative.   Respiratory: Positive for cough. Negative for chest tightness, shortness of breath and wheezing.   Cardiovascular: Negative.   Gastrointestinal: Negative.   Genitourinary: Negative.   Musculoskeletal: Negative for neck pain.  Neurological: Negative.  Negative for headaches.       Objective:   Physical Exam  Constitutional: He is oriented to person, place, and time. He appears well-developed and well-nourished.  HENT:  Head: Normocephalic and atraumatic.  Right Ear: External ear normal.  Left Ear: External ear normal.  Mouth/Throat: Oropharynx is clear and moist.  Eyes: Conjunctivae and EOM are normal. Pupils are equal, round, and reactive to light.  Neck: Normal range of motion. Neck supple.  Cardiovascular: Normal rate, regular rhythm and normal heart sounds.   Pulmonary/Chest: Effort normal and breath sounds normal.  Abdominal: Soft. Bowel sounds are normal.  Musculoskeletal: Normal range of motion.  Neurological: He is alert and oriented to person, place, and time. No cranial nerve deficit.  Skin: Skin is warm and dry.  Psychiatric: He has a normal mood and affect. His behavior is normal.       Assessment & Plan:  1. Pansinusitis, unspecified chronicity Will hold the zpak and take if she is not getting better, increase fluids, rest, cont  allergy pill - predniSONE (DELTASONE) 20 MG tablet; 2 tablets daily for 3 days, 1 tablet daily for 4 days.  Dispense: 10 tablet; Refill: 0 - azithromycin (ZITHROMAX) 250 MG tablet; Take 2 tablets (500 mg) on  Day 1,  followed by 1 tablet (250 mg) once daily on Days 2 through 5.  Dispense: 6 each; Refill: 1

## 2016-03-30 ENCOUNTER — Encounter: Payer: Self-pay | Admitting: Internal Medicine

## 2016-06-17 ENCOUNTER — Ambulatory Visit (INDEPENDENT_AMBULATORY_CARE_PROVIDER_SITE_OTHER): Payer: BLUE CROSS/BLUE SHIELD | Admitting: Internal Medicine

## 2016-06-17 VITALS — BP 120/80 | HR 84 | Temp 97.5°F | Resp 16 | Ht 67.0 in | Wt 197.4 lb

## 2016-06-17 DIAGNOSIS — Z0000 Encounter for general adult medical examination without abnormal findings: Secondary | ICD-10-CM | POA: Diagnosis not present

## 2016-06-17 DIAGNOSIS — N182 Chronic kidney disease, stage 2 (mild): Secondary | ICD-10-CM

## 2016-06-17 DIAGNOSIS — I10 Essential (primary) hypertension: Secondary | ICD-10-CM | POA: Diagnosis not present

## 2016-06-17 DIAGNOSIS — E079 Disorder of thyroid, unspecified: Secondary | ICD-10-CM

## 2016-06-17 DIAGNOSIS — J301 Allergic rhinitis due to pollen: Secondary | ICD-10-CM

## 2016-06-17 DIAGNOSIS — E1122 Type 2 diabetes mellitus with diabetic chronic kidney disease: Secondary | ICD-10-CM

## 2016-06-17 DIAGNOSIS — Z1212 Encounter for screening for malignant neoplasm of rectum: Secondary | ICD-10-CM

## 2016-06-17 DIAGNOSIS — Z0001 Encounter for general adult medical examination with abnormal findings: Secondary | ICD-10-CM

## 2016-06-17 DIAGNOSIS — E559 Vitamin D deficiency, unspecified: Secondary | ICD-10-CM | POA: Diagnosis not present

## 2016-06-17 DIAGNOSIS — R5383 Other fatigue: Secondary | ICD-10-CM

## 2016-06-17 DIAGNOSIS — E349 Endocrine disorder, unspecified: Secondary | ICD-10-CM

## 2016-06-17 DIAGNOSIS — Z79899 Other long term (current) drug therapy: Secondary | ICD-10-CM

## 2016-06-17 DIAGNOSIS — Z125 Encounter for screening for malignant neoplasm of prostate: Secondary | ICD-10-CM

## 2016-06-17 DIAGNOSIS — Z136 Encounter for screening for cardiovascular disorders: Secondary | ICD-10-CM | POA: Diagnosis not present

## 2016-06-17 DIAGNOSIS — Z9989 Dependence on other enabling machines and devices: Secondary | ICD-10-CM

## 2016-06-17 DIAGNOSIS — E782 Mixed hyperlipidemia: Secondary | ICD-10-CM

## 2016-06-17 DIAGNOSIS — G4733 Obstructive sleep apnea (adult) (pediatric): Secondary | ICD-10-CM

## 2016-06-17 LAB — CBC WITH DIFFERENTIAL/PLATELET
Basophils Absolute: 70 cells/uL (ref 0–200)
Basophils Relative: 1 %
Eosinophils Absolute: 70 cells/uL (ref 15–500)
Eosinophils Relative: 1 %
HCT: 50.4 % — ABNORMAL HIGH (ref 38.5–50.0)
Hemoglobin: 17 g/dL (ref 13.2–17.1)
Lymphocytes Relative: 18 %
Lymphs Abs: 1260 cells/uL (ref 850–3900)
MCH: 29.5 pg (ref 27.0–33.0)
MCHC: 33.7 g/dL (ref 32.0–36.0)
MCV: 87.3 fL (ref 80.0–100.0)
MPV: 9.3 fL (ref 7.5–12.5)
Monocytes Absolute: 140 cells/uL — ABNORMAL LOW (ref 200–950)
Monocytes Relative: 2 %
Neutro Abs: 5460 cells/uL (ref 1500–7800)
Neutrophils Relative %: 78 %
Platelets: 291 10*3/uL (ref 140–400)
RBC: 5.77 MIL/uL (ref 4.20–5.80)
RDW: 15 % (ref 11.0–15.0)
WBC: 7 10*3/uL (ref 3.8–10.8)

## 2016-06-17 LAB — VITAMIN B12: Vitamin B-12: 480 pg/mL (ref 200–1100)

## 2016-06-17 LAB — TSH: TSH: 0.53 mIU/L (ref 0.40–4.50)

## 2016-06-17 LAB — HEMOGLOBIN A1C
Hgb A1c MFr Bld: 7.6 % — ABNORMAL HIGH (ref <5.7)
Mean Plasma Glucose: 171 mg/dL

## 2016-06-17 LAB — PSA: PSA: 1 ng/mL (ref <=4.0)

## 2016-06-17 MED ORDER — IPRATROPIUM BROMIDE 0.06 % NA SOLN: NASAL | 3 refills | Status: DC

## 2016-06-17 NOTE — Progress Notes (Signed)
[place] [facility]   Unk Pinto, M.D.    Uvaldo Bristle. Silverio Lay, P.A.-C      Starlyn Skeans, P.A.-C  [facility]                [address] [place], [place] [id number] Telephone [phone] Telefax [phone] Annual  Screening/Preventative Visit  & Comprehensive Evaluation & Examination     This very nice 60 y.o. MWM presents for a Screening/Preventative Visit & comprehensive evaluation and management of multiple medical co-morbidities.  Patient has been followed for HTN, T2_NIDDM, Hyperlipidemia and Vitamin D Deficiency.     HTN predates circa 2008. Patient's BP has been controlled at home.  In 2011 he had a False (+) Cardiolite with a Negative Heart cath thru the [place]. Today's BP is at goal - 120/80. Patient denies any cardiac symptoms as chest pain, palpitations, shortness of breath, dizziness or ankle swelling.     Patient's hyperlipidemia is controlled with diet and patient has self discontinued his medications. Patient denies myalgias or other medication SE's when on medications. Last lipids were not at goal: Lab Results  Component Value Date   CHOL 228 (H) 08/20/2015   HDL 35 (L) 08/20/2015   LDLCALC NOT CALC 08/20/2015   TRIG 734 (H) 08/20/2015   CHOLHDL 6.5 (H) 08/20/2015      Patient has hx/o Morbid Obesity (BMI 31) and consequent T2_NIDDM circa 2014 and patient denies reactive hypoglycemic symptoms, visual blurring, diabetic polys or paresthesias. Patient has little insight or motivation to improve his diabetic management and his last A1c was not at goal: Lab Results  Component Value Date   HGBA1C 9.1 (H) 08/20/2015       Finally, patient has history of Vitamin D Deficiency  On 2008 of "27" and last vitamin D was still low: Lab Results  Component Value Date   VD25OH 42 08/20/2015   Current Outpatient Prescriptions on File Prior to Visit  Medication Sig  . aspirin 81 MG tablet Take 81 mg by  mouth daily.  Marland Kitchen buPROPion (WELLBUTRIN SR) 150 MG 12 hr tablet Take 1 tablet (150 mg total) by mouth 2 (two) times daily.  . Cholecalciferol (VITAMIN D3) 5000 UNITS CAPS Take 1 capsule by mouth daily.  Marland Kitchen esomeprazole (NEXIUM) 40 MG capsule Take 40 mg by mouth 2 (two) times daily.  Marland Kitchen ezetimibe-simvastatin (VYTORIN) 10-40 MG per tablet Take 1 tablet by mouth at bedtime.  . fish oil-omega-3 fatty acids 1000 MG capsule Take 1 g by mouth 2 (two) times daily.   . fluticasone (FLONASE) 50 MCG/ACT nasal spray Place 2 sprays into both nostrils daily.  Marland Kitchen gabapentin (NEURONTIN) 300 MG capsule Take 1 capsule 3 x / day for Sciatica Neuritis pain  . ketoconazole (NIZORAL) 2 % cream Apply 1 application topically daily. Apply 2 x / day to tinea versicolor rash  . levothyroxine (SYNTHROID, LEVOTHROID) 150 MCG tablet Take 150 mcg by mouth daily.  Marland Kitchen loratadine-pseudoephedrine (CLARITIN-D 12-HOUR) 5-120 MG per tablet Take 1 tablet by mouth 2 (two) times daily.   . metFORMIN (GLUCOPHAGE-XR) 500 MG 24 hr tablet TAKE 1 TABLET TWICE DAILY WITH BREAKFAST AND LUNCH, AND 2 TABLETS WITH SUPPER  . Multiple Vitamin (MULTIVITAMIN WITH MINERALS) TABS Take 1 tablet by mouth daily.  . predniSONE (DELTASONE) 20 MG tablet 2 tablets daily for 3 days, 1 tablet daily for 4 days.  . tadalafil (CIALIS)  20 MG tablet 1 tablet every day  . testosterone cypionate (DEPO-TESTOSTERONE) 200 MG/ML injection Inject 2 mLs (400 mg total) into the muscle every 14 (fourteen) days.   No current facility-administered medications on file prior to visit.    No Known Allergies Past Medical History:  Diagnosis Date  . Arthritis   . Complication of anesthesia    Pt states that he experienced some numbness on right side of tongue after surgery for 2 months; continues to experience gum sensitivity to date  . Depression   . Diabetes mellitus without complication ([place])   . DJD (degenerative joint disease)   . GERD (gastroesophageal reflux disease)   .  Headache(784.0)    Sinus headaches  . Hyperlipidemia   . Hypertension   . Hypothyroidism   . Other testicular hypofunction   . Prediabetes   . Sleep apnea 2006   uses a cpap -desated to 55% during study, uses CPAP nightly  . Thyroid disease   . Tinnitus of both ears    Hx: of  . Vitamin D deficiency    Health Maintenance  Topic Date Due  . COLONOSCOPY  06/23/2007  . OPHTHALMOLOGY EXAM  10/04/2015  . INFLUENZA VACCINE  02/03/2016  . HEMOGLOBIN A1C  02/17/2016  . PNEUMOCOCCAL POLYSACCHARIDE VACCINE (2) 04/11/2016  . FOOT EXAM  05/11/2016  . URINE MICROALBUMIN  05/11/2016  . TETANUS/TDAP  04/11/2021  . Hepatitis C Screening  Completed  . HIV Screening  Completed   Immunization History  Administered Date(s) Administered  . Influenza Split 04/18/2013  . Influenza, Seasonal, Injecte, Preservative Fre 06/16/2015  . Pneumococcal Polysaccharide-23 04/12/2011  . Tdap 04/12/2011   Past Surgical History:  Procedure Laterality Date  . CARDIAC CATHETERIZATION  03/30/2010   normal coronaries, RCA takeoff of the left coronary cusp, EF 55% [facility])  . CARPAL TUNNEL RELEASE Right 01/12/2016   Procedure: CARPAL TUNNEL RELEASE;  Surgeon: Tania Ade, MD;  Location: [place];  Service: Orthopedics;  Laterality: Right;  Right carpal tunnel release  . COLONOSCOPY     x2  . KNEE ARTHROPLASTY Left 10/16/2012   Procedure: LEFT COMPUTER ASSISTED TOTAL KNEE ARTHROPLASTY;  Surgeon: Alta Corning, MD;  Location: [place];  Service: Orthopedics;  Laterality: Left;  . KNEE ARTHROSCOPY  05/12/2012   Procedure: ARTHROSCOPY KNEE;  Surgeon: Alta Corning, MD;  Location: [place];  Service: Orthopedics;  Laterality: Left;  . KNEE SURGERY    . MOUTH SURGERY    . TOTAL KNEE ARTHROPLASTY Left 10/16/2012   Dr Berenice Primas  . UPPER GI ENDOSCOPY     x2   Family History  Problem Relation Age of Onset  . Cancer Mother     breast  . Hyperlipidemia Mother   . Hypertension  Mother   . Cancer Father     pancreatic   Social History   Social History  . Marital status: Married    Spouse name: Ivin Booty  . Number of children: 2 daughters  . Years of education: College grad   Occupational History  . Realtor   Social History Main Topics  . Smoking status: Former Smoker    Packs/day: 1.00    Years: 40.00    Types: Cigarettes    Quit date: 04/14/2015  . Smokeless tobacco: Never Used     Comment: "Vaping"   . Alcohol use No     Comment: rarely  . Drug use: No  . Sexual activity: Yes    ROS Constitutional: Denies  fever, chills, weight loss/gain, headaches, insomnia,  night sweats or change in appetite. Does c/o fatigue. Eyes: Denies redness, blurred vision, diplopia, discharge, itchy or watery eyes.  ENT: Denies discharge, congestion, post nasal drip, epistaxis, sore throat, earache, hearing loss, dental pain, Tinnitus, Vertigo, Sinus pain or snoring.  Cardio: Denies chest pain, palpitations, irregular heartbeat, syncope, dyspnea, diaphoresis, orthopnea, PND, claudication or edema Respiratory: denies cough, dyspnea, DOE, pleurisy, hoarseness, laryngitis or wheezing.  Gastrointestinal: Denies dysphagia, heartburn, reflux, water brash, pain, cramps, nausea, vomiting, bloating, diarrhea, constipation, hematemesis, melena, hematochezia, jaundice or hemorrhoids Genitourinary: Denies dysuria, frequency, urgency, nocturia, hesitancy, discharge, hematuria or flank pain Musculoskeletal: Denies arthralgia, myalgia, stiffness, Jt. Swelling, pain, limp or strain/sprain. Denies Falls. Skin: Denies puritis, rash, hives, warts, acne, eczema or change in skin lesion Neuro: No weakness, tremor, incoordination, spasms, paresthesia or pain Psychiatric: Denies confusion, memory loss or sensory loss. Denies Depression. Endocrine: Denies change in weight, skin, hair change, nocturia, and paresthesia, diabetic polys, visual blurring or hyper / hypo glycemic episodes.  Heme/Lymph:  No excessive bleeding, bruising or enlarged lymph nodes.  Physical Exam  BP 120/80   Pulse 84   Temp 97.5 F (36.4 C)   Resp 16   Ht 5\' 7"  (1.702 m)   Wt 197 lb 6.4 oz (89.5 kg)   BMI 30.92 kg/m   General Appearance: Well nourished, in no apparent distress.  Eyes: PERRLA, EOMs, conjunctiva no swelling or erythema, normal fundi and vessels. Sinuses: No frontal/maxillary tenderness ENT/Mouth: EACs patent / TMs  nl. Nares clear without erythema, swelling, mucoid exudates. Oral hygiene is good. No erythema, swelling, or exudate. Tongue normal, non-obstructing. Tonsils not swollen or erythematous. Hearing normal.  Neck: Supple, thyroid normal. No bruits, nodes or JVD. Respiratory: Respiratory effort normal.  BS equal and clear bilateral without rales, rhonci, wheezing or stridor. Cardio: Heart sounds are normal with regular rate and rhythm and no murmurs, rubs or gallops. Peripheral pulses are normal and equal bilaterally without edema. No aortic or femoral bruits. Chest: symmetric with normal excursions and percussion.  Abdomen: Soft, with Nl bowel sounds. Nontender, no guarding, rebound, hernias, masses, or organomegaly.  Lymphatics: Non tender without lymphadenopathy.  Genitourinary: No hernias.Testes nl. DRE - prostate nl for age - smooth & firm w/o nodules. Musculoskeletal: Full ROM all peripheral extremities, joint stability, 5/5 strength, and normal gait. Skin: Warm and dry without rashes, lesions, cyanosis, clubbing or  ecchymosis.  Neuro: Cranial nerves intact, reflexes equal bilaterally. Normal muscle tone, no cerebellar symptoms. Sensation intact.  Pysch: Alert and oriented X 3 with normal affect, insight and judgment appropriate.   Assessment and Plan  1. Annual Preventative/Screening Exam    2. Essential hypertension  - Microalbumin / creatinine urine ratio - Urinalysis, Routine w reflex microscopic - CBC with Differential/Platelet - BASIC METABOLIC PANEL WITH GFR -  TSH  3. Mixed hyperlipidemia  - Hepatic function panel - Lipid panel - TSH  4. T2_NIDDM with Stage 2 CKD (HCC)  - Microalbumin / creatinine urine ratio - HM DIABETES FOOT EXAM - LOW EXTREMITY NEUR EXAM DOCUM - Hemoglobin A1c - Insulin, random  5. Vitamin D deficiency  - VITAMIN D 25 Hydroxy (Vit-D Deficiency, Fractures)  6. Testosterone deficiency   7. OSA on CPAP   8. Hypothyroidism   9. Screening for rectal cancer  - POC Hemoccult Bld/Stl   10. Prostate cancer screening  - PSA  11. Allergic rhinitis due to pollen, unspecified chronicity, unspecified seasonality  - ipratropium (ATROVENT) 0.06 % nasal spray; 2  sprays each nostril 2 to 3 x/daily  Dispense: 45 mL; Refill: 3  12. Fatigue, unspecified type  - Vitamin B12 - Iron and TIBC - Testosterone - CBC with Differential/Platelet - TSH  13. Medication management  - Urinalysis, Routine w reflex microscopic - CBC with Differential/Platelet - BASIC METABOLIC PANEL WITH GFR - Hepatic function panel - Magnesium      Continue prudent diet as discussed, weight control, BP monitoring, regular exercise, and medications as discussed.  Discussed med effects and SE's. Routine screening labs and tests as requested with regular follow-up as recommended. Over 40 minutes of exam, counseling, chart review and high complex critical decision making was performed

## 2016-06-17 NOTE — Patient Instructions (Signed)

## 2016-06-18 ENCOUNTER — Other Ambulatory Visit: Payer: Self-pay | Admitting: Internal Medicine

## 2016-06-18 DIAGNOSIS — E782 Mixed hyperlipidemia: Secondary | ICD-10-CM

## 2016-06-18 DIAGNOSIS — E559 Vitamin D deficiency, unspecified: Secondary | ICD-10-CM

## 2016-06-18 LAB — URINALYSIS, ROUTINE W REFLEX MICROSCOPIC
Bilirubin Urine: NEGATIVE
Hgb urine dipstick: NEGATIVE
Ketones, ur: NEGATIVE
Leukocytes, UA: NEGATIVE
Nitrite: NEGATIVE
Protein, ur: NEGATIVE
Specific Gravity, Urine: 1.019 (ref 1.001–1.035)
pH: 7.5 (ref 5.0–8.0)

## 2016-06-18 LAB — MICROALBUMIN / CREATININE URINE RATIO
Creatinine, Urine: 74 mg/dL (ref 20–370)
Microalb Creat Ratio: 12 mcg/mg creat (ref <30)
Microalb, Ur: 0.9 mg/dL

## 2016-06-18 LAB — LIPID PANEL
Cholesterol: 303 mg/dL — ABNORMAL HIGH (ref <200)
HDL: 43 mg/dL (ref >40)
Total CHOL/HDL Ratio: 7 Ratio — ABNORMAL HIGH (ref <5.0)
Triglycerides: 1242 mg/dL — ABNORMAL HIGH (ref <150)

## 2016-06-18 LAB — BASIC METABOLIC PANEL WITH GFR
BUN: 16 mg/dL (ref 7–25)
CO2: 17 mmol/L — ABNORMAL LOW (ref 20–31)
Calcium: 9.8 mg/dL (ref 8.6–10.3)
Chloride: 101 mmol/L (ref 98–110)
Creat: 1.21 mg/dL (ref 0.70–1.33)
GFR, Est African American: 76 mL/min (ref >=60)
GFR, Est Non African American: 66 mL/min (ref >=60)
Glucose, Bld: 193 mg/dL — ABNORMAL HIGH (ref 65–99)
Potassium: 4.8 mmol/L (ref 3.5–5.3)
Sodium: 137 mmol/L (ref 135–146)

## 2016-06-18 LAB — HEPATIC FUNCTION PANEL
ALT: 18 U/L (ref 9–46)
AST: 18 U/L (ref 10–35)
Albumin: 4.4 g/dL (ref 3.6–5.1)
Alkaline Phosphatase: 53 U/L (ref 40–115)
Bilirubin, Direct: 0 mg/dL (ref <=0.2)
Indirect Bilirubin: 0.4 mg/dL (ref 0.2–1.2)
Total Bilirubin: 0.4 mg/dL (ref 0.2–1.2)
Total Protein: 7 g/dL (ref 6.1–8.1)

## 2016-06-18 LAB — VITAMIN D 25 HYDROXY (VIT D DEFICIENCY, FRACTURES)

## 2016-06-18 LAB — TESTOSTERONE: Testosterone: 368 ng/dL (ref 250–827)

## 2016-06-18 LAB — IRON AND TIBC
%SAT: 45 % (ref 15–60)
Iron: 126 ug/dL (ref 50–180)
TIBC: 283 ug/dL (ref 250–425)
UIBC: 157 ug/dL (ref 125–400)

## 2016-06-18 LAB — INSULIN, RANDOM: Insulin: 9.4 u[IU]/mL (ref 2.0–19.6)

## 2016-06-18 LAB — MAGNESIUM: Magnesium: 2 mg/dL (ref 1.5–2.5)

## 2016-06-18 MED ORDER — FENOFIBRATE MICRONIZED 134 MG PO CAPS
ORAL_CAPSULE | ORAL | 2 refills | Status: DC
Start: 2016-06-18 — End: 2016-10-06

## 2016-06-18 MED ORDER — EZETIMIBE 10 MG PO TABS: ORAL_TABLET | ORAL | 2 refills | Status: DC

## 2016-06-19 ENCOUNTER — Encounter: Payer: Self-pay | Admitting: Internal Medicine

## 2016-06-20 NOTE — Addendum Note (Signed)
Addended by: Audra Bellard A on: 06/20/2016 05:48 PM   Modules accepted: Orders

## 2016-07-27 ENCOUNTER — Ambulatory Visit (INDEPENDENT_AMBULATORY_CARE_PROVIDER_SITE_OTHER): Payer: BLUE CROSS/BLUE SHIELD | Admitting: Internal Medicine

## 2016-07-27 ENCOUNTER — Encounter: Payer: Self-pay | Admitting: Internal Medicine

## 2016-07-27 VITALS — BP 122/84 | HR 88 | Temp 97.9°F | Resp 16 | Ht 67.0 in | Wt 200.2 lb

## 2016-07-27 DIAGNOSIS — R1031 Right lower quadrant pain: Secondary | ICD-10-CM

## 2016-07-27 DIAGNOSIS — E782 Mixed hyperlipidemia: Secondary | ICD-10-CM

## 2016-07-27 DIAGNOSIS — R1030 Lower abdominal pain, unspecified: Secondary | ICD-10-CM

## 2016-07-27 DIAGNOSIS — J300 Vasomotor rhinitis: Secondary | ICD-10-CM

## 2016-07-27 MED ORDER — PREDNISONE 20 MG PO TABS: ORAL_TABLET | ORAL | 0 refills | Status: DC

## 2016-07-27 MED ORDER — AZELASTINE HCL 0.1 % NA SOLN: NASAL | 1 refills | Status: DC

## 2016-07-27 NOTE — Progress Notes (Signed)
[place] [facility]   Unk Pinto, M.D.    Uvaldo Bristle. Silverio Lay, P.A.-C      Starlyn Skeans, P.A.-C  [facility]                [address] [place], [place] [id number] Telephone [phone] Telefax [phone]  Subjective:    Patient ID: Omar [name], male    DOB: [birth year], 61 y.o.   MRN: [id number]  HPI  This very nice 61 yo MWM with HTN, HLD, T2_DM presents with Rt groin pain which on exam was not found to gave an inguinal bulge or tenderness of the inguinal ring, but was felt to have tenderness of the insertion of the Rt thigh adductor tendons and he was Rx'd a prednisone taper. Then after returning home and an extensive internet search, he disavows any tenderness or discomfort of his adductor insertion site, but rather that he feels a pressure, burning, aching  or bulge sensation in his Rt inguinal/groin area associated with lifting, bending, coughing or straining.     The other problems addressed today include his chronic posterior pharyngeal drainage which causes him to have a choking sensation, especially in the morning and requires a cough type maneuver to try to clear the mucus that he is never able to expectorate. He reports that he has no difficulty with nasal stuffiness/congestion, difficulty breathing or external drainage and that it interferes with his only pleasure in life which is to sing. He has tried all of the OTC antihistamines  Including Claritin, Claritin-D, Zyrtec, Allegra - as well as several types of nasal steroids - all to no avail. He has been seen by ENT here in [place] and also at the [facility] clinics and had X-Rays and fiberoptic naso pharyngoscopy. He apparently had a negative experience seeing an allergist who wanted to start him on allergy shots with alleged negative to equivocal allergy testing.     Finally, patient had very abnormal lipid profile on Dec 14th , with elevated Chol 303 and  Trig's 1,242  (A1c was 7.6%) and desires to recheck the values today.   Medication Sig  . aspirin 81 MG tablet Take daily.  Marland Kitchen buPROPion-SR 150 MG 12 hr = Take 1 tab 2  times daily.  Marland Kitchen VITAMIN D 5000 UNITS  Take 1 cap daily.  Marland Kitchen esomeprazole 40 MG  Take  2  times daily.  Marland Kitchen ezetimibe 10 MG tablet Take 1 tab daily for Cholesterol  . fenofibrate  134 MG capsule Take 1 tab daily for Blood Fats  . fish oil-omega 1000 MG  Take 1 g  2  times daily.   Marland Kitchen FLONASE 50 MCG/ACT nasal spray Place 2 sprays into both nostrils daily.  . ATROVENT 0.06 % nasal spray 2 sprays each nostril 2 to 3 x  / daily  . ketoconazole ( 2 % cream Apply 2 x / day to tinea versicolor rash  . levothyroxine  150 MCG  Take daily.  Marland Kitchen CLARITIN-D 12-HR 5-120 MG  Take 1 tab 2  times daily.   . metFORMIN-XR 500 MG  TAKE 1 TAB TWICE DAILY  . Multi-Vit w/Min Take 1 tablet by mouth daily.  . tadalafil (CIALIS) 20 MG tablet 1 tablet every day  . testosterone cypio 200 MG/ML inj Inject 2 mLs (400 mg total) into the muscle every 14 (fourteen) days.  Marland Kitchen gabapentin  300 MG capsule Take 1 capsule 3 x / day for Sciatica Neuritis pain   NKDA  Review of Systems  10 point systems review negative except as above.    Objective:   Physical Exam  BP 122/84   Pulse 88   Temp 97.9 F (36.6 C)   Resp 16   Ht 5\' 7"  (1.702 m)   Wt 200 lb 3.2 oz (90.8 kg)   BMI 31.36 kg/m   No stridor or distress.  HEENT - Eac's patent. TM's Nl. EOM's full. PERRLA. NasoOroPharynx clear. Neck - supple. Nl Thyroid. Carotids 2+ & No bruits, nodes, JVD Chest - Clear equal BS w/o Rales, rhonchi, wheezes. Cor - Nl HS. RRR w/o sig murmur. Abd - No palpable organomegaly, masses or tenderness. BS nl.  GU - No appreciated Rt inguinal bulge/protrusion or tenderness at the inguinal ring.  MS- FROM w/o deformities.  (+) tenderness elicited at the insertion of the Rt thigh - vastus medialis adductor tendons  Neuro - Nl w/o focal abnormalities.    Assessment & Plan:    1. Severe rt groin pain  - Recc surgical consult.   2. Mixed hyperlipidemia  - Lipid panel  3. Chronic vasomotor rhinitis  - azelastine (ASTELIN) 0.1 % nasal spray; 2 sprays each nostril 2 x /day  Dispense: 90 mL; Refill: 1 - advised if no improvement, recommend re-evaluation by ENT

## 2016-07-27 NOTE — Patient Instructions (Addendum)
Groin Strain A groin strain (also called a groin pull) is an injury to the muscles or tendon on the upper inner part of the thigh. These muscles are called the adductor muscles or groin muscles. They are responsible for moving the leg across the body. A muscle strain occurs when a muscle is overstretched and some muscle fibers are torn. A groin strain can range from mild to severe depending on how many muscle fibers are affected and whether the muscle fibers are partially or completely torn.  Groin strains usually occur during exercise or participation in sports. The injury often happens when a sudden, violent force is placed on a muscle, stretching the muscle too far. A strain is more likely to occur when your muscles are not warmed up or if you are not properly conditioned. Depending on the severity of the groin strain, recovery time may vary from a few weeks to several weeks. Severe injuries often require 4-6 weeks for recovery. In these cases, complete healing can take 4-5 months.  CAUSES   Stretching the groin muscles too far or too suddenly, often during side-to-side motion with an abrupt change in direction.  Putting repeated stress on the groin muscles over a long period of time.  Performing vigorous activity without properly stretching the groin muscles beforehand. SYMPTOMS   Pain and tenderness in the groin area. This begins as sharp pain and persists as a dull ache.  Popping or snapping feeling when the injury occurs (for severe strains).  Swelling or bruising.  Muscle spasms.  Weakness in the leg.  Stiffness in the groin area with decreased ability to move the affected muscles. DIAGNOSIS  Your caregiver will perform a physical exam to diagnose a groin strain. You will be asked about your symptoms and how the injury occurred. X-rays are sometimes needed to rule out a broken bone or cartilage problems. Your caregiver may order a CT scan or MRI if a complete muscle tear is  suspected. TREATMENT  A groin strain will often heal on its own. Your caregiver may prescribe medicines to help manage pain and swelling (anti-inflammatory medicine). You may be told to use crutches for the first few days to minimize your pain. HOME CARE INSTRUCTIONS   Rest. Do not use the strained muscle if it causes pain.  Put ice on the injured area.  Put ice in a plastic bag.  Place a towel between your skin and the bag.  Leave the ice on for 15-20 minutes, every 2-3 hours. Do this for the first 2 days after the injury.  Only take over-the-counter or prescription medicines as directed by your caregiver.  Wrap the injured area with an elastic bandage as directed by your caregiver.  Keep the injured leg raised (elevated).  Walk, stretch, and perform range-of-motion exercises to improve blood flow to the injured area. Only perform these activities if you can do so without any pain. To prevent muscle strains:  Warm up before exercise.  Develop proper conditioning and strength in the groin muscles. SEEK IMMEDIATE MEDICAL CARE IF:   You have increased pain or swelling in the affected area.   Your symptoms are not improving or are getting worse. MAKE SURE YOU:   Understand these instructions.  Will watch your condition.  Will get help right away if you are not doing well or get worse. This information is not intended to replace advice given to you by your health care provider. Make sure you discuss any questions you have with your   health care provider.

## 2016-07-28 LAB — LIPID PANEL
Cholesterol: 183 mg/dL (ref <200)
HDL: 37 mg/dL — ABNORMAL LOW (ref >40)
Total CHOL/HDL Ratio: 4.9 Ratio (ref <5.0)
Triglycerides: 460 mg/dL — ABNORMAL HIGH (ref <150)

## 2016-09-30 ENCOUNTER — Ambulatory Visit: Payer: Self-pay | Admitting: Internal Medicine

## 2016-10-06 ENCOUNTER — Ambulatory Visit (INDEPENDENT_AMBULATORY_CARE_PROVIDER_SITE_OTHER): Payer: BLUE CROSS/BLUE SHIELD | Admitting: Internal Medicine

## 2016-10-06 ENCOUNTER — Encounter: Payer: Self-pay | Admitting: Internal Medicine

## 2016-10-06 VITALS — BP 124/82 | HR 88 | Temp 97.7°F | Resp 16 | Ht 67.0 in | Wt 199.2 lb

## 2016-10-06 DIAGNOSIS — E349 Endocrine disorder, unspecified: Secondary | ICD-10-CM | POA: Diagnosis not present

## 2016-10-06 DIAGNOSIS — E782 Mixed hyperlipidemia: Secondary | ICD-10-CM | POA: Diagnosis not present

## 2016-10-06 DIAGNOSIS — Z79899 Other long term (current) drug therapy: Secondary | ICD-10-CM | POA: Diagnosis not present

## 2016-10-06 DIAGNOSIS — I10 Essential (primary) hypertension: Secondary | ICD-10-CM | POA: Diagnosis not present

## 2016-10-06 DIAGNOSIS — N182 Chronic kidney disease, stage 2 (mild): Secondary | ICD-10-CM

## 2016-10-06 DIAGNOSIS — E079 Disorder of thyroid, unspecified: Secondary | ICD-10-CM

## 2016-10-06 DIAGNOSIS — E1122 Type 2 diabetes mellitus with diabetic chronic kidney disease: Secondary | ICD-10-CM

## 2016-10-06 DIAGNOSIS — E559 Vitamin D deficiency, unspecified: Secondary | ICD-10-CM | POA: Diagnosis not present

## 2016-10-06 LAB — CBC WITH DIFFERENTIAL/PLATELET
Basophils Absolute: 62 cells/uL (ref 0–200)
Basophils Relative: 1 %
Eosinophils Absolute: 310 cells/uL (ref 15–500)
Eosinophils Relative: 5 %
HCT: 49.7 % (ref 38.5–50.0)
Hemoglobin: 16.6 g/dL (ref 13.2–17.1)
Lymphocytes Relative: 47 %
Lymphs Abs: 2914 cells/uL (ref 850–3900)
MCH: 29.4 pg (ref 27.0–33.0)
MCHC: 33.4 g/dL (ref 32.0–36.0)
MCV: 88.1 fL (ref 80.0–100.0)
MPV: 9.5 fL (ref 7.5–12.5)
Monocytes Absolute: 434 cells/uL (ref 200–950)
Monocytes Relative: 7 %
Neutro Abs: 2480 cells/uL (ref 1500–7800)
Neutrophils Relative %: 40 %
Platelets: 239 10*3/uL (ref 140–400)
RBC: 5.64 MIL/uL (ref 4.20–5.80)
RDW: 14 % (ref 11.0–15.0)
WBC: 6.2 10*3/uL (ref 3.8–10.8)

## 2016-10-06 LAB — LIPID PANEL
Cholesterol: 153 mg/dL (ref <200)
HDL: 41 mg/dL (ref >40)
LDL Cholesterol: 37 mg/dL (ref <100)
Total CHOL/HDL Ratio: 3.7 Ratio (ref <5.0)
Triglycerides: 373 mg/dL — ABNORMAL HIGH (ref <150)
VLDL: 75 mg/dL — ABNORMAL HIGH (ref <30)

## 2016-10-06 LAB — BASIC METABOLIC PANEL WITH GFR
BUN: 9 mg/dL (ref 7–25)
CO2: 23 mmol/L (ref 20–31)
Calcium: 9.2 mg/dL (ref 8.6–10.3)
Chloride: 102 mmol/L (ref 98–110)
Creat: 1.15 mg/dL (ref 0.70–1.33)
GFR, Est African American: 80 mL/min (ref >=60)
GFR, Est Non African American: 69 mL/min (ref >=60)
Glucose, Bld: 240 mg/dL — ABNORMAL HIGH (ref 65–99)
Potassium: 4.2 mmol/L (ref 3.5–5.3)
Sodium: 137 mmol/L (ref 135–146)

## 2016-10-06 LAB — HEPATIC FUNCTION PANEL
ALT: 26 U/L (ref 9–46)
AST: 21 U/L (ref 10–35)
Albumin: 4.1 g/dL (ref 3.6–5.1)
Alkaline Phosphatase: 48 U/L (ref 40–115)
Bilirubin, Direct: 0.1 mg/dL (ref <=0.2)
Indirect Bilirubin: 0.3 mg/dL (ref 0.2–1.2)
Total Bilirubin: 0.4 mg/dL (ref 0.2–1.2)
Total Protein: 6.5 g/dL (ref 6.1–8.1)

## 2016-10-06 NOTE — Progress Notes (Signed)
This very nice 61 y.o. MWM presents for 3 month follow up with Hypertension, Hyperlipidemia, Pre-Diabetes and Vitamin D Deficiency. Today he also c/o occasional sharp pains to the neck with lateral rotation/bending.     Patient is treated for HTN (2008) & BP has been controlled at home. Today's BP is at goal - 124/82. Patient had a negative heart cath in 2011.  Patient has had no complaints of any cardiac type chest pain, palpitations, dyspnea/orthopnea/PND, dizziness, claudication, or dependent edema.     Hyperlipidemia is controlled with diet & meds. Patient denies myalgias or other med SE's. Last Lipids were at goal with Chol dropping from 303 (off meds) to 183 (back on meds) albeit elevated Trig's: Lab Results  Component Value Date   CHOL 183 07/27/2016   HDL 37 (L) 07/27/2016   LDLCALC NOT CALC 07/27/2016   TRIG 460 (H) 07/27/2016   CHOLHDL 4.9 07/27/2016      Also, the patient has history of T2_NIDDM (2014) and has had no symptoms of reactive hypoglycemia, diabetic polys, paresthesias or visual blurring.  Last A1c was not at goal: Lab Results  Component Value Date   HGBA1C 7.6 (H) 06/17/2016      Further, the patient also has history of Vitamin D Deficiency ("27" in 2008) and supplements vitamin D without any suspected side-effects. Last vitamin D was  Low at "11" in Feb 2017  Current Outpatient Prescriptions on File Prior to Visit  Medication Sig  . aspirin 81 MG tablet Take 81 mg by mouth daily.  Marland Kitchen buPROPion-SR) 150 MG Take 1 tab 2 (two) times daily.  Marland Kitchen VITAMIN D 5000 UNITS CAPS Take 1 caps daily.  Marland Kitchen esomeprazole  40 MG cap Take 2 x daily.  . fish oil-omega-3  1000 MG  Take 1 g by mouth 2 (two) times daily.   Marland Kitchen FLONASE nasal spray 2 sprays into  nostrils daily.  . ATROVENT 0.06 % nasal spray 2 sprays each nostril 2 to 3 x/daily  . ketoconazole  2 % crm Apply 2 x / day to tinea versicolor rash  . levothyroxine 150 MCG tablet Take  daily.  Marland Kitchen CLARITIN-D 12-HR 5-120 MG  Take  1 tab 2 xdaily.   . metFORMIN-XR 500 MG  TAKE 4 tabs /daily  . (MULTI-VIT w/Min Take 1 tab daily.  . tadalafil 20 MG tablet 1 tablet every day  . DEPO-TESTOSTERONE 200 MG injec Inj 2 mLs  IM every 14  days.  Marland Kitchen gabapentin  300 MG capsule Take 1 cap 3 x / day    PMHx:   Past Medical History:  Diagnosis Date  . Arthritis   . Complication of anesthesia    Pt states that he experienced some numbness on right side of tongue after surgery for 2 months; continues to experience gum sensitivity to date  . Depression   . Diabetes mellitus without complication ([place])   . DJD (degenerative joint disease)   . GERD (gastroesophageal reflux disease)   . Headache(784.0)    Sinus headaches  . Hyperlipidemia   . Hypertension   . Hypothyroidism   . Other testicular hypofunction   . Prediabetes   . Sleep apnea 2006   uses a cpap -desated to 55% during study, uses CPAP nightly  . Thyroid disease   . Tinnitus of both ears    Hx: of  . Vitamin D deficiency    Immunization History  Administered Date(s) Administered  . Influenza Split 04/18/2013, 05/05/2016  .  Influenza, Seasonal, Injecte, Preservative Fre 06/16/2015  . Pneumococcal Polysaccharide-23 04/12/2011  . Tdap 04/12/2011   Past Surgical History:  Procedure Laterality Date  . CARDIAC CATHETERIZATION  03/30/2010   normal coronaries, RCA takeoff of the left coronary cusp, EF 55% [facility])  . CARPAL TUNNEL RELEASE Right 01/12/2016   Procedure: CARPAL TUNNEL RELEASE;  Surgeon: Tania Ade, MD;  Location: [place];  Service: Orthopedics;  Laterality: Right;  Right carpal tunnel release  . COLONOSCOPY     x2  . KNEE ARTHROPLASTY Left 10/16/2012   Procedure: LEFT COMPUTER ASSISTED TOTAL KNEE ARTHROPLASTY;  Surgeon: Alta Corning, MD;  Location: [place];  Service: Orthopedics;  Laterality: Left;  . KNEE ARTHROSCOPY  05/12/2012   Procedure: ARTHROSCOPY KNEE;  Surgeon: Alta Corning, MD;  Location: [place];   Service: Orthopedics;  Laterality: Left;  . KNEE SURGERY    . MOUTH SURGERY    . TOTAL KNEE ARTHROPLASTY Left 10/16/2012   Dr Berenice Primas  . UPPER GI ENDOSCOPY     x2   FHx:    Reviewed / unchanged  SHx:    Reviewed / unchanged  Systems Review:  Constitutional: Denies fever, chills, wt changes, headaches, insomnia, fatigue, night sweats, change in appetite. Eyes: Denies redness, blurred vision, diplopia, discharge, itchy, watery eyes.  ENT: Denies discharge, congestion, post nasal drip, epistaxis, sore throat, earache, hearing loss, dental pain, tinnitus, vertigo, sinus pain, snoring.  CV: Denies chest pain, palpitations, irregular heartbeat, syncope, dyspnea, diaphoresis, orthopnea, PND, claudication or edema. Respiratory: denies cough, dyspnea, DOE, pleurisy, hoarseness, laryngitis, wheezing.  Gastrointestinal: Denies dysphagia, odynophagia, heartburn, reflux, water brash, abdominal pain or cramps, nausea, vomiting, bloating, diarrhea, constipation, hematemesis, melena, hematochezia  or hemorrhoids. Genitourinary: Denies dysuria, frequency, urgency, nocturia, hesitancy, discharge, hematuria or flank pain. Musculoskeletal: Denies arthralgias, myalgias, stiffness, jt. swelling, pain, limping or strain/sprain.  Skin: Denies pruritus, rash, hives, warts, acne, eczema or change in skin lesion(s). Neuro: No weakness, tremor, incoordination, spasms, paresthesia or pain. Psychiatric: Denies confusion, memory loss or sensory loss. Endo: Denies change in weight, skin or hair change.  Heme/Lymph: No excessive bleeding, bruising or enlarged lymph nodes.  Physical Exam  BP 124/82   Pulse 88   Temp 97.7 F (36.5 C)   Resp 16   Ht 5\' 7"  (1.702 m)   Wt 199 lb 3.2 oz (90.4 kg)   BMI 31.20 kg/m   Appears over nourished, well groomed  and in no distress.  Eyes: PERRLA, EOMs, conjunctiva no swelling or erythema. Sinuses: No frontal/maxillary tenderness ENT/Mouth: EAC's clear, TM's nl w/o  erythema, bulging. Nares clear w/o erythema, swelling, exudates. Oropharynx clear without erythema or exudates. Oral hygiene is good. Tongue normal, non obstructing. Hearing intact.  Neck: Supple. Thyroid nl. Car 2+/2+ without bruits, nodes or JVD. Chest: Respirations nl with BS clear & equal w/o rales, rhonchi, wheezing or stridor.  Cor: Heart sounds normal w/ regular rate and rhythm without sig. murmurs, gallops, clicks or rubs. Peripheral pulses normal and equal  without edema.  Abdomen: Soft & bowel sounds normal. Non-tender w/o guarding, rebound, hernias, masses or organomegaly.  Lymphatics: Unremarkable.  Musculoskeletal: Full ROM all peripheral extremities, joint stability, 5/5 strength and normal gait.  Skin: Warm, dry without exposed rashes, lesions or ecchymosis apparent.  Neuro: Cranial nerves intact, reflexes equal bilaterally. Sensory-motor testing grossly intact. Tendon reflexes grossly intact.  Pysch: Alert & oriented x 3.  Insight and judgement nl & appropriate. No ideations.  Assessment and Plan:  1.  Essential hypertension  - Continue medication, monitor blood pressure at home.  - Continue DASH diet. Reminder to go to the ER if any CP,  SOB, nausea, dizziness, severe HA, changes vision/speech,  left arm numbness and tingling and jaw pain.  - CBC with Differential/Platelet - BASIC METABOLIC PANEL WITH GFR - Magnesium  2. Mixed hyperlipidemia  - Continue diet/meds, exercise,& lifestyle modifications.  - Continue monitor periodic cholesterol/liver & renal functions  - Hepatic function panel - Lipid panel - TSH  3. T2_NIDDM w/ CKD2  ([place])  - strongly encouraged weight loss.   - Hemoglobin A1c - Insulin, random  4. Vitamin D deficiency  - Continue diet, exercise, lifestyle modifications.  - Monitor appropriate labs. - Continue supplementation. - VITAMIN D 25 Hydroxyl  5. Hypothyroidism  - TSH  6. Testosterone deficiency  - Testosterone  7. Medication  management  - CBC with Differential/Platelet - BASIC METABOLIC PANEL WITH GFR - Hepatic function panel - Magnesium - Lipid panel - TSH - Hemoglobin A1c - Insulin, random - VITAMIN D 25 Hydroxy - Testosterone       Discussed  regular exercise, BP monitoring, weight control to achieve/maintain BMI less than 25 and discussed med and SE's. Recommended labs to assess and monitor clinical status with further disposition pending results of labs. Over 30 minutes of exam, counseling, chart review was performed.

## 2016-10-06 NOTE — Patient Instructions (Addendum)
Testosterone injection What is this medicine? TESTOSTERONE (tes TOS ter one) is the main male hormone. It supports normal male development such as muscle growth, facial hair, and deep voice. It is used in males to treat low testosterone levels. This medicine may be used for other purposes; ask your health care provider or pharmacist if you have questions. COMMON BRAND NAME(S): Andro-L.A., Aveed, Delatestryl, Depo-Testosterone, Virilon What should I tell my health care provider before I take this medicine? They need to know if you have any of these conditions: -cancer -diabetes -heart disease -kidney disease -liver disease -lung disease -prostate disease -an unusual or allergic reaction to testosterone, other medicines, foods, dyes, or preservatives  How should I use this medicine? This medicine is for injection into a muscle. It is usually given by a health care professional in a hospital or clinic setting. Contact your pediatrician regarding the use of this medicine in children. While this medicine may be prescribed for children as young as 57 years of age for selected conditions, precautions do apply.  NOTE: This medicine is only for you. Do not share this medicine with others. What if I miss a dose? Try not to miss a dose. Your doctor or health care professional will tell you when your next injection is due. Notify the office if you are unable to keep an appointment. What may interact with this medicine? -medicines for diabetes -medicines that treat or prevent blood clots like warfarin -oxyphenbutazone -propranolol -steroid medicines like prednisone or cortisone This list may not describe all possible interactions. Give your health care provider a list of all the medicines, herbs, non-prescription drugs, or dietary supplements you use. Also tell them if you smoke, drink alcohol, or use illegal drugs. Some items may interact with your medicine. What should I watch for while using this  medicine? Visit your doctor or health care professional for regular checks on your progress. They will need to check the level of testosterone in your blood. This medicine is only approved for use in men who have low levels of testosterone related to certain medical conditions. Heart attacks and strokes have been reported with the use of this medicine. Notify your doctor or health care professional and seek emergency treatment if you develop breathing problems; changes in vision; confusion; chest pain or chest tightness; sudden arm pain; severe, sudden headache; trouble speaking or understanding; sudden numbness or weakness of the face, arm or leg; loss of balance or coordination. Talk to your doctor about the risks and benefits of this medicine. This medicine may affect blood sugar levels. If you have diabetes, check with your doctor or health care professional before you change your diet or the dose of your diabetic medicine. Testosterone injections are not commonly used in women. Women should inform their doctor if they wish to become pregnant or think they might be pregnant. There is a potential for serious side effects to an unborn child. Talk to your health care professional or pharmacist for more information. Talk with your doctor or health care professional about your birth control options while taking this medicine. This drug is banned from use in athletes by most athletic organizations. What side effects may I notice from receiving this medicine? Side effects that you should report to your doctor or health care professional as soon as possible: -allergic reactions like skin rash, itching or hives, swelling of the face, lips, or tongue -breast enlargement -breathing problems -changes in emotions or moods -deep or hoarse voice -irregular menstrual periods -signs  and symptoms of liver injury like dark yellow or brown urine; general ill feeling or flu-like symptoms; light-colored stools; loss of  appetite; nausea; right upper belly pain; unusually weak or tired; yellowing of the eyes or skin -stomach pain -swelling of the ankles, feet, hands -too frequent or persistent erections -trouble passing urine or change in the amount of urine Side effects that usually do not require medical attention (report to your doctor or health care professional if they continue or are bothersome): -acne -change in sex drive or performance -facial hair growth -hair loss -headache This list may not describe all possible side effects. Call your doctor for medical advice about side effects. You may report side effects to FDA at 1-800-FDA-1088. Where should I keep my medicine? Keep out of the reach of children. This medicine can be abused. Keep your medicine in a safe place to protect it from theft. Do not share this medicine with anyone. Selling or giving away this medicine is dangerous and against the law. Store at room temperature between 20 and 25 degrees C (68 and 77 degrees F). Do not freeze. Protect from light. Follow the directions for the product you are prescribed. Throw away any unused medicine after the expiration date. NOTE: This sheet is a summary. It may not cover all possible information. If you have questions about this medicine, talk to your doctor, pharmacist, or health care provider.  2018 Elsevier/Gold Standard (2015-07-26 07:33:55)  ++++++++++++++++++++++++++ Recommend Adult Low Dose Aspirin or  coated  Aspirin 81 mg daily  To reduce risk of Colon Cancer 20 %,  Skin Cancer 26 % ,  Melanoma 46%  and  Pancreatic cancer 60% +++++++++++++++++++++++++ Vitamin D goal  is between 70-100.  Please make sure that you are taking your Vitamin D as directed.  It is very important as a natural anti-inflammatory  helping hair, skin, and nails, as well as reducing stroke and heart attack risk.  It helps your bones and helps with mood. It also decreases numerous cancer risks so please take it  as directed.  Low Vit D is associated with a 200-300% higher risk for CANCER  and 200-300% higher risk for HEART   ATTACK  &  STROKE.   .....................................Marland Kitchen It is also associated with higher death rate at younger ages,  autoimmune diseases like Rheumatoid arthritis, Lupus, Multiple Sclerosis.    Also many other serious conditions, like depression, Alzheimer's Dementia, infertility, muscle aches, fatigue, fibromyalgia - just to name a few. ++++++++++++++++++++ Recommend the book "The END of DIETING" by Dr Excell Seltzer  & the book "The END of DIABETES " by Dr Excell Seltzer At [email or web address] - get book & Audio CD's    Being diabetic has a  300% increased risk for heart attack, stroke, cancer, and alzheimer- type vascular dementia. It is very important that you work harder with diet by avoiding all foods that are white. Avoid white rice (brown & wild rice is OK), white potatoes (sweetpotatoes in moderation is OK), White bread or wheat bread or anything made out of white flour like bagels, donuts, rolls, buns, biscuits, cakes, pastries, cookies, pizza crust, and pasta (made from white flour & egg whites) - vegetarian pasta or spinach or wheat pasta is OK. Multigrain breads like Arnold's or Pepperidge Farm, or multigrain sandwich thins or flatbreads.  Diet, exercise and weight loss can reverse and cure diabetes in the early stages.  Diet, exercise and weight loss is very important in the control and prevention of complications of  diabetes which affects every system in your body, ie. Brain - dementia/stroke, eyes - glaucoma/blindness, heart - heart attack/heart failure, kidneys - dialysis, stomach - gastric paralysis, intestines - malabsorption, nerves - severe painful neuritis, circulation - gangrene & loss of a leg(s), and finally cancer and Alzheimers.    I recommend avoid fried & greasy foods,  sweets/candy, white rice (brown or wild rice or Quinoa is OK), white potatoes (sweet potatoes are  OK) - anything made from white flour - bagels, doughnuts, rolls, buns, biscuits,white and wheat breads, pizza crust and traditional pasta made of white flour & egg white(vegetarian pasta or spinach or wheat pasta is OK).  Multi-grain bread is OK - like multi-grain flat bread or sandwich thins. Avoid alcohol in excess. Exercise is also important.    Eat all the vegetables you want - avoid meat, especially red meat and dairy - especially cheese.  Cheese is the most concentrated form of trans-fats which is the worst thing to clog up our arteries. Veggie cheese is OK which can be found in the fresh produce section at [employer] or [employer] or [employer]  +++++++++++++++++++++ DASH Eating Plan  DASH stands for "Dietary Approaches to Stop Hypertension."   The DASH eating plan is a healthy eating plan that has been shown to reduce high blood pressure (hypertension). Additional health benefits may include reducing the risk of type 2 diabetes mellitus, heart disease, and stroke. The DASH eating plan may also help with weight loss. WHAT DO I NEED TO KNOW ABOUT THE DASH EATING PLAN? For the DASH eating plan, you will follow these general guidelines:  Choose foods with a percent daily value for sodium of less than 5% (as listed on the food label).  Use salt-free seasonings or herbs instead of table salt or sea salt.  Check with your health care provider or pharmacist before using salt substitutes.  Eat lower-sodium products, often labeled as "lower sodium" or "no salt added."  Eat fresh foods.  Eat more vegetables, fruits, and low-fat dairy products.  Choose whole grains. Look for the word "whole" as the first word in the ingredient list.  Choose fish   Limit sweets, desserts, sugars, and sugary drinks.  Choose heart-healthy fats.  Eat veggie cheese   Eat more home-cooked food and less restaurant, buffet, and fast food.  Limit fried foods.  Cook foods using methods other than  frying.  Limit canned vegetables. If you do use them, rinse them well to decrease the sodium.  When eating at a restaurant, ask that your food be prepared with less salt, or no salt if possible.                      WHAT FOODS CAN I EAT? Read Dr Fara Olden Fuhrman's books on The End of Dieting & The End of Diabetes  Grains Whole grain or whole wheat bread. Brown rice. Whole grain or whole wheat pasta. Quinoa, bulgur, and whole grain cereals. Low-sodium cereals. Corn or whole wheat flour tortillas. Whole grain cornbread. Whole grain crackers. Low-sodium crackers.  Vegetables Fresh or frozen vegetables (raw, steamed, roasted, or grilled). Low-sodium or reduced-sodium tomato and vegetable juices. Low-sodium or reduced-sodium tomato sauce and paste. Low-sodium or reduced-sodium canned vegetables.   Fruits All fresh, canned (in natural juice), or frozen fruits.  Protein Products  All fish and seafood.  Dried beans, peas, or lentils. Unsalted nuts and seeds. Unsalted canned beans.  Dairy Low-fat dairy products, such as skim or 1% milk,  2% or reduced-fat cheeses, low-fat ricotta or cottage cheese, or plain low-fat yogurt. Low-sodium or reduced-sodium cheeses.  Fats and Oils Tub margarines without trans fats. Light or reduced-fat mayonnaise and salad dressings (reduced sodium). Avocado. Safflower, olive, or canola oils. Natural peanut or almond butter.  Other Unsalted popcorn and pretzels. The items listed above may not be a complete list of recommended foods or beverages. Contact your dietitian for more options.  +++++++++++++++  WHAT FOODS ARE NOT RECOMMENDED? Grains/ White flour or wheat flour White bread. White pasta. White rice. Refined cornbread. Bagels and croissants. Crackers that contain trans fat.  Vegetables  Creamed or fried vegetables. Vegetables in a . Regular canned vegetables. Regular canned tomato sauce and paste. Regular tomato and vegetable juices.  Fruits Dried fruits.  Canned fruit in light or heavy syrup. Fruit juice.  Meat and Other Protein Products Meat in general - RED meat & White meat.  Fatty cuts of meat. Ribs, chicken wings, all processed meats as bacon, sausage, bologna, salami, fatback, hot dogs, bratwurst and packaged luncheon meats.  Dairy Whole or 2% milk, cream, half-and-half, and cream cheese. Whole-fat or sweetened yogurt. Full-fat cheeses or blue cheese. Non-dairy creamers and whipped toppings. Processed cheese, cheese spreads, or cheese curds.  Condiments Onion and garlic salt, seasoned salt, table salt, and sea salt. Canned and packaged gravies. Worcestershire sauce. Tartar sauce. Barbecue sauce. Teriyaki sauce. Soy sauce, including reduced sodium. Steak sauce. Fish sauce. Oyster sauce. Cocktail sauce. Horseradish. Ketchup and mustard. Meat flavorings and tenderizers. Bouillon cubes. Hot sauce. Tabasco sauce. Marinades. Taco seasonings. Relishes.  Fats and Oils Butter, stick margarine, lard, shortening and bacon fat. Coconut, palm kernel, or palm oils. Regular salad dressings.  Pickles and olives. Salted popcorn and pretzels.  The items listed above may not be a complete list of foods and beverages to avoid.

## 2016-10-07 LAB — VITAMIN D 25 HYDROXY (VIT D DEFICIENCY, FRACTURES): Vit D, 25-Hydroxy: 61 ng/mL (ref 30–100)

## 2016-10-07 LAB — HEMOGLOBIN A1C
Hgb A1c MFr Bld: 7.6 % — ABNORMAL HIGH (ref <5.7)
Mean Plasma Glucose: 171 mg/dL

## 2016-10-07 LAB — MAGNESIUM: Magnesium: 1.9 mg/dL (ref 1.5–2.5)

## 2016-10-07 LAB — TSH: TSH: 0.37 mIU/L — ABNORMAL LOW (ref 0.40–4.50)

## 2016-10-07 LAB — TESTOSTERONE: Testosterone: 1146 ng/dL — ABNORMAL HIGH (ref 250–827)

## 2016-10-07 LAB — INSULIN, RANDOM: Insulin: 15.6 u[IU]/mL (ref 2.0–19.6)

## 2017-01-13 ENCOUNTER — Ambulatory Visit (INDEPENDENT_AMBULATORY_CARE_PROVIDER_SITE_OTHER): Payer: BLUE CROSS/BLUE SHIELD | Admitting: Internal Medicine

## 2017-01-13 VITALS — BP 122/80 | HR 84 | Temp 97.6°F | Resp 16 | Ht 67.0 in | Wt 201.6 lb

## 2017-01-13 DIAGNOSIS — E1122 Type 2 diabetes mellitus with diabetic chronic kidney disease: Secondary | ICD-10-CM

## 2017-01-13 DIAGNOSIS — E782 Mixed hyperlipidemia: Secondary | ICD-10-CM | POA: Diagnosis not present

## 2017-01-13 DIAGNOSIS — E349 Endocrine disorder, unspecified: Secondary | ICD-10-CM

## 2017-01-13 DIAGNOSIS — Z79899 Other long term (current) drug therapy: Secondary | ICD-10-CM

## 2017-01-13 DIAGNOSIS — E559 Vitamin D deficiency, unspecified: Secondary | ICD-10-CM | POA: Diagnosis not present

## 2017-01-13 DIAGNOSIS — N182 Chronic kidney disease, stage 2 (mild): Secondary | ICD-10-CM | POA: Diagnosis not present

## 2017-01-13 DIAGNOSIS — I10 Essential (primary) hypertension: Secondary | ICD-10-CM | POA: Diagnosis not present

## 2017-01-13 DIAGNOSIS — E079 Disorder of thyroid, unspecified: Secondary | ICD-10-CM | POA: Diagnosis not present

## 2017-01-13 NOTE — Patient Instructions (Signed)

## 2017-01-13 NOTE — Progress Notes (Signed)
This very nice 61 y.o. MWM presents for 6 month follow up with Hypertension, Hyperlipidemia, Pre-Diabetes and Vitamin D Deficiency.      Patient is treated for HTN circa 2008 & BP has been controlled at home. Today's BP is at goal - 122/80.  Patient has a false (+) Myoview in 2011 and a negative Cardiac cath. Patient has had no complaints of any cardiac type chest pain, palpitations, dyspnea/orthopnea/PND, dizziness, claudication, or dependent edema.     Hyperlipidemia is controlled with diet & meds. Patient denies myalgias or other med SE's. Last Lipids were  Lab Results  Component Value Date   CHOL 152 01/13/2017   HDL 39 (L) 01/13/2017   LDLCALC NOT CALC 01/13/2017   TRIG 525 (H) 01/13/2017   CHOLHDL 3.9 01/13/2017      Also, the patient has history of Morbid Obesity (BMI 31+) and consequent T2_NIDDM circa 2014 and has had no symptoms of reactive hypoglycemia, diabetic polys, paresthesias or visual blurring.  Patient admittedly is not compliant with diet and as expected last A1c was not at goal:   Lab Results  Component Value Date   HGBA1C 8.8 (H) 01/13/2017      Further, the patient also has history of Vitamin D Deficiency ("27" in 2008)  and supplements vitamin D without any suspected side-effects. Last vitamin D was near goal (goal 70-100):  Lab Results  Component Value Date   VD25OH 54 01/13/2017   Current Outpatient Prescriptions on File Prior to Visit  Medication Sig  . aspirin 81 MG tablet Take 81 mg by mouth daily.  Marland Kitchen azelastine (ASTELIN) 0.1 % nasal spray 2 sprays each nostril 2 x /day  . buPROPion (WELLBUTRIN SR) 150 MG 12 hr tablet Take 1 tablet (150 mg total) by mouth 2 (two) times daily.  Marland Kitchen esomeprazole (NEXIUM) 40 MG capsule Take 40 mg by mouth 2 (two) times daily.  Marland Kitchen ezetimibe-simvastatin (VYTORIN) 10-40 MG tablet Take 1 tablet by mouth daily.  . fish oil-omega-3 fatty acids 1000 MG capsule Take 1 g by mouth 2 (two) times daily.   . fluticasone (FLONASE) 50  MCG/ACT nasal spray Place 2 sprays into both nostrils daily.  Marland Kitchen levothyroxine (SYNTHROID, LEVOTHROID) 150 MCG tablet Take 150 mcg by mouth daily.  Marland Kitchen loratadine-pseudoephedrine (CLARITIN-D 12-HOUR) 5-120 MG per tablet Take 1 tablet by mouth 2 (two) times daily.   . metFORMIN (GLUCOPHAGE-XR) 500 MG 24 hr tablet TAKE 1 TABLET TWICE DAILY WITH BREAKFAST AND LUNCH, AND 2 TABLETS WITH SUPPER  . testosterone cypionate (DEPO-TESTOSTERONE) 200 MG/ML injection Inject 2 mLs (400 mg total) into the muscle every 14 (fourteen) days.  . tadalafil (CIALIS) 20 MG tablet 1 tablet every day   No current facility-administered medications on file prior to visit.    No Known Allergies   PMHx:   Past Medical History:  Diagnosis Date  . Arthritis   . Complication of anesthesia    Pt states that he experienced some numbness on right side of tongue after surgery for 2 months; continues to experience gum sensitivity to date  . Depression   . Diabetes mellitus without complication ([place])   . DJD (degenerative joint disease)   . GERD (gastroesophageal reflux disease)   . Headache(784.0)    Sinus headaches  . Hyperlipidemia   . Hypertension   . Hypothyroidism   . Other testicular hypofunction   . Prediabetes   . Sleep apnea 2006   uses a cpap -desated to 55% during study, uses  CPAP nightly  . Thyroid disease   . Tinnitus of both ears    Hx: of  . Vitamin D deficiency    Immunization History  Administered Date(s) Administered  . Influenza Split 04/18/2013, 05/05/2016  . Influenza, Seasonal, Injecte, Preservative Fre 06/16/2015  . Pneumococcal Polysaccharide-23 04/12/2011  . Tdap 04/12/2011   Past Surgical History:  Procedure Laterality Date  . CARDIAC CATHETERIZATION  03/30/2010   normal coronaries, RCA takeoff of the left coronary cusp, EF 55% [facility])  . CARPAL TUNNEL RELEASE Right 01/12/2016   Procedure: CARPAL TUNNEL RELEASE;  Surgeon: Tania Ade, MD;  Location: [facility];  Service: Orthopedics;  Laterality: Right;  Right carpal tunnel release  . COLONOSCOPY     x2  . KNEE ARTHROPLASTY Left 10/16/2012   Procedure: LEFT COMPUTER ASSISTED TOTAL KNEE ARTHROPLASTY;  Surgeon: Alta Corning, MD;  Location: [place];  Service: Orthopedics;  Laterality: Left;  . KNEE ARTHROSCOPY  05/12/2012   Procedure: ARTHROSCOPY KNEE;  Surgeon: Alta Corning, MD;  Location: [place];  Service: Orthopedics;  Laterality: Left;  . KNEE SURGERY    . MOUTH SURGERY    . TOTAL KNEE ARTHROPLASTY Left 10/16/2012   Dr Berenice Primas  . UPPER GI ENDOSCOPY     x2   FHx:    Reviewed / unchanged  SHx:    Reviewed / unchanged  Systems Review:  Constitutional: Denies fever, chills, wt changes, headaches, insomnia, fatigue, night sweats, change in appetite. Eyes: Denies redness, blurred vision, diplopia, discharge, itchy, watery eyes.  ENT: Denies discharge, congestion, post nasal drip, epistaxis, sore throat, earache, hearing loss, dental pain, tinnitus, vertigo, sinus pain, snoring.  CV: Denies chest pain, palpitations, irregular heartbeat, syncope, dyspnea, diaphoresis, orthopnea, PND, claudication or edema. Respiratory: denies cough, dyspnea, DOE, pleurisy, hoarseness, laryngitis, wheezing.  Gastrointestinal: Denies dysphagia, odynophagia, heartburn, reflux, water brash, abdominal pain or cramps, nausea, vomiting, bloating, diarrhea, constipation, hematemesis, melena, hematochezia  or hemorrhoids. Genitourinary: Denies dysuria, frequency, urgency, nocturia, hesitancy, discharge, hematuria or flank pain. Musculoskeletal: Denies arthralgias, myalgias, stiffness, jt. swelling, pain, limping or strain/sprain.  Skin: Denies pruritus, rash, hives, warts, acne, eczema or change in skin lesion(s). Neuro: No weakness, tremor, incoordination, spasms, paresthesia or pain. Psychiatric: Denies confusion, memory loss or sensory loss. Endo: Denies change in weight, skin or hair change.    Heme/Lymph: No excessive bleeding, bruising or enlarged lymph nodes.  Physical Exam  BP 122/80   Pulse 84   Temp 97.6 F (36.4 C)   Resp 16   Ht 5\' 7"  (1.702 m)   Wt 201 lb 9.6 oz (91.4 kg)   BMI 31.58 kg/m   Appears obver nourished, well groomed  and in no distress.  Eyes: PERRLA, EOMs, conjunctiva no swelling or erythema. Sinuses: No frontal/maxillary tenderness ENT/Mouth: EAC's clear, TM's nl w/o erythema, bulging. Nares clear w/o erythema, swelling, exudates. Oropharynx clear without erythema or exudates. Oral hygiene is good. Tongue normal, non obstructing. Hearing intact.  Neck: Supple. Thyroid nl. Car 2+/2+ without bruits, nodes or JVD. Chest: Respirations nl with BS clear & equal w/o rales, rhonchi, wheezing or stridor.  Cor: Heart sounds normal w/ regular rate and rhythm without sig. murmurs, gallops, clicks or rubs. Peripheral pulses normal and equal  without edema.  Abdomen: Soft & bowel sounds normal. Non-tender w/o guarding, rebound, hernias, masses or organomegaly.  Lymphatics: Unremarkable.  Musculoskeletal: Full ROM all peripheral extremities, joint stability, 5/5 strength and normal gait.  Skin: Warm, dry without exposed rashes, lesions  or ecchymosis apparent.  Neuro: Cranial nerves intact, reflexes equal bilaterally. Sensory-motor testing grossly intact. Tendon reflexes grossly intact.  Pysch: Alert & oriented x 3.  Insight and judgement nl & appropriate. No ideations.  Assessment and Plan:  1. Essential hypertension  - Continue medication, monitor blood pressure at home.  - Continue DASH diet. Reminder to go to the ER if any CP,  SOB, nausea, dizziness, severe HA, changes vision/speech.  - CBC with Differential/Platelet - BASIC METABOLIC PANEL WITH GFR - Magnesium - TSH  2. Hyperlipidemia, mixed  - Continue diet/meds, exercise,& lifestyle modifications.  - Continue monitor periodic cholesterol/liver & renal functions   - Hepatic function panel -  Lipid panel - TSH  3. T2_DIDDM w/ CKD 2    (HCC)  - Continue diet, exercise, lifestyle modifications.  - Monitor appropriate labs.  - Hemoglobin A1c - Insulin, random  4. Vitamin D deficiency  - Continue supplementation.  - VITAMIN D 25 Hydroxy   5. Hypothyroidism  - TSH  6. Testosterone deficiency  - Testosterone  7. Medication management  - CBC with Differential/Platelet - BASIC METABOLIC PANEL WITH GFR - Hepatic function panel - Magnesium - Lipid panel - TSH - Hemoglobin A1c - Insulin, random - VITAMIN D 25 Hydroxy      Discussed  regular exercise, BP monitoring, weight control to achieve/maintain BMI less than 25 and discussed med and SE's. Recommended labs to assess and monitor clinical status with further disposition pending results of labs. Over 30 minutes of exam, counseling, chart review was performed.

## 2017-01-14 LAB — CBC WITH DIFFERENTIAL/PLATELET
Basophils Absolute: 66 cells/uL (ref 0–200)
Basophils Relative: 1 %
Eosinophils Absolute: 330 cells/uL (ref 15–500)
Eosinophils Relative: 5 %
HCT: 49.4 % (ref 38.5–50.0)
Hemoglobin: 16.4 g/dL (ref 13.2–17.1)
Lymphocytes Relative: 48 %
Lymphs Abs: 3168 cells/uL (ref 850–3900)
MCH: 29.2 pg (ref 27.0–33.0)
MCHC: 33.2 g/dL (ref 32.0–36.0)
MCV: 87.9 fL (ref 80.0–100.0)
MPV: 9.2 fL (ref 7.5–12.5)
Monocytes Absolute: 528 cells/uL (ref 200–950)
Monocytes Relative: 8 %
Neutro Abs: 2508 cells/uL (ref 1500–7800)
Neutrophils Relative %: 38 %
Platelets: 269 10*3/uL (ref 140–400)
RBC: 5.62 MIL/uL (ref 4.20–5.80)
RDW: 14.3 % (ref 11.0–15.0)
WBC: 6.6 10*3/uL (ref 3.8–10.8)

## 2017-01-14 LAB — LIPID PANEL
Cholesterol: 152 mg/dL (ref <200)
HDL: 39 mg/dL — ABNORMAL LOW (ref >40)
Total CHOL/HDL Ratio: 3.9 Ratio (ref <5.0)
Triglycerides: 525 mg/dL — ABNORMAL HIGH (ref <150)

## 2017-01-14 LAB — HEPATIC FUNCTION PANEL
ALT: 27 U/L (ref 9–46)
AST: 20 U/L (ref 10–35)
Albumin: 4.1 g/dL (ref 3.6–5.1)
Alkaline Phosphatase: 58 U/L (ref 40–115)
Bilirubin, Direct: 0.1 mg/dL (ref <=0.2)
Indirect Bilirubin: 0.3 mg/dL (ref 0.2–1.2)
Total Bilirubin: 0.4 mg/dL (ref 0.2–1.2)
Total Protein: 6.6 g/dL (ref 6.1–8.1)

## 2017-01-14 LAB — BASIC METABOLIC PANEL WITH GFR
BUN: 11 mg/dL (ref 7–25)
CO2: 20 mmol/L (ref 20–31)
Calcium: 9.4 mg/dL (ref 8.6–10.3)
Chloride: 98 mmol/L (ref 98–110)
Creat: 1.1 mg/dL (ref 0.70–1.33)
GFR, Est African American: 84 mL/min (ref >=60)
GFR, Est Non African American: 73 mL/min (ref >=60)
Glucose, Bld: 272 mg/dL — ABNORMAL HIGH (ref 65–99)
Potassium: 4 mmol/L (ref 3.5–5.3)
Sodium: 135 mmol/L (ref 135–146)

## 2017-01-14 LAB — TSH: TSH: 0.48 mIU/L (ref 0.40–4.50)

## 2017-01-14 LAB — MAGNESIUM: Magnesium: 1.8 mg/dL (ref 1.5–2.5)

## 2017-01-14 LAB — HEMOGLOBIN A1C
Hgb A1c MFr Bld: 8.8 % — ABNORMAL HIGH (ref <5.7)
Mean Plasma Glucose: 206 mg/dL

## 2017-01-14 LAB — VITAMIN D 25 HYDROXY (VIT D DEFICIENCY, FRACTURES): Vit D, 25-Hydroxy: 54 ng/mL (ref 30–100)

## 2017-01-14 LAB — TESTOSTERONE: Testosterone: 559 ng/dL (ref 250–827)

## 2017-01-14 LAB — INSULIN, RANDOM: Insulin: 20.9 u[IU]/mL — ABNORMAL HIGH (ref 2.0–19.6)

## 2017-01-15 ENCOUNTER — Encounter: Payer: Self-pay | Admitting: Internal Medicine

## 2017-01-19 ENCOUNTER — Encounter: Payer: Self-pay | Admitting: Internal Medicine

## 2017-01-19 ENCOUNTER — Other Ambulatory Visit: Payer: Self-pay | Admitting: *Deleted

## 2017-01-25 ENCOUNTER — Encounter: Payer: Self-pay | Admitting: Internal Medicine

## 2017-04-18 ENCOUNTER — Other Ambulatory Visit: Payer: Self-pay | Admitting: Internal Medicine

## 2017-04-18 ENCOUNTER — Encounter: Payer: Self-pay | Admitting: Internal Medicine

## 2017-04-18 DIAGNOSIS — E349 Endocrine disorder, unspecified: Secondary | ICD-10-CM

## 2017-04-18 MED ORDER — TADALAFIL 20 MG PO TABS: ORAL_TABLET | ORAL | 3 refills | Status: DC

## 2017-04-18 NOTE — Progress Notes (Signed)
FOLLOW UP  Assessment and Plan:    Hypertension  Currently controlled with lifestyle changes  Monitor blood pressure at home- call if greater than 130/80 consistently  Continue DASH diet.    Reminder to go to the ER if any CP, SOB, nausea, dizziness, severe HA, changes vision/speech, left arm numbness and tingling and jaw pain.  Cholesterol  Continue medications: Lipitor 80 mg daily  Continue diet and exercise.   Check lipid panel.    Diabetes without complications  Continue medications: metformin 500 mg x4 ----discussed if A1C not significantly improved, will initiate a second agent  Continue diet and exercise.   Perform daily foot/skin check, notify office of any concerning changes.   Check A1C  Vitamin D Def  Continue supplementation  Check Vit D level  Obesity with co morbidities  Long discussion about weight loss, diet, and exercise  Discussed goal weight for height   Hypothyroidism  continue medications the same: synthroid 150 mcg  reminded to take on an empty stomach 30-89mins before food.   check TSH level  Testosterone deficiency  Testosterone check today  Lower urinary obstructive symptoms  Patient describes advanced urinary obstructive symptoms, I-PSS 34  Reports he was placed on sildenafil for this issue several years ago- reported history of poorly tolerated tamsulosin and other unknown agent  Checking PSA today  Pending labs consider a retry of tamsulosin vs low dose hytrin, initiating finasteride today   Continue diet and meds as discussed. Further disposition pending results of labs. Discussed med's effects and SE's.   Over 30 minutes of exam, counseling, chart review, and critical decision making was performed.   Future Appointments Date Time Provider [place]  08/01/2017 3:00 PM Unk Pinto, MD [facility] None     ----------------------------------------------------------------------------------------------------------------------  HPI 61 y.o. male  presents for 3 month follow up on hypertension, cholesterol, prediabetes, obesity, hypothyroid and vitamin D deficiency. He reports he started on a "Melaleuca" Supplement program for the past 2 months, but has intentionally otherwise not improved his diet (he admits to regular consumption of chocolate, hershey's kisses, etc throughout the day) -he reports he is looking forward to today's lab results and will decide whether to continue. He has lost 13 lb since his last visit. However, he reports having increased urinary symptoms, having to get up to urinate every 2 hours- endorses slow stream, straining to initiate stream, sensation of incomplete emptying. He scores 34 on I-PSS today. He reports he has been on tadalafil for several years for "prostate issues"- he states he has been on flomax and other "similar medication" but tolerated poorly.   BMI is Body mass index is 29.44 kg/m., he is not working on diet and exercise. Ideal weight 163lb > Wt Readings from Last 3 Encounters:  04/19/17 188 lb (85.3 kg)  01/13/17 201 lb 9.6 oz (91.4 kg)  10/06/16 199 lb 3.2 oz (90.4 kg)   His blood pressure has been controlled at home, today their BP is BP: 102/72  He does not workout, but works a full time very physically intense job. He denies chest pain, shortness of breath, dizziness.   He is on cholesterol medication and denies myalgias. His cholesterol is not at goal. The cholesterol last visit was:   Lab Results  Component Value Date   CHOL 152 01/13/2017   HDL 39 (L) 01/13/2017   LDLCALC NOT CALC 01/13/2017   TRIG 525 (H) 01/13/2017   CHOLHDL 3.9 01/13/2017    He has not been working on  diet and exercise for diabetes, and denies hyperglycemia, hypoglycemia , nausea and paresthesia of the feet. Last A1C in the office was:  Lab Results  Component Value Date    HGBA1C 8.8 (H) 01/13/2017   Patient is on Vitamin D supplement but remains below goal of 60:  Lab Results  Component Value Date   VD25OH 54 01/13/2017     He is on thyroid medication. His medication was not changed last visit.   Lab Results  Component Value Date   TSH 0.48 01/13/2017  .   Current Medications:  Current Outpatient Prescriptions on File Prior to Visit  Medication Sig  . aspirin 81 MG tablet Take 81 mg by mouth daily.  Marland Kitchen atorvastatin (LIPITOR) 80 MG tablet Take 80 mg by mouth daily.  Marland Kitchen buPROPion (WELLBUTRIN SR) 150 MG 12 hr tablet Take 1 tablet (150 mg total) by mouth 2 (two) times daily.  Marland Kitchen esomeprazole (NEXIUM) 40 MG capsule Take 40 mg by mouth 2 (two) times daily.  Marland Kitchen levothyroxine (SYNTHROID, LEVOTHROID) 150 MCG tablet Take 150 mcg by mouth daily.  . metFORMIN (GLUCOPHAGE-XR) 500 MG 24 hr tablet TAKE 1 TABLET TWICE DAILY WITH BREAKFAST AND LUNCH, AND 2 TABLETS WITH SUPPER  . OVER THE COUNTER MEDICATION Patient takes Alavert 24 hour 1 tablet daily.  . tadalafil (CIALIS) 20 MG tablet 1 tablet every 2 to 3 days for XXXX  . testosterone cypionate (DEPO-TESTOSTERONE) 200 MG/ML injection Inject 2 mLs (400 mg total) into the muscle every 14 (fourteen) days.   No current facility-administered medications on file prior to visit.      Allergies: No Known Allergies   Medical History:  Past Medical History:  Diagnosis Date  . Arthritis   . Complication of anesthesia    Pt states that he experienced some numbness on right side of tongue after surgery for 2 months; continues to experience gum sensitivity to date  . Depression   . Diabetes mellitus without complication ([place])   . DJD (degenerative joint disease)   . GERD (gastroesophageal reflux disease)   . Headache(784.0)    Sinus headaches  . Hyperlipidemia   . Hypertension   . Hypothyroidism   . Other testicular hypofunction   . Prediabetes   . Sleep apnea 2006   uses a cpap -desated to 55% during study, uses  CPAP nightly  . Thyroid disease   . Tinnitus of both ears    Hx: of  . Vitamin D deficiency    Family history- Reviewed and unchanged Social history- Reviewed and unchanged   Review of Systems:  Review of Systems  Constitutional: Negative for malaise/fatigue.  HENT: Negative for congestion, hearing loss and sore throat.   Eyes: Negative for blurred vision.  Respiratory: Negative for cough, shortness of breath and wheezing.   Cardiovascular: Negative for chest pain, palpitations, orthopnea, claudication and leg swelling.  Gastrointestinal: Positive for heartburn (Chronic ongoing). Negative for abdominal pain, blood in stool, constipation, diarrhea, melena, nausea and vomiting.  Genitourinary: Positive for frequency and urgency. Negative for dysuria, flank pain and hematuria.  Musculoskeletal: Negative for falls and myalgias.  Skin: Negative.  Negative for rash.  Neurological: Negative for dizziness, tingling, sensory change, weakness and headaches.  Endo/Heme/Allergies: Positive for polydipsia.  Psychiatric/Behavioral: Negative.  Negative for depression. The patient is not nervous/anxious.     Physical Exam: BP 102/72   Pulse 92   Temp (!) 97.2 F (36.2 C)   Ht 5\' 7"  (1.702 m)   Wt 188 lb (85.3 kg)  SpO2 96%   BMI 29.44 kg/m  Wt Readings from Last 3 Encounters:  04/19/17 188 lb (85.3 kg)  01/13/17 201 lb 9.6 oz (91.4 kg)  10/06/16 199 lb 3.2 oz (90.4 kg)   General Appearance: Well nourished, in no apparent distress. Eyes: PERRLA, EOMs, conjunctiva no swelling or erythema Sinuses: No Frontal/maxillary tenderness ENT/Mouth: Ext aud canals clear, TMs without erythema, bulging. No erythema, swelling, or exudate on post pharynx.  Tonsils not swollen or erythematous. Hearing normal.  Neck: Supple, thyroid normal.  Respiratory: Respiratory effort normal, BS equal bilaterally without rales, rhonchi, wheezing or stridor.  Cardio: RRR with no MRGs. Brisk peripheral pulses without  edema.  Abdomen: Soft, + BS.  Non tender, no guarding, rebound, hernias, masses. Lymphatics: Non tender without lymphadenopathy.  Musculoskeletal: Full ROM, 5/5 strength, Normal gait Skin: Warm, dry without rashes, lesions, ecchymosis.  Neuro: Cranial nerves intact. No cerebellar symptoms.  Psych: Awake and oriented X 3, normal affect, Insight and Judgment appropriate.    Izora Ribas, NP 4:47 PM [facility]

## 2017-04-19 ENCOUNTER — Ambulatory Visit (INDEPENDENT_AMBULATORY_CARE_PROVIDER_SITE_OTHER): Payer: BLUE CROSS/BLUE SHIELD | Admitting: Adult Health

## 2017-04-19 ENCOUNTER — Encounter: Payer: Self-pay | Admitting: Adult Health

## 2017-04-19 VITALS — BP 102/72 | HR 92 | Temp 97.2°F | Ht 67.0 in | Wt 188.0 lb

## 2017-04-19 DIAGNOSIS — E663 Overweight: Secondary | ICD-10-CM | POA: Diagnosis not present

## 2017-04-19 DIAGNOSIS — E349 Endocrine disorder, unspecified: Secondary | ICD-10-CM | POA: Diagnosis not present

## 2017-04-19 DIAGNOSIS — I10 Essential (primary) hypertension: Secondary | ICD-10-CM | POA: Diagnosis not present

## 2017-04-19 DIAGNOSIS — Z79899 Other long term (current) drug therapy: Secondary | ICD-10-CM

## 2017-04-19 DIAGNOSIS — N182 Chronic kidney disease, stage 2 (mild): Secondary | ICD-10-CM

## 2017-04-19 DIAGNOSIS — E079 Disorder of thyroid, unspecified: Secondary | ICD-10-CM | POA: Diagnosis not present

## 2017-04-19 DIAGNOSIS — E1122 Type 2 diabetes mellitus with diabetic chronic kidney disease: Secondary | ICD-10-CM

## 2017-04-19 DIAGNOSIS — E782 Mixed hyperlipidemia: Secondary | ICD-10-CM | POA: Diagnosis not present

## 2017-04-19 DIAGNOSIS — Z125 Encounter for screening for malignant neoplasm of prostate: Secondary | ICD-10-CM | POA: Diagnosis not present

## 2017-04-19 DIAGNOSIS — N139 Obstructive and reflux uropathy, unspecified: Secondary | ICD-10-CM | POA: Insufficient documentation

## 2017-04-19 DIAGNOSIS — E559 Vitamin D deficiency, unspecified: Secondary | ICD-10-CM | POA: Diagnosis not present

## 2017-04-19 MED ORDER — FINASTERIDE 5 MG PO TABS: 5.0000 mg | ORAL_TABLET | Freq: Every day | ORAL | 3 refills | Status: DC

## 2017-04-19 NOTE — Patient Instructions (Signed)

## 2017-04-20 ENCOUNTER — Other Ambulatory Visit: Payer: Self-pay | Admitting: Adult Health

## 2017-04-20 ENCOUNTER — Encounter: Payer: Self-pay | Admitting: Adult Health

## 2017-04-20 DIAGNOSIS — E781 Pure hyperglyceridemia: Secondary | ICD-10-CM | POA: Insufficient documentation

## 2017-04-20 DIAGNOSIS — N139 Obstructive and reflux uropathy, unspecified: Secondary | ICD-10-CM

## 2017-04-20 DIAGNOSIS — E1165 Type 2 diabetes mellitus with hyperglycemia: Secondary | ICD-10-CM | POA: Insufficient documentation

## 2017-04-20 DIAGNOSIS — IMO0002 Reserved for concepts with insufficient information to code with codable children: Secondary | ICD-10-CM | POA: Insufficient documentation

## 2017-04-20 LAB — CBC WITH DIFFERENTIAL/PLATELET
Basophils Absolute: 91 cells/uL (ref 0–200)
Basophils Relative: 1.3 %
Eosinophils Absolute: 357 cells/uL (ref 15–500)
Eosinophils Relative: 5.1 %
HCT: 51.4 % — ABNORMAL HIGH (ref 38.5–50.0)
Hemoglobin: 17.6 g/dL — ABNORMAL HIGH (ref 13.2–17.1)
Lymphs Abs: 3157 cells/uL (ref 850–3900)
MCH: 29.1 pg (ref 27.0–33.0)
MCHC: 34.2 g/dL (ref 32.0–36.0)
MCV: 85.1 fL (ref 80.0–100.0)
MPV: 9.5 fL (ref 7.5–12.5)
Monocytes Relative: 6.4 %
Neutro Abs: 2947 cells/uL (ref 1500–7800)
Neutrophils Relative %: 42.1 %
Platelets: 264 10*3/uL (ref 140–400)
RBC: 6.04 10*6/uL — ABNORMAL HIGH (ref 4.20–5.80)
RDW: 13.1 % (ref 11.0–15.0)
Total Lymphocyte: 45.1 %
WBC mixed population: 448 cells/uL (ref 200–950)
WBC: 7 10*3/uL (ref 3.8–10.8)

## 2017-04-20 LAB — HEPATIC FUNCTION PANEL
AG Ratio: 1.6 (calc) (ref 1.0–2.5)
ALT: 26 U/L (ref 9–46)
AST: 17 U/L (ref 10–35)
Albumin: 4.5 g/dL (ref 3.6–5.1)
Alkaline phosphatase (APISO): 66 U/L (ref 40–115)
Bilirubin, Direct: 0 mg/dL (ref 0.0–0.2)
Globulin: 2.8 g/dL (calc) (ref 1.9–3.7)
Indirect Bilirubin: 0.4 mg/dL (calc) (ref 0.2–1.2)
Total Bilirubin: 0.4 mg/dL (ref 0.2–1.2)
Total Protein: 7.3 g/dL (ref 6.1–8.1)

## 2017-04-20 LAB — LIPID PANEL
Cholesterol: 227 mg/dL — ABNORMAL HIGH (ref <200)
HDL: 42 mg/dL (ref >40)
Non-HDL Cholesterol (Calc): 185 mg/dL (calc) — ABNORMAL HIGH (ref <130)
Total CHOL/HDL Ratio: 5.4 (calc) — ABNORMAL HIGH (ref <5.0)
Triglycerides: 755 mg/dL — ABNORMAL HIGH (ref <150)

## 2017-04-20 LAB — BASIC METABOLIC PANEL WITH GFR
BUN: 14 mg/dL (ref 7–25)
CO2: 28 mmol/L (ref 20–32)
Calcium: 9.6 mg/dL (ref 8.6–10.3)
Chloride: 98 mmol/L (ref 98–110)
Creat: 1.02 mg/dL (ref 0.70–1.33)
GFR, Est African American: 93 mL/min/{1.73_m2} (ref >=60)
GFR, Est Non African American: 80 mL/min/{1.73_m2} (ref >=60)
Glucose, Bld: 234 mg/dL — ABNORMAL HIGH (ref 65–99)
Potassium: 4 mmol/L (ref 3.5–5.3)
Sodium: 135 mmol/L (ref 135–146)

## 2017-04-20 LAB — HEMOGLOBIN A1C
Hgb A1c MFr Bld: 10.3 % of total Hgb — ABNORMAL HIGH (ref <5.7)
Mean Plasma Glucose: 249 (calc)
eAG (mmol/L): 13.8 (calc)

## 2017-04-20 LAB — TESTOSTERONE: Testosterone: 345 ng/dL (ref 250–827)

## 2017-04-20 LAB — TSH: TSH: 1.4 mIU/L (ref 0.40–4.50)

## 2017-04-20 LAB — INSULIN, RANDOM: Insulin: 9.9 u[IU]/mL (ref 2.0–19.6)

## 2017-04-20 LAB — VITAMIN D 25 HYDROXY (VIT D DEFICIENCY, FRACTURES): Vit D, 25-Hydroxy: 34 ng/mL (ref 30–100)

## 2017-04-20 LAB — PSA: PSA: 1.1 ng/mL (ref <=4.0)

## 2017-04-20 MED ORDER — FENOFIBRATE 145 MG PO TABS: 145.0000 mg | ORAL_TABLET | Freq: Every day | ORAL | 1 refills | Status: AC

## 2017-04-20 MED ORDER — TAMSULOSIN HCL 0.4 MG PO CAPS: 0.4000 mg | ORAL_CAPSULE | Freq: Every day | ORAL | 2 refills | Status: DC

## 2017-04-20 MED ORDER — SITAGLIPTIN PHOSPHATE 100 MG PO TABS: 100.0000 mg | ORAL_TABLET | Freq: Every day | ORAL | 1 refills | Status: DC

## 2017-04-20 NOTE — Progress Notes (Signed)
Spoke with pt & he states he is aware of the MyChart message & he states that he will message you later with his concerns.

## 2017-04-20 NOTE — Progress Notes (Unsigned)
Markedly elevated triglycerides (755 up from 525 and A1C (10.3 up from previous 8.8) after trying unknown supplement x 2 months. Patient currently on atorvastatin 80 mg daily and metformin 500 mg 1 tab with breakfast and lunch, and 2 tabs with dinner for a total of 2000 mg daily. Will start fenofibrate 145 mg daily, and januvia 100 mg daily. Requested he stop the supplement, and also add cinnamon 1000 mg supplement daily, and make significant lifestyle changes   Starting finasteride 5 mg, flomax 0.4 mg daily for severe lower urinary obstructive symptoms- if he does not tolerate this medication again will send to urology. Will follow up in 4 weeks.

## 2017-04-25 ENCOUNTER — Encounter: Payer: Self-pay | Admitting: Adult Health

## 2017-05-09 ENCOUNTER — Encounter (INDEPENDENT_AMBULATORY_CARE_PROVIDER_SITE_OTHER): Payer: Self-pay

## 2017-06-08 ENCOUNTER — Encounter (INDEPENDENT_AMBULATORY_CARE_PROVIDER_SITE_OTHER): Payer: Self-pay

## 2017-08-01 ENCOUNTER — Ambulatory Visit (INDEPENDENT_AMBULATORY_CARE_PROVIDER_SITE_OTHER): Payer: BLUE CROSS/BLUE SHIELD | Admitting: Internal Medicine

## 2017-08-01 ENCOUNTER — Encounter: Payer: Self-pay | Admitting: Internal Medicine

## 2017-08-01 VITALS — BP 120/80 | HR 96 | Temp 97.3°F | Resp 16 | Ht 67.0 in | Wt 189.8 lb

## 2017-08-01 DIAGNOSIS — Z0001 Encounter for general adult medical examination with abnormal findings: Secondary | ICD-10-CM

## 2017-08-01 DIAGNOSIS — Z125 Encounter for screening for malignant neoplasm of prostate: Secondary | ICD-10-CM

## 2017-08-01 DIAGNOSIS — E559 Vitamin D deficiency, unspecified: Secondary | ICD-10-CM

## 2017-08-01 DIAGNOSIS — I2510 Atherosclerotic heart disease of native coronary artery without angina pectoris: Secondary | ICD-10-CM

## 2017-08-01 DIAGNOSIS — Z1211 Encounter for screening for malignant neoplasm of colon: Secondary | ICD-10-CM

## 2017-08-01 DIAGNOSIS — Z136 Encounter for screening for cardiovascular disorders: Secondary | ICD-10-CM

## 2017-08-01 DIAGNOSIS — I10 Essential (primary) hypertension: Secondary | ICD-10-CM | POA: Diagnosis not present

## 2017-08-01 DIAGNOSIS — Z0000 Encounter for general adult medical examination without abnormal findings: Secondary | ICD-10-CM | POA: Diagnosis not present

## 2017-08-01 DIAGNOSIS — E291 Testicular hypofunction: Secondary | ICD-10-CM

## 2017-08-01 DIAGNOSIS — E1122 Type 2 diabetes mellitus with diabetic chronic kidney disease: Secondary | ICD-10-CM

## 2017-08-01 DIAGNOSIS — E349 Endocrine disorder, unspecified: Secondary | ICD-10-CM

## 2017-08-01 DIAGNOSIS — I700 Atherosclerosis of aorta: Secondary | ICD-10-CM

## 2017-08-01 DIAGNOSIS — Z122 Encounter for screening for malignant neoplasm of respiratory organs: Secondary | ICD-10-CM

## 2017-08-01 DIAGNOSIS — R5383 Other fatigue: Secondary | ICD-10-CM

## 2017-08-01 DIAGNOSIS — Z79899 Other long term (current) drug therapy: Secondary | ICD-10-CM

## 2017-08-01 DIAGNOSIS — Z87891 Personal history of nicotine dependence: Secondary | ICD-10-CM

## 2017-08-01 DIAGNOSIS — Z1212 Encounter for screening for malignant neoplasm of rectum: Secondary | ICD-10-CM

## 2017-08-01 DIAGNOSIS — N139 Obstructive and reflux uropathy, unspecified: Secondary | ICD-10-CM

## 2017-08-01 DIAGNOSIS — E782 Mixed hyperlipidemia: Secondary | ICD-10-CM

## 2017-08-01 DIAGNOSIS — G4733 Obstructive sleep apnea (adult) (pediatric): Secondary | ICD-10-CM

## 2017-08-01 DIAGNOSIS — N182 Chronic kidney disease, stage 2 (mild): Secondary | ICD-10-CM

## 2017-08-01 DIAGNOSIS — Z9989 Dependence on other enabling machines and devices: Secondary | ICD-10-CM

## 2017-08-01 NOTE — Patient Instructions (Signed)

## 2017-08-01 NOTE — Progress Notes (Signed)
[facility]   Unk Pinto, M.D.     Uvaldo Bristle. Silverio Lay, P.A.-C Liane Comber, [place]                [address] [place], [place] [address] Telephone [phone] Telefax [phone] Annual  Screening/Preventative Visit  & Comprehensive Evaluation & Examination     This very nice 62 y.o. MWM presents for a Screening/Preventative Visit & comprehensive evaluation and management of multiple medical co-morbidities.  Patient has been followed for HTN, T2_NIDDM  , Hyperlipidemia and Vitamin D Deficiency. Patient has GERD and reports control of Sx's on Nexium. He also has OSA/on CPAP and reports improved restorative sleep.      Due to his her long history of smoking over 40 pk years,  I discussed lung cancer screening with him. He was agreeable to undergo a screening low dose CT scan of the chest.  We discussed smoking cessation techniques/options. I will refer him for a LDCT lung scan & lung cancer screening program     HTN predates since 2008. Patient's BP has been controlled at home.  Today's BP is at goal -  120/80.  Patient had a False Positive Cardiolite in 2011 confirmed by Nl Heart Cath. Patient denies any cardiac symptoms as chest pain, palpitations, shortness of breath, dizziness or ankle swelling.     Patient's hyperlipidemia is controlled with diet and medications. Patient denies myalgias or other medication SE's. Last lipids were  Lab Results  Component Value Date   CHOL 227 (H) 04/19/2017   HDL 42 04/19/2017   LDLCALC NOT CALC 01/13/2017   TRIG 755 (H) 04/19/2017   CHOLHDL 5.4 (H) 04/19/2017      Patient has Morbid Obesity (BMI 30) and T2_NIDDM since 2014 and patient denies reactive hypoglycemic symptoms, visual blurring, diabetic polys or paresthesias. Last A1c was not at goal and patient alleges dramatic dietary changes having stopped eating about 1/2 pound of candy/nite and stopping  all soft drinks:   Lab Results  Component Value Date   HGBA1C 10.3 (H) 04/19/2017       Patient has Testosterone Deficiency on parenteral replacement and reports increased stamina and sense of well being on treatment.  Finally, patient has history of Vitamin D Deficiency ("27"/2008) and last vitamin D was still not at goal: Lab Results  Component Value Date   VD25OH 34 04/19/2017   Current Outpatient Medications on File Prior to Visit  Medication Sig  . aspirin 81 MG tablet Take 81 mg by mouth daily.  Marland Kitchen atorvastatin (LIPITOR) 80 MG tablet Take 80 mg by mouth daily.  Marland Kitchen buPROPion (WELLBUTRIN SR) 150 MG 12 hr tablet Take 1 tablet (150 mg total) by mouth 2 (two) times daily.  Marland Kitchen esomeprazole (NEXIUM) 40 MG capsule Take 40 mg by mouth 2 (two) times daily.  . fenofibrate (TRICOR) 145 MG tablet Take 1 tablet (145 mg total) by mouth daily.  . finasteride (PROSCAR) 5 MG tablet Take 1 tablet (5 mg total) by mouth daily.  Marland Kitchen levothyroxine (SYNTHROID, LEVOTHROID) 150 MCG tablet Take 150 mcg by mouth daily.  . metFORMIN (GLUCOPHAGE-XR) 500 MG 24 hr tablet TAKE 1 TABLET TWICE DAILY WITH BREAKFAST AND LUNCH, AND 2 TABLETS WITH SUPPER  . OVER THE COUNTER MEDICATION Patient takes Alavert 24 hour 1 tablet daily.  . tadalafil (CIALIS) 20 MG tablet 1 tablet every 2 to  3 days for XXXX  . testosterone cypionate (DEPO-TESTOSTERONE) 200 MG/ML injection Inject 2 mLs (400 mg total) into the muscle every 14 (fourteen) days.  . sitaGLIPtin (JANUVIA) 100 MG tablet Take 1 tablet (100 mg total) by mouth daily. (Patient not taking: Reported on 08/01/2017)   No current facility-administered medications on file prior to visit.    No Known Allergies Past Medical History:  Diagnosis Date  . Arthritis   . Complication of anesthesia    Pt states that he experienced some numbness on right side of tongue after surgery for 2 months; continues to experience gum sensitivity to date  . Depression   . Diabetes mellitus without  complication ([place])   . DJD (degenerative joint disease)   . GERD (gastroesophageal reflux disease)   . Headache(784.0)    Sinus headaches  . Hyperlipidemia   . Hypertension   . Hypothyroidism   . Other testicular hypofunction   . Prediabetes   . Sleep apnea 2006   uses a cpap -desated to 55% during study, uses CPAP nightly  . Thyroid disease   . Tinnitus of both ears    Hx: of  . Vitamin D deficiency    Health Maintenance  Topic Date Due  . COLONOSCOPY  06/23/2007  . PNEUMOCOCCAL POLYSACCHARIDE VACCINE (2) 04/11/2016  . FOOT EXAM  06/17/2017  . URINE MICROALBUMIN  06/17/2017  . HEMOGLOBIN A1C  10/18/2017  . OPHTHALMOLOGY EXAM  12/11/2017  . TETANUS/TDAP  04/11/2021  . INFLUENZA VACCINE  Completed  . Hepatitis C Screening  Completed  . HIV Screening  Completed   Immunization History  Administered Date(s) Administered  . Influenza Split 04/18/2013, 05/05/2016  . Influenza, Seasonal, Injecte, Preservative Fre 06/16/2015  . Pneumococcal Polysaccharide-23 04/12/2011  . Tdap 04/12/2011   Last Colon -  Past Surgical History:  Procedure Laterality Date  . CARDIAC CATHETERIZATION  03/30/2010   normal coronaries, RCA takeoff of the left coronary cusp, EF 55% [facility])  . CARPAL TUNNEL RELEASE Right 01/12/2016   Procedure: CARPAL TUNNEL RELEASE;  Surgeon: Tania Ade, MD;  Location: [place];  Service: Orthopedics;  Laterality: Right;  Right carpal tunnel release  . COLONOSCOPY     x2  . KNEE ARTHROPLASTY Left 10/16/2012   Procedure: LEFT COMPUTER ASSISTED TOTAL KNEE ARTHROPLASTY;  Surgeon: Alta Corning, MD;  Location: [place];  Service: Orthopedics;  Laterality: Left;  . KNEE ARTHROSCOPY  05/12/2012   Procedure: ARTHROSCOPY KNEE;  Surgeon: Alta Corning, MD;  Location: [place];  Service: Orthopedics;  Laterality: Left;  . KNEE SURGERY    . MOUTH SURGERY    . TOTAL KNEE ARTHROPLASTY Left 10/16/2012   Dr Berenice Primas  . UPPER GI ENDOSCOPY      x2   Family History  Problem Relation Age of Onset  . Cancer Mother        breast  . Hyperlipidemia Mother   . Hypertension Mother   . Cancer Father        pancreatic   Social History   Socioeconomic History  . Marital status: Married    Spouse name: Ivin Booty  . Number of children: 3 children  . Highest education level: Forensic psychologist  Occupational History  . Real Estate  Tobacco Use  . Smoking status: Former Smoker    Packs/day: 1.00    Years: 40.00    Pack years: 40.00    Types: Cigarettes    Last attempt to quit: 04/14/2015    Years since  quitting: 2.3  . Smokeless tobacco: Never Used  . Tobacco comment: "Vaping"   Substance and Sexual Activity  . Alcohol use: No    Alcohol/week: 0.6 oz    Types: 1 Standard drinks or equivalent per week    Comment: rarely  . Drug use: No  . Sexual activity: Yes  Other Topics Concern  . Not on file  Social History Narrative  . Not on file    ROS Constitutional: Denies fever, chills, weight loss/gain, headaches, insomnia,  night sweats or change in appetite. Does c/o fatigue. Eyes: Denies redness, blurred vision, diplopia, discharge, itchy or watery eyes.  ENT: Denies discharge, congestion, post nasal drip, epistaxis, sore throat, earache, hearing loss, dental pain, Tinnitus, Vertigo, Sinus pain or snoring.  Cardio: Denies chest pain, palpitations, irregular heartbeat, syncope, dyspnea, diaphoresis, orthopnea, PND, claudication or edema Respiratory: denies cough, dyspnea, DOE, pleurisy, hoarseness, laryngitis or wheezing.  Gastrointestinal: Denies dysphagia, heartburn, reflux, water brash, pain, cramps, nausea, vomiting, bloating, diarrhea, constipation, hematemesis, melena, hematochezia, jaundice or hemorrhoids Genitourinary: Denies dysuria, frequency, urgency, nocturia, hesitancy, discharge, hematuria or flank pain Musculoskeletal: Denies arthralgia, myalgia, stiffness, Jt. Swelling, pain, limp or strain/sprain. Denies  Falls. Skin: Denies puritis, rash, hives, warts, acne, eczema or change in skin lesion Neuro: No weakness, tremor, incoordination, spasms, paresthesia or pain Psychiatric: Denies confusion, memory loss or sensory loss. Denies Depression. Endocrine: Denies change in weight, skin, hair change, nocturia, and paresthesia, diabetic polys, visual blurring or hyper / hypo glycemic episodes.  Heme/Lymph: No excessive bleeding, bruising or enlarged lymph nodes.  Physical Exam  BP 120/80   Pulse 96   Temp (!) 97.3 F (36.3 C)   Resp 16   Ht 5\' 7"  (1.702 m)   Wt 189 lb 12.8 oz (86.1 kg)   BMI 29.73 kg/m   General Appearance: Well nourished and well groomed and in no apparent distress.  Eyes: PERRLA, EOMs, conjunctiva no swelling or erythema, normal fundi and vessels. Sinuses: No frontal/maxillary tenderness ENT/Mouth: EACs patent / TMs  nl. Nares clear without erythema, swelling, mucoid exudates. Oral hygiene is good. No erythema, swelling, or exudate. Tongue normal, non-obstructing. Tonsils not swollen or erythematous. Hearing normal.  Neck: Supple, thyroid normal. No bruits, nodes or JVD. Respiratory: Respiratory effort normal.  BS equal and clear bilateral without rales, rhonci, wheezing or stridor. Cardio: Heart sounds are normal with regular rate and rhythm and no murmurs, rubs or gallops. Peripheral pulses are normal and equal bilaterally without edema. No aortic or femoral bruits. Chest: symmetric with normal excursions and percussion.  Abdomen: Soft, with Nl bowel sounds. Nontender, no guarding, rebound, hernias, masses, or organomegaly.  Lymphatics: Non tender without lymphadenopathy.  Genitourinary: No hernias.Testes nl. DRE - prostate nl for age - smooth & firm w/o nodules. Musculoskeletal: Full ROM all peripheral extremities, joint stability, 5/5 strength, and normal gait. Skin: Warm and dry without rashes, lesions, cyanosis, clubbing or  ecchymosis.  Neuro: Cranial nerves intact,  reflexes equal bilaterally. Normal muscle tone, no cerebellar symptoms. Sensation intact.  Pysch: Alert and oriented X 3 with normal affect, insight and judgment appropriate.   Assessment and Plan  1. Annual Preventative/Screening Exam   1. Encounter for general adult medical examination with abnormal findings   2. Essential hypertension  - EKG 12-Lead - [place], RETROPERITNL ABD,  LTD - Urinalysis, Routine w reflex microscopic - Microalbumin / creatinine urine ratio - CBC with Differential/Platelet - BASIC METABOLIC PANEL WITH GFR - Magnesium - TSH  3. Hyperlipidemia, mixed  - EKG 12-Lead -  [place], RETROPERITNL ABD,  LTD - Hepatic function panel - Lipid panel - TSH  4. Type 2 diabetes mellitus with stage 2 chronic kidney disease, without long-term current use of insulin (HCC)  - EKG 12-Lead - [place], RETROPERITNL ABD,  LTD - Urinalysis, Routine w reflex microscopic - Microalbumin / creatinine urine ratio - HM DIABETES FOOT EXAM - LOW EXTREMITY NEUR EXAM DOCUM - Hemoglobin A1c - Insulin, random  5. Vitamin D deficiency  - VITAMIN D 25 Hydroxy (Vit-D Deficiency, Fractures)  6. Testosterone deficiency  - Testosterone  7. OSA on CPAP   8. Prostate cancer screening  - PSA  9. Lower urinary obstructive symptom  - PSA  10. Screening for colorectal cancer  - POC Hemoccult Bld/Stl (3-Cd Home Screen); Future  11. Screening for ischemic heart disease  - EKG 12-Lead  12. Atherosclerosis of native coronary artery of native heart without angina pectoris  - EKG 12-Lead  13. Former smoker  - EKG 12-Lead - [place], RETROPERITNL ABD,  LTD  14. Screening for AAA (aortic abdominal aneurysm)  - [place], RETROPERITNL ABD,  LTD  15. Aortic atherosclerosis (HCC)  - [place], RETROPERITNL ABD,  LTD  16. Fatigue, unspecified type  - Iron,Total/Total Iron Binding Cap - Vitamin B12 - Testosterone - TSH  17. Medication management  - Urinalysis, Routine w reflex microscopic -  Microalbumin / creatinine urine ratio - Testosterone - CBC with Differential/Platelet - BASIC METABOLIC PANEL WITH GFR - Hepatic function panel - Magnesium - Lipid panel - TSH - Hemoglobin A1c - Insulin, random - VITAMIN D 25 Hydroxy   18. Encounter for screening for malignant neoplasm of respiratory organs  - CT CHEST NODULE FOLLOW UP LOW DOSE W/O; Future        Patient was counseled in prudent diet, weight control to achieve/maintain BMI less than 25, BP monitoring, regular exercise and medications as discussed.  Discussed med effects and SE's. Routine screening labs and tests as requested with regular follow-up as recommended. Over 40 minutes of exam, counseling, chart review and high complex critical decision making was performed

## 2017-08-02 LAB — URINALYSIS, ROUTINE W REFLEX MICROSCOPIC
Bilirubin Urine: NEGATIVE
Hgb urine dipstick: NEGATIVE
Ketones, ur: NEGATIVE
Leukocytes, UA: NEGATIVE
Nitrite: NEGATIVE
Protein, ur: NEGATIVE
Specific Gravity, Urine: 1.022 (ref 1.001–1.03)
pH: 7 (ref 5.0–8.0)

## 2017-08-02 LAB — PSA: PSA: 1.1 ng/mL (ref <=4.0)

## 2017-08-02 LAB — HEPATIC FUNCTION PANEL
AG Ratio: 1.7 (calc) (ref 1.0–2.5)
ALT: 26 U/L (ref 9–46)
AST: 20 U/L (ref 10–35)
Albumin: 4.7 g/dL (ref 3.6–5.1)
Alkaline phosphatase (APISO): 45 U/L (ref 40–115)
Bilirubin, Direct: 0.1 mg/dL (ref 0.0–0.2)
Globulin: 2.7 g/dL (calc) (ref 1.9–3.7)
Indirect Bilirubin: 0.3 mg/dL (calc) (ref 0.2–1.2)
Total Bilirubin: 0.4 mg/dL (ref 0.2–1.2)
Total Protein: 7.4 g/dL (ref 6.1–8.1)

## 2017-08-02 LAB — BASIC METABOLIC PANEL WITH GFR
BUN/Creatinine Ratio: 11 (calc) (ref 6–22)
BUN: 14 mg/dL (ref 7–25)
CO2: 30 mmol/L (ref 20–32)
Calcium: 10 mg/dL (ref 8.6–10.3)
Chloride: 103 mmol/L (ref 98–110)
Creat: 1.27 mg/dL — ABNORMAL HIGH (ref 0.70–1.25)
GFR, Est African American: 71 mL/min/{1.73_m2} (ref >=60)
GFR, Est Non African American: 61 mL/min/{1.73_m2} (ref >=60)
Glucose, Bld: 141 mg/dL — ABNORMAL HIGH (ref 65–99)
Potassium: 4.3 mmol/L (ref 3.5–5.3)
Sodium: 139 mmol/L (ref 135–146)

## 2017-08-02 LAB — CBC WITH DIFFERENTIAL/PLATELET
Basophils Absolute: 79 cells/uL (ref 0–200)
Basophils Relative: 1.3 %
Eosinophils Absolute: 153 cells/uL (ref 15–500)
Eosinophils Relative: 2.5 %
HCT: 48.4 % (ref 38.5–50.0)
Hemoglobin: 16.7 g/dL (ref 13.2–17.1)
Lymphs Abs: 2855 cells/uL (ref 850–3900)
MCH: 29.2 pg (ref 27.0–33.0)
MCHC: 34.5 g/dL (ref 32.0–36.0)
MCV: 84.8 fL (ref 80.0–100.0)
MPV: 9.6 fL (ref 7.5–12.5)
Monocytes Relative: 8.3 %
Neutro Abs: 2507 cells/uL (ref 1500–7800)
Neutrophils Relative %: 41.1 %
Platelets: 298 10*3/uL (ref 140–400)
RBC: 5.71 10*6/uL (ref 4.20–5.80)
RDW: 13 % (ref 11.0–15.0)
Total Lymphocyte: 46.8 %
WBC mixed population: 506 cells/uL (ref 200–950)
WBC: 6.1 10*3/uL (ref 3.8–10.8)

## 2017-08-02 LAB — MICROALBUMIN / CREATININE URINE RATIO
Creatinine, Urine: 90 mg/dL (ref 20–320)
Microalb Creat Ratio: 10 mcg/mg creat (ref <30)
Microalb, Ur: 0.9 mg/dL

## 2017-08-02 LAB — HEMOGLOBIN A1C
Hgb A1c MFr Bld: 7.1 % of total Hgb — ABNORMAL HIGH (ref <5.7)
Mean Plasma Glucose: 157 (calc)
eAG (mmol/L): 8.7 (calc)

## 2017-08-02 LAB — IRON, TOTAL/TOTAL IRON BINDING CAP
%SAT: 46 % (calc) (ref 15–60)
Iron: 182 ug/dL — ABNORMAL HIGH (ref 50–180)
TIBC: 392 mcg/dL (calc) (ref 250–425)

## 2017-08-02 LAB — INSULIN, RANDOM: Insulin: 43.8 u[IU]/mL — ABNORMAL HIGH (ref 2.0–19.6)

## 2017-08-02 LAB — LIPID PANEL
Cholesterol: 162 mg/dL (ref <200)
HDL: 43 mg/dL (ref >40)
LDL Cholesterol (Calc): 88 mg/dL (calc)
Non-HDL Cholesterol (Calc): 119 mg/dL (calc) (ref <130)
Total CHOL/HDL Ratio: 3.8 (calc) (ref <5.0)
Triglycerides: 215 mg/dL — ABNORMAL HIGH (ref <150)

## 2017-08-02 LAB — MAGNESIUM: Magnesium: 2.2 mg/dL (ref 1.5–2.5)

## 2017-08-02 LAB — TSH: TSH: 1.21 mIU/L (ref 0.40–4.50)

## 2017-08-02 LAB — VITAMIN D 25 HYDROXY (VIT D DEFICIENCY, FRACTURES): Vit D, 25-Hydroxy: 73 ng/mL (ref 30–100)

## 2017-08-02 LAB — VITAMIN B12: Vitamin B-12: 666 pg/mL (ref 200–1100)

## 2017-08-02 LAB — TESTOSTERONE: Testosterone: 483 ng/dL (ref 250–827)

## 2017-08-03 ENCOUNTER — Other Ambulatory Visit: Payer: Self-pay | Admitting: Internal Medicine

## 2017-08-03 ENCOUNTER — Encounter (INDEPENDENT_AMBULATORY_CARE_PROVIDER_SITE_OTHER): Payer: Self-pay

## 2017-08-05 ENCOUNTER — Encounter (INDEPENDENT_AMBULATORY_CARE_PROVIDER_SITE_OTHER): Payer: Self-pay

## 2017-08-29 ENCOUNTER — Other Ambulatory Visit: Payer: Self-pay | Admitting: Internal Medicine

## 2017-08-29 ENCOUNTER — Ambulatory Visit
Admission: RE | Admit: 2017-08-29 | Discharge: 2017-08-29 | Disposition: A | Discharge status: Home / Self Care | Payer: BLUE CROSS/BLUE SHIELD | Source: Ambulatory Visit | Attending: Internal Medicine | Admitting: Internal Medicine

## 2017-08-29 DIAGNOSIS — R9389 Abnormal findings on diagnostic imaging of other specified body structures: Secondary | ICD-10-CM

## 2017-08-29 DIAGNOSIS — Z122 Encounter for screening for malignant neoplasm of respiratory organs: Secondary | ICD-10-CM

## 2017-08-29 MED ORDER — LEVOFLOXACIN 500 MG PO TABS: ORAL_TABLET | ORAL | 1 refills | Status: DC

## 2017-09-01 ENCOUNTER — Ambulatory Visit (INDEPENDENT_AMBULATORY_CARE_PROVIDER_SITE_OTHER): Payer: BLUE CROSS/BLUE SHIELD | Admitting: Pulmonary Disease

## 2017-09-01 ENCOUNTER — Encounter: Payer: Self-pay | Admitting: Pulmonary Disease

## 2017-09-01 DIAGNOSIS — G4733 Obstructive sleep apnea (adult) (pediatric): Secondary | ICD-10-CM

## 2017-09-01 DIAGNOSIS — R918 Other nonspecific abnormal finding of lung field: Secondary | ICD-10-CM | POA: Diagnosis not present

## 2017-09-01 DIAGNOSIS — Z9989 Dependence on other enabling machines and devices: Secondary | ICD-10-CM

## 2017-09-01 NOTE — Progress Notes (Signed)
Subjective:    Patient ID: Omar [name], male    DOB: [birth year], 63 y.o.   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 He smoked more than 40 pack years before he quit 04/2015. He underwent screening CT chest 06/2015 -this did not show any suspicious pulmonary nodules and was labeled as RADS category 1S, modified due to non-lung cancer related finding specifically atherosclerosis of coronary arteries.  Very mild centrilobular emphysema was also noted.  He subsequently underwent evaluation at the [facility] for coronaries including a negative cardiac cath. He had a URI 2 weeks ago followed by a chest cold and yellow sputum production which seems to have now subsided.  Around this time on 2/25 he underwent a previously scheduled another screening CT chest.  This showed multifocal groundglass and sub-solid nodular opacities in bilateral upper lobes, the dominant nodule was 13 mm in the right lung apex-these are all new findings compared to the prior CT scan and multifocal infection was suggested as a cause.  He now denies cough, sputum production, wheezing or dyspnea Denies weight loss or fevers. He is a diabetic and has OSA and uses a CPAP machine There is no family history of lung cancer.  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 Arthritis   . Complication of anesthesia    Pt states that he experienced some numbness on right side of tongue after surgery for 2 months; continues to experience gum sensitivity to date  . Depression   . Diabetes mellitus without complication ([place])   . DJD (degenerative joint disease)   . GERD (gastroesophageal reflux disease)   . Headache(784.0)    Sinus headaches  . Hyperlipidemia   . Hypertension   . Hypothyroidism   . Other testicular hypofunction   . Prediabetes   . Sleep apnea 2006   uses a cpap -desated to  55% during study, uses CPAP nightly  . Thyroid disease   . Tinnitus of both ears    Hx: of  . Vitamin D deficiency      Past Surgical History:  Procedure Laterality Date  . CARDIAC CATHETERIZATION  03/30/2010   normal coronaries, RCA takeoff of the left coronary cusp, EF 55% [facility])  . CARPAL TUNNEL RELEASE Right 01/12/2016   Procedure: CARPAL TUNNEL RELEASE;  Surgeon: Tania Ade, MD;  Location: [place];  Service: Orthopedics;  Laterality: Right;  Right carpal tunnel release  . COLONOSCOPY     x2  . KNEE ARTHROPLASTY Left 10/16/2012   Procedure: LEFT COMPUTER ASSISTED TOTAL KNEE ARTHROPLASTY;  Surgeon: Alta Corning, MD;  Location: [place];  Service: Orthopedics;  Laterality: Left;  . KNEE ARTHROSCOPY  05/12/2012   Procedure: ARTHROSCOPY KNEE;  Surgeon: Alta Corning, MD;  Location: [place];  Service: Orthopedics;  Laterality: Left;  . KNEE SURGERY    . MOUTH SURGERY    . TOTAL KNEE ARTHROPLASTY Left 10/16/2012   Dr Berenice Primas  . UPPER GI ENDOSCOPY     x2   No Known Allergies   Social History   Socioeconomic History  . Marital status: Married    Spouse name: Not on file  . Number of children: Not on file  . Years of education: Not on file  . Highest education level: Not on file  Social Needs  . Financial resource strain: Not on file  .  Food insecurity - worry: Not on file  . Food insecurity - inability: Not on file  . Transportation needs - medical: Not on file  . Transportation needs - non-medical: Not on file  Occupational History  . Not on file  Tobacco Use  . Smoking status: Former Smoker    Packs/day: 1.00    Years: 40.00    Pack years: 40.00    Types: Cigarettes    Last attempt to quit: 04/14/2015    Years since quitting: 2.3  . Smokeless tobacco: Never Used  . Tobacco comment: "Vaping"   Substance and Sexual Activity  . Alcohol use: No    Alcohol/week: 0.6 oz    Types: 1 Standard drinks or equivalent per week     Comment: rarely  . Drug use: No  . Sexual activity: Yes  Other Topics Concern  . Not on file  Social History Narrative  . Not on file     Family History  Problem Relation Age of Onset  . Cancer Mother        breast  . Hyperlipidemia Mother   . Hypertension Mother   . Cancer Father        pancreatic       Review of Systems Positive for shortness of breath with activity, productive cough now resolved, acid heartburn, indigestion, sneezing, nasal congestion, joint stiffness  Constitutional: negative for anorexia, fevers and sweats  Eyes: negative for irritation, redness and visual disturbance  Ears, nose, mouth, throat, and face: negative for earaches, epistaxis, nasal congestion and sore throat  Respiratory: negative for cough, dyspnea on exertion, sputum and wheezing  Cardiovascular: negative for chest pain, dyspnea, lower extremity edema, orthopnea, palpitations and syncope  Gastrointestinal: negative for abdominal pain, constipation, diarrhea, melena, nausea and vomiting  Genitourinary:negative for dysuria, frequency and hematuria  Hematologic/lymphatic: negative for bleeding, easy bruising and lymphadenopathy  Musculoskeletal:negative for arthralgias, muscle weakness and stiff joints  Neurological: negative for coordination problems, gait problems, headaches and weakness  Endocrine: negative for diabetic symptoms including polydipsia, polyuria and weight loss     Objective:   Physical Exam  Gen. Pleasant, well-nourished, in no distress, normal affect ENT - no lesions, no post nasal drip Neck: No JVD, no thyromegaly, no carotid bruits Lungs: no use of accessory muscles, no dullness to percussion, clear without rales or rhonchi  Cardiovascular: Rhythm regular, heart sounds  normal, no murmurs or gallops, no peripheral edema Abdomen: soft and non-tender, no hepatosplenomegaly, BS normal. Musculoskeletal: No deformities, no cyanosis or clubbing Neuro:  alert, non  focal       Assessment & Plan:

## 2017-09-01 NOTE — Patient Instructions (Signed)
CT chest without contrast in 3 months to follow up on nodules in the lung

## 2017-09-02 DIAGNOSIS — R918 Other nonspecific abnormal finding of lung field: Secondary | ICD-10-CM | POA: Insufficient documentation

## 2017-09-02 NOTE — Assessment & Plan Note (Signed)
Compliant with his machine and this is certainly helped improve his daytime somnolence and fatigue

## 2017-09-02 NOTE — Assessment & Plan Note (Addendum)
Unfortunately the CT scan was performed when he was just recovering from what sounds like a chest cold/bronchitis, likely viral origin. Multifocal groundglass opacities would be consistent with this.  I really feel that this is very unlikely to be lung cancer I have advised a follow-up CT chest without contrast in 3 months and should expect these opacities to be fully resolved by then he does I have a risk of lung malignancy even though he quit smoking 2 years ago and should continue to have screening imaging studies done

## 2017-09-04 ENCOUNTER — Encounter (INDEPENDENT_AMBULATORY_CARE_PROVIDER_SITE_OTHER): Payer: Self-pay

## 2017-09-04 ENCOUNTER — Other Ambulatory Visit: Payer: Self-pay | Admitting: Internal Medicine

## 2017-09-04 MED ORDER — HYOSCYAMINE SULFATE 0.125 MG SL SUBL
SUBLINGUAL_TABLET | SUBLINGUAL | 0 refills | Status: DC
Start: 2017-09-04 — End: 2017-12-08

## 2017-11-07 ENCOUNTER — Ambulatory Visit: Payer: Self-pay | Admitting: Internal Medicine

## 2017-12-07 ENCOUNTER — Ambulatory Visit (INDEPENDENT_AMBULATORY_CARE_PROVIDER_SITE_OTHER)
Admission: RE | Admit: 2017-12-07 | Discharge: 2017-12-07 | Disposition: A | Discharge status: Home / Self Care | Payer: BLUE CROSS/BLUE SHIELD | Source: Ambulatory Visit | Attending: Pulmonary Disease | Admitting: Pulmonary Disease

## 2017-12-07 DIAGNOSIS — R918 Other nonspecific abnormal finding of lung field: Secondary | ICD-10-CM | POA: Diagnosis not present

## 2017-12-08 ENCOUNTER — Encounter: Payer: Self-pay | Admitting: Pulmonary Disease

## 2017-12-08 ENCOUNTER — Ambulatory Visit (INDEPENDENT_AMBULATORY_CARE_PROVIDER_SITE_OTHER): Payer: BLUE CROSS/BLUE SHIELD | Admitting: Pulmonary Disease

## 2017-12-08 DIAGNOSIS — R053 Chronic cough: Secondary | ICD-10-CM | POA: Insufficient documentation

## 2017-12-08 DIAGNOSIS — R05 Cough: Secondary | ICD-10-CM

## 2017-12-08 DIAGNOSIS — R918 Other nonspecific abnormal finding of lung field: Secondary | ICD-10-CM | POA: Diagnosis not present

## 2017-12-08 NOTE — Progress Notes (Signed)
   Subjective:    Patient ID: Omar [name], male    DOB: February 12, 1956, 62 y.o.   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 He is a diabetic and has OSA on CPAP  He reports perennial postnasal drainage and excessive sweating.  He underwent allergy testing skin but did not want allergy shots.  He takes Afrin and Claritin-D on an as-needed basis.  He has occasional cough related to this He denies wheezing or dyspnea  We reviewed CT chest from 12/07/2017 in detail  Significant tests/ events reviewed    screening CT chest 06/2015 -RADS category 1S, modified due to non-lung cancer related finding specifically atherosclerosis of coronary arteries.  Very mild centrilobular emphysema was also noted.    08/2017 screening CT chest (URI) >>multifocal groundglass and sub-solid nodular opacities in bilateral upper lobes, the dominant nodule was 13 mm in the right lung apex- all new findings compared to the prior CT scan and multifocal infection was suggested as a cause.   Review of Systems Patient denies significant dyspnea,cough, hemoptysis,  chest pain, palpitations, pedal edema, orthopnea, paroxysmal nocturnal dyspnea, lightheadedness, nausea, vomiting, abdominal or  leg pains      Objective:   Physical Exam  Gen. Pleasant, well-nourished, in no distress ENT - no thrush, no post nasal drip Neck: No JVD, no thyromegaly, no carotid bruits Lungs: no use of accessory muscles, no dullness to percussion, clear without rales or rhonchi  Cardiovascular: Rhythm regular, heart sounds  normal, no murmurs or gallops, no peripheral edema Musculoskeletal: No deformities, no cyanosis or clubbing        Assessment & Plan:

## 2017-12-08 NOTE — Assessment & Plan Note (Signed)
-  Related to postnasal drip. Appears to be perennial symptoms. He will obtain RAST testing at the [place]. He can take chlorpheniramine and store brand Sudafed combination for 2 weeks should symptoms persist or get worse.  Asked him to avoid taking Afrin on  regular basis

## 2017-12-08 NOTE — Patient Instructions (Signed)
CT scan has cleared & shows mild findings of smoking-related damage to the lungs  Continue annual screening CT scans

## 2017-12-08 NOTE — Assessment & Plan Note (Signed)
CT scan has cleared & shows mild findings of smoking-related damage to the lungs  Continue annual screening CT scans

## 2017-12-22 NOTE — Progress Notes (Signed)
Please place orders in epic for preop.. Thank you! 

## 2018-01-03 NOTE — H&P (Signed)
TOTAL KNEE ADMISSION H&P  Patient is being admitted for right total knee arthroplasty.  Subjective:  Chief Complaint:right knee pain.  HPI: Omar [name], 62 y.o. male, has a history of pain and functional disability in the right knee due to arthritis and has failed non-surgical conservative treatments for greater than 12 weeks to includecorticosteriod injections and viscosupplementation injections.  Onset of symptoms was gradual, starting 5 years ago with gradually worsening course since that time. The patient noted no past surgery on the right knee(s).  Patient currently rates pain in the right knee(s) at 5 out of 10 with activity. Patient has night pain, pain that interferes with activities of daily living and crepitus.  Patient has evidence of medial bone-on-bone arthritis with significant patellofemoral narrowing by imaging studies. There is no active infection.  Patient Active Problem List   Diagnosis Date Noted  . Chronic cough 12/08/2017  . Pulmonary nodules 09/02/2017  . Hypertriglyceridemia 04/20/2017  . Uncontrolled type 2 diabetes mellitus ([place]) 04/20/2017  . Lower urinary obstructive symptom 04/19/2017  . BMI 29.0-29.9,adult 05/12/2015  . T2_NIDDM w/Stage 2 CKD (GFR 72 ml/min) 10/23/2013  . Obesity (BMI 30.0-34.9) 10/23/2013  . Medication management 09/27/2013  . Hypertension   . Hyperlipidemia   . DJD   . Hypothyroidism   . Vitamin D deficiency   . OSA on CPAP   . Testosterone deficiency   . Osteoarthritis of left knee 10/16/2012   Past Medical History:  Diagnosis Date  . Arthritis   . Complication of anesthesia    Pt states that he experienced some numbness on right side of tongue after surgery for 2 months; continues to experience gum sensitivity to date  . Depression   . Diabetes mellitus without complication ([place])   . DJD (degenerative joint disease)   . GERD (gastroesophageal reflux disease)   . Headache(784.0)    Sinus headaches  . Hyperlipidemia   .  Hypertension   . Hypothyroidism   . Other testicular hypofunction   . Prediabetes   . Sleep apnea 2006   uses a cpap -desated to 55% during study, uses CPAP nightly  . Thyroid disease   . Tinnitus of both ears    Hx: of  . Vitamin D deficiency     Past Surgical History:  Procedure Laterality Date  . CARDIAC CATHETERIZATION  03/30/2010   normal coronaries, RCA takeoff of the left coronary cusp, EF 55% [facility])  . CARPAL TUNNEL RELEASE Right 01/12/2016   Procedure: CARPAL TUNNEL RELEASE;  Surgeon: Tania Ade, MD;  Location: [place];  Service: Orthopedics;  Laterality: Right;  Right carpal tunnel release  . COLONOSCOPY     x2  . KNEE ARTHROPLASTY Left 10/16/2012   Procedure: LEFT COMPUTER ASSISTED TOTAL KNEE ARTHROPLASTY;  Surgeon: Alta Corning, MD;  Location: [place];  Service: Orthopedics;  Laterality: Left;  . KNEE ARTHROSCOPY  05/12/2012   Procedure: ARTHROSCOPY KNEE;  Surgeon: Alta Corning, MD;  Location: [place];  Service: Orthopedics;  Laterality: Left;  . KNEE SURGERY    . MOUTH SURGERY    . TOTAL KNEE ARTHROPLASTY Left 10/16/2012   Dr Berenice Primas  . UPPER GI ENDOSCOPY     x2    No current facility-administered medications for this encounter.    Current Outpatient Medications  Medication Sig Dispense Refill Last Dose  . atorvastatin (LIPITOR) 80 MG tablet Take 80 mg by mouth daily.   Taking  . buPROPion (WELLBUTRIN SR) 150 MG 12 hr  tablet Take 1 tablet (150 mg total) by mouth 2 (two) times daily. 180 tablet 1 Taking  . esomeprazole (NEXIUM) 40 MG capsule Take 40 mg by mouth 2 (two) times daily.   Taking  . fenofibrate (TRICOR) 145 MG tablet Take 1 tablet (145 mg total) by mouth daily. 90 tablet 1 Taking  . finasteride (PROSCAR) 5 MG tablet Take 1 tablet (5 mg total) by mouth daily. 90 tablet 3 Taking  . levothyroxine (SYNTHROID, LEVOTHROID) 150 MCG tablet Take 150 mcg by mouth daily.   Taking  . metFORMIN (GLUCOPHAGE-XR) 500 MG 24 hr  tablet TAKE 1 TABLET TWICE DAILY WITH BREAKFAST AND LUNCH, AND 2 TABLETS WITH SUPPER 60 tablet 11 Taking  . OVER THE COUNTER MEDICATION Patient takes Alavert 24 hour 1 tablet daily.   Taking  . tadalafil (CIALIS) 20 MG tablet 1 tablet every 2 to 3 days for XXXX 100 tablet 3 Taking  . testosterone cypionate (DEPO-TESTOSTERONE) 200 MG/ML injection Inject 2 mLs (400 mg total) into the muscle every 14 (fourteen) days. 10 mL 5 Taking   No Known Allergies  Social History   Tobacco Use  . Smoking status: Former Smoker    Packs/day: 1.00    Years: 40.00    Pack years: 40.00    Types: Cigarettes    Last attempt to quit: 04/14/2015    Years since quitting: 2.7  . Smokeless tobacco: Never Used  . Tobacco comment: "Vaping"   Substance Use Topics  . Alcohol use: No    Alcohol/week: 0.6 oz    Types: 1 Standard drinks or equivalent per week    Comment: rarely    Family History  Problem Relation Age of Onset  . Cancer Mother        breast  . Hyperlipidemia Mother   . Hypertension Mother   . Cancer Father        pancreatic     Review of Systems  Constitutional: Negative for chills and fever.  HENT: Negative for congestion, sore throat and tinnitus.   Eyes: Negative for double vision, photophobia and pain.  Respiratory: Negative for cough, shortness of breath and wheezing.   Cardiovascular: Negative for chest pain, palpitations and orthopnea.  Gastrointestinal: Negative for heartburn, nausea and vomiting.  Genitourinary: Negative for dysuria, frequency and urgency.  Musculoskeletal: Positive for joint pain.  Neurological: Negative for dizziness, weakness and headaches.  Psychiatric/Behavioral: Negative for depression.    Objective:  Physical Exam  Well nourished and well developed. General: Alert and oriented x3, cooperative and pleasant, no acute distress. Head: normocephalic, atraumatic, neck supple. Eyes: EOMI. Respiratory: breath sounds clear in all fields, no wheezing, rales,  or rhonchi. Cardiovascular: Regular rate and rhythm, no murmurs, gallops or rubs.  Abdomen: non-tender to palpation and soft, normoactive bowel sounds. Musculoskeletal: Antalgic gait pattern on the right. Right Knee Exam:  Slight varus deformity. No effusion. Range of motion is 5-130 degrees. Marked crepitus on range of motion of the knee. Some medial greater than lateral joint line tenderness. Stable knee. Left Knee Exam:  Well healed scar. No effusion. Range of motion is 0-125 degrees. No crepitus on range of motion of the knee. No medial or lateral joint line tenderness. Stable knee. Calves soft and nontender. Motor function intact in LE. Strength 5/5 LE bilaterally. Neuro: Distal pulses 2+. Sensation to light touch intact in LE.  Vital signs in last 24 hours: Blood pressure: 142/90 mmHg Pulse: 84 bpm  Labs:   Estimated body mass index is 28.96 kg/m  as calculated from the following:   Height as of 12/08/17: 5\' 7"  (1.702 m).   Weight as of 12/08/17: 83.9 kg (184 lb 14.4 oz).   Imaging Review Plain radiographs demonstrate severe degenerative joint disease of the right knee(s). The overall alignment isneutral. The bone quality appears to be adequate for age and reported activity level.   Preoperative templating of the joint replacement has been completed, documented, and submitted to the Operating Room personnel in order to optimize intra-operative equipment management.   Anticipated LOS equal to or greater than 2 midnights due to - Age 20 and older with one or more of the following:  - Obesity  - Expected need for hospital services (PT, OT, Nursing) required for safe  discharge  - Anticipated need for postoperative skilled nursing care or inpatient rehab  - Active co-morbidities: Diabetes OR   - Unanticipated findings during/Post Surgery: None  - Patient is a high risk of re-admission due to: None   Assessment/Plan:  End stage arthritis, right knee   The patient history,  physical examination, clinical judgment of the provider and imaging studies are consistent with end stage degenerative joint disease of the right knee(s) and total knee arthroplasty is deemed medically necessary. The treatment options including medical management, injection therapy arthroscopy and arthroplasty were discussed at length. The risks and benefits of total knee arthroplasty were presented and reviewed. The risks due to aseptic loosening, infection, stiffness, patella tracking problems, thromboembolic complications and other imponderables were discussed. The patient acknowledged the explanation, agreed to proceed with the plan and consent was signed. Patient is being admitted for inpatient treatment for surgery, pain control, PT, OT, prophylactic antibiotics, VTE prophylaxis, progressive ambulation and ADL's and discharge planning. The patient is planning to be discharged to home with outpatient physical therapy.  Therapy Plans: outpatient therapy at [facility] Disposition: Home with wife Planned DVT Prophylaxis: aspirin 325mg  BID DME needed: None PCP: Leroy Libman, PA-C TXA: IV Allergies: None Other: Patient endorses high tolerance to pain medications.   - Patient was instructed on what medications to stop prior to surgery. - Follow-up visit in 2 weeks with Dr. Wynelle Link - Begin physical therapy following surgery - Pre-operative lab work as pre-surgical testing - Prescriptions will be provided in hospital at time of discharge  Theresa Duty, PA-C Orthopedic Surgery [facility] [place]

## 2018-01-13 NOTE — Progress Notes (Signed)
Clearance Omar [name] , PA-C 12-06-17 on chart   CT CHEST 12-07-17 Epic   LOV PULM DR. ALVA 12-06-17 Epic   EKG 08-01-17 Epic

## 2018-01-13 NOTE — Patient Instructions (Addendum)
Omar [name]  01/13/2018   Your procedure is scheduled on: 01-23-18   Report to [facility] Main  Entrance    Report to admitting at 9:00AM    Call this number if you have problems the morning of surgery [phone]    PLEASE BRING CPAP MASK AND  TUBING ONLY. DEVICE WILL BE PROVIDED!    Remember: Do not eat food or drink liquids :After Midnight.     Take these medicines the morning of surgery with A SIP OF WATER: Wellbutrin, eye drops, Nexium, fenofibrate, levothyroxine                                You may not have any metal on your body including hair pins and              piercings  Do not wear jewelry, make-up, lotions, powders or perfumes, deodorant                           Men may shave face and neck.   Do not bring valuables to the hospital. [place].  Contacts, dentures or bridgework may not be worn into surgery.  Leave suitcase in the car. After surgery it may be brought to your room.                   Please read over the following fact sheets you were given: _____________________________________________________________________             [facility] - Preparing for Surgery Before surgery, you can play an important role.  Because skin is not sterile, your skin needs to be as free of germs as possible.  You can reduce the number of germs on your skin by washing with CHG (chlorahexidine gluconate) soap before surgery.  CHG is an antiseptic cleaner which kills germs and bonds with the skin to continue killing germs even after washing. Please DO NOT use if you have an allergy to CHG or antibacterial soaps.  If your skin becomes reddened/irritated stop using the CHG and inform your nurse when you arrive at Short Stay. Do not shave (including legs and underarms) for at least 48 hours prior to the first CHG shower.  You may shave your face/neck. Please follow these instructions  carefully:  1.  Shower with CHG Soap the night before surgery and the  morning of Surgery.  2.  If you choose to wash your hair, wash your hair first as usual with your  normal  shampoo.  3.  After you shampoo, rinse your hair and body thoroughly to remove the  shampoo.                           4.  Use CHG as you would any other liquid soap.  You can apply chg directly  to the skin and wash                       Gently with a scrungie or clean washcloth.  5.  Apply the CHG Soap to your body ONLY FROM THE NECK DOWN.   Do not use on face/  open                           Wound or open sores. Avoid contact with eyes, ears mouth and genitals (private parts).                       Wash face,  Genitals (private parts) with your normal soap.             6.  Wash thoroughly, paying special attention to the area where your surgery  will be performed.  7.  Thoroughly rinse your body with warm water from the neck down.  8.  DO NOT shower/wash with your normal soap after using and rinsing off  the CHG Soap.                9.  Pat yourself dry with a clean towel.            10.  Wear clean pajamas.            11.  Place clean sheets on your bed the night of your first shower and do not  sleep with pets. Day of Surgery : Do not apply any lotions/deodorants the morning of surgery.  Please wear clean clothes to the hospital/surgery center.  FAILURE TO FOLLOW THESE INSTRUCTIONS MAY RESULT IN THE CANCELLATION OF YOUR SURGERY PATIENT SIGNATURE_________________________________  NURSE SIGNATURE__________________________________  ________________________________________________________________________   Adam Phenix  An incentive spirometer is a tool that can help keep your lungs clear and active. This tool measures how well you are filling your lungs with each breath. Taking long deep breaths may help reverse or decrease the chance of developing breathing (pulmonary) problems (especially infection)  following:  A long period of time when you are unable to move or be active. BEFORE THE PROCEDURE   If the spirometer includes an indicator to show your best effort, your nurse or respiratory therapist will set it to a desired goal.  If possible, sit up straight or lean slightly forward. Try not to slouch.  Hold the incentive spirometer in an upright position. INSTRUCTIONS FOR USE  1. Sit on the edge of your bed if possible, or sit up as far as you can in bed or on a chair. 2. Hold the incentive spirometer in an upright position. 3. Breathe out normally. 4. Place the mouthpiece in your mouth and seal your lips tightly around it. 5. Breathe in slowly and as deeply as possible, raising the piston or the ball toward the top of the column. 6. Hold your breath for 3-5 seconds or for as long as possible. Allow the piston or ball to fall to the bottom of the column. 7. Remove the mouthpiece from your mouth and breathe out normally. 8. Rest for a few seconds and repeat Steps 1 through 7 at least 10 times every 1-2 hours when you are awake. Take your time and take a few normal breaths between deep breaths. 9. The spirometer may include an indicator to show your best effort. Use the indicator as a goal to work toward during each repetition. 10. After each set of 10 deep breaths, practice coughing to be sure your lungs are clear. If you have an incision (the cut made at the time of surgery), support your incision when coughing by placing a pillow or rolled up towels firmly against it. Once you are able to get out of bed, walk around indoors and  cough well. You may stop using the incentive spirometer when instructed by your caregiver.  RISKS AND COMPLICATIONS  Take your time so you do not get dizzy or light-headed.  If you are in pain, you may need to take or ask for pain medication before doing incentive spirometry. It is harder to take a deep breath if you are having pain. AFTER USE  Rest and  breathe slowly and easily.  It can be helpful to keep track of a log of your progress. Your caregiver can provide you with a simple table to help with this. If you are using the spirometer at home, follow these instructions: [place] IF:   You are having difficultly using the spirometer.  You have trouble using the spirometer as often as instructed.  Your pain medication is not giving enough relief while using the spirometer.  You develop fever of 100.5 F (38.1 C) or higher. SEEK IMMEDIATE MEDICAL CARE IF:   You cough up bloody sputum that had not been present before.  You develop fever of 102 F (38.9 C) or greater.  You develop worsening pain at or near the incision site. MAKE SURE YOU:   Understand these instructions.  Will watch your condition.  Will get help right away if you are not doing well or get worse. Document Released: 11/01/2006 Document Revised: 09/13/2011 Document Reviewed: 01/02/2007 ExitCare Patient Information 2014 ExitCare, [place].   ________________________________________________________________________  WHAT IS A BLOOD TRANSFUSION? Blood Transfusion Information  A transfusion is the replacement of blood or some of its parts. Blood is made up of multiple cells which provide different functions.  Red blood cells carry oxygen and are used for blood loss replacement.  White blood cells fight against infection.  Platelets control bleeding.  Plasma helps clot blood.  Other blood products are available for specialized needs, such as hemophilia or other clotting disorders. BEFORE THE TRANSFUSION  Who gives blood for transfusions?   Healthy volunteers who are fully evaluated to make sure their blood is safe. This is blood bank blood. Transfusion therapy is the safest it has ever been in the practice of medicine. Before blood is taken from a donor, a complete history is taken to make sure that person has no history of diseases nor engages in  risky social behavior (examples are intravenous drug use or sexual activity with multiple partners). The donor's travel history is screened to minimize risk of transmitting infections, such as malaria. The donated blood is tested for signs of infectious diseases, such as HIV and hepatitis. The blood is then tested to be sure it is compatible with you in order to minimize the chance of a transfusion reaction. If you or a relative donates blood, this is often done in anticipation of surgery and is not appropriate for emergency situations. It takes many days to process the donated blood. RISKS AND COMPLICATIONS Although transfusion therapy is very safe and saves many lives, the main dangers of transfusion include:   Getting an infectious disease.  Developing a transfusion reaction. This is an allergic reaction to something in the blood you were given. Every precaution is taken to prevent this. The decision to have a blood transfusion has been considered carefully by your caregiver before blood is given. Blood is not given unless the benefits outweigh the risks. AFTER THE TRANSFUSION  Right after receiving a blood transfusion, you will usually feel much better and more energetic. This is especially true if your red blood cells have gotten low (anemic).  The transfusion raises the level of the red blood cells which carry oxygen, and this usually causes an energy increase.  The nurse administering the transfusion will monitor you carefully for complications. HOME CARE INSTRUCTIONS  No special instructions are needed after a transfusion. You may find your energy is better. Speak with your caregiver about any limitations on activity for underlying diseases you may have. SEEK MEDICAL CARE IF:   Your condition is not improving after your transfusion.  You develop redness or irritation at the intravenous (IV) site. SEEK IMMEDIATE MEDICAL CARE IF:  Any of the following symptoms occur over the next 12  hours:  Shaking chills.  You have a temperature by mouth above 102 F (38.9 C), not controlled by medicine.  Chest, back, or muscle pain.  People around you feel you are not acting correctly or are confused.  Shortness of breath or difficulty breathing.  Dizziness and fainting.  You get a rash or develop hives.  You have a decrease in urine output.  Your urine turns a dark color or changes to pink, red, or brown. Any of the following symptoms occur over the next 10 days:  You have a temperature by mouth above 102 F (38.9 C), not controlled by medicine.  Shortness of breath.  Weakness after normal activity.  The white part of the eye turns yellow (jaundice).  You have a decrease in the amount of urine or are urinating less often.  Your urine turns a dark color or changes to pink, red, or brown. Document Released: 06/18/2000 Document Revised: 09/13/2011 Document Reviewed: 02/05/2008 [facility] Patient Information 2014 [place], [place].  _______________________________________________________________________

## 2018-01-16 ENCOUNTER — Encounter (HOSPITAL_COMMUNITY): Payer: Self-pay

## 2018-01-16 ENCOUNTER — Other Ambulatory Visit: Payer: Self-pay

## 2018-01-16 ENCOUNTER — Encounter (HOSPITAL_COMMUNITY)
Admission: RE | Admit: 2018-01-16 | Discharge: 2018-01-16 | Disposition: A | Discharge status: Home / Self Care | Payer: No Typology Code available for payment source | Source: Ambulatory Visit | Attending: Orthopedic Surgery | Admitting: Orthopedic Surgery

## 2018-01-16 DIAGNOSIS — Z01818 Encounter for other preprocedural examination: Secondary | ICD-10-CM | POA: Diagnosis present

## 2018-01-16 DIAGNOSIS — M1711 Unilateral primary osteoarthritis, right knee: Secondary | ICD-10-CM | POA: Diagnosis not present

## 2018-01-16 LAB — COMPREHENSIVE METABOLIC PANEL
ALT: 22 U/L (ref 0–44)
AST: 22 U/L (ref 15–41)
Albumin: 3.8 g/dL (ref 3.5–5.0)
Alkaline Phosphatase: 37 U/L — ABNORMAL LOW (ref 38–126)
Anion gap: 8 (ref 5–15)
BUN: 16 mg/dL (ref 6–20)
CO2: 24 mmol/L (ref 22–32)
Calcium: 9.5 mg/dL (ref 8.9–10.3)
Chloride: 109 mmol/L (ref 98–111)
Creatinine, Ser: 1.27 mg/dL — ABNORMAL HIGH (ref 0.61–1.24)
GFR calc Af Amer: >60 mL/min (ref >60)
GFR calc non Af Amer: 60 mL/min — ABNORMAL LOW (ref >60)
Glucose, Bld: 152 mg/dL — ABNORMAL HIGH (ref 70–99)
Potassium: 4.6 mmol/L (ref 3.5–5.1)
Sodium: 141 mmol/L (ref 135–145)
Total Bilirubin: 0.6 mg/dL (ref 0.3–1.2)
Total Protein: 6.7 g/dL (ref 6.5–8.1)

## 2018-01-16 LAB — CBC
HCT: 45.8 % (ref 39.0–52.0)
Hemoglobin: 15.1 g/dL (ref 13.0–17.0)
MCH: 29.3 pg (ref 26.0–34.0)
MCHC: 33 g/dL (ref 30.0–36.0)
MCV: 88.8 fL (ref 78.0–100.0)
Platelets: 269 10*3/uL (ref 150–400)
RBC: 5.16 MIL/uL (ref 4.22–5.81)
RDW: 15.1 % (ref 11.5–15.5)
WBC: 5 10*3/uL (ref 4.0–10.5)

## 2018-01-16 LAB — GLUCOSE, CAPILLARY: Glucose-Capillary: 170 mg/dL — ABNORMAL HIGH (ref 70–99)

## 2018-01-16 LAB — PROTIME-INR
INR: 0.89
Prothrombin Time: 12 seconds (ref 11.4–15.2)

## 2018-01-16 LAB — URINALYSIS, ROUTINE W REFLEX MICROSCOPIC
Bilirubin Urine: NEGATIVE
Glucose, UA: 150 mg/dL — AB
Hgb urine dipstick: NEGATIVE
Ketones, ur: 5 mg/dL — AB
Leukocytes, UA: NEGATIVE
Nitrite: NEGATIVE
Protein, ur: NEGATIVE mg/dL
Specific Gravity, Urine: 1.019 (ref 1.005–1.030)
pH: 5 (ref 5.0–8.0)

## 2018-01-16 LAB — APTT: aPTT: 25 seconds (ref 24–36)

## 2018-01-16 LAB — HEMOGLOBIN A1C
Hgb A1c MFr Bld: 6.4 % — ABNORMAL HIGH (ref 4.8–5.6)
Mean Plasma Glucose: 136.98 mg/dL

## 2018-01-16 LAB — SURGICAL PCR SCREEN
MRSA, PCR: NEGATIVE
Staphylococcus aureus: NEGATIVE

## 2018-01-16 LAB — ABO/RH: ABO/RH(D): O POS

## 2018-01-22 MED ORDER — BUPIVACAINE LIPOSOME 1.3 % IJ SUSP
20.0000 mL | Freq: Once | INTRAMUSCULAR | Status: AC
  Filled 2018-01-22: qty 20

## 2018-01-23 ENCOUNTER — Encounter (HOSPITAL_COMMUNITY)
Admission: RE | Disposition: A | Discharge status: Home / Self Care | Payer: Self-pay | Source: Home / Self Care | Attending: Orthopedic Surgery

## 2018-01-23 ENCOUNTER — Inpatient Hospital Stay (HOSPITAL_COMMUNITY): Payer: No Typology Code available for payment source | Admitting: Registered Nurse

## 2018-01-23 ENCOUNTER — Inpatient Hospital Stay (HOSPITAL_COMMUNITY)
Admission: RE | Admit: 2018-01-23 | Discharge: 2018-01-24 | DRG: 470 | Disposition: A | Discharge status: Home / Self Care | Payer: No Typology Code available for payment source | Attending: Orthopedic Surgery | Admitting: Orthopedic Surgery

## 2018-01-23 ENCOUNTER — Other Ambulatory Visit: Payer: Self-pay

## 2018-01-23 ENCOUNTER — Encounter (HOSPITAL_COMMUNITY): Payer: Self-pay | Admitting: *Deleted

## 2018-01-23 DIAGNOSIS — M1711 Unilateral primary osteoarthritis, right knee: Secondary | ICD-10-CM | POA: Diagnosis present

## 2018-01-23 DIAGNOSIS — N182 Chronic kidney disease, stage 2 (mild): Secondary | ICD-10-CM | POA: Diagnosis present

## 2018-01-23 DIAGNOSIS — Z6829 Body mass index (BMI) 29.0-29.9, adult: Secondary | ICD-10-CM | POA: Diagnosis not present

## 2018-01-23 DIAGNOSIS — Z79899 Other long term (current) drug therapy: Secondary | ICD-10-CM | POA: Diagnosis not present

## 2018-01-23 DIAGNOSIS — Z87891 Personal history of nicotine dependence: Secondary | ICD-10-CM | POA: Diagnosis not present

## 2018-01-23 DIAGNOSIS — E785 Hyperlipidemia, unspecified: Secondary | ICD-10-CM | POA: Diagnosis present

## 2018-01-23 DIAGNOSIS — E669 Obesity, unspecified: Secondary | ICD-10-CM | POA: Diagnosis present

## 2018-01-23 DIAGNOSIS — E039 Hypothyroidism, unspecified: Secondary | ICD-10-CM | POA: Diagnosis present

## 2018-01-23 DIAGNOSIS — I129 Hypertensive chronic kidney disease with stage 1 through stage 4 chronic kidney disease, or unspecified chronic kidney disease: Secondary | ICD-10-CM | POA: Diagnosis present

## 2018-01-23 DIAGNOSIS — K219 Gastro-esophageal reflux disease without esophagitis: Secondary | ICD-10-CM | POA: Diagnosis present

## 2018-01-23 DIAGNOSIS — Z79890 Hormone replacement therapy (postmenopausal): Secondary | ICD-10-CM

## 2018-01-23 DIAGNOSIS — E1122 Type 2 diabetes mellitus with diabetic chronic kidney disease: Secondary | ICD-10-CM | POA: Diagnosis present

## 2018-01-23 HISTORY — PX: TOTAL KNEE ARTHROPLASTY: SHX125

## 2018-01-23 LAB — TYPE AND SCREEN
ABO/RH(D): O POS
Antibody Screen: NEGATIVE

## 2018-01-23 LAB — GLUCOSE, CAPILLARY
Glucose-Capillary: 143 mg/dL — ABNORMAL HIGH (ref 70–99)
Glucose-Capillary: 205 mg/dL — ABNORMAL HIGH (ref 70–99)
Glucose-Capillary: 197 mg/dL — ABNORMAL HIGH (ref 70–99)

## 2018-01-23 SURGERY — ARTHROPLASTY, KNEE, TOTAL
Anesthesia: Spinal | Site: Knee | Laterality: Right

## 2018-01-23 MED ORDER — ONDANSETRON HCL 4 MG/2ML IJ SOLN: 4.0000 mg | Freq: Four times a day (QID) | INTRAMUSCULAR | Status: DC | PRN

## 2018-01-23 MED ORDER — ONDANSETRON HCL 4 MG PO TABS: 4.0000 mg | ORAL_TABLET | Freq: Four times a day (QID) | ORAL | Status: DC | PRN

## 2018-01-23 MED ORDER — MENTHOL 3 MG MT LOZG: 1.0000 | LOZENGE | OROMUCOSAL | Status: DC | PRN

## 2018-01-23 MED ORDER — ONDANSETRON HCL 4 MG/2ML IJ SOLN: 4.0000 mg | Freq: Once | INTRAMUSCULAR | Status: DC | PRN

## 2018-01-23 MED ORDER — METOCLOPRAMIDE HCL 5 MG PO TABS: 5.0000 mg | ORAL_TABLET | Freq: Three times a day (TID) | ORAL | Status: DC | PRN

## 2018-01-23 MED ORDER — ONDANSETRON HCL 4 MG/2ML IJ SOLN
INTRAMUSCULAR | Status: DC | PRN
  Administered 2018-01-23: 4 mg via INTRAVENOUS

## 2018-01-23 MED ORDER — PHENOL 1.4 % MT LIQD
1.0000 | OROMUCOSAL | Status: DC | PRN
  Filled 2018-01-23: qty 177

## 2018-01-23 MED ORDER — OXYCODONE HCL 5 MG PO TABS: 5.0000 mg | ORAL_TABLET | ORAL | Status: DC | PRN

## 2018-01-23 MED ORDER — ROPIVACAINE HCL 7.5 MG/ML IJ SOLN
INTRAMUSCULAR | Status: DC | PRN
  Administered 2018-01-23: 20 mL via PERINEURAL

## 2018-01-23 MED ORDER — TRANEXAMIC ACID 1000 MG/10ML IV SOLN
1000.0000 mg | INTRAVENOUS | Status: AC
  Administered 2018-01-23: 1000 mg via INTRAVENOUS
  Filled 2018-01-23: qty 10

## 2018-01-23 MED ORDER — FENTANYL CITRATE (PF) 100 MCG/2ML IJ SOLN
50.0000 ug | INTRAMUSCULAR | Status: DC
Start: 2018-01-23 — End: 2018-01-23
  Administered 2018-01-23: 100 ug via INTRAVENOUS
  Filled 2018-01-23: qty 2

## 2018-01-23 MED ORDER — DIPHENHYDRAMINE HCL 12.5 MG/5ML PO ELIX: 12.5000 mg | ORAL_SOLUTION | ORAL | Status: DC | PRN

## 2018-01-23 MED ORDER — PHENYLEPHRINE 40 MCG/ML (10ML) SYRINGE FOR IV PUSH (FOR BLOOD PRESSURE SUPPORT)
PREFILLED_SYRINGE | INTRAVENOUS | Status: DC | PRN
  Administered 2018-01-23 (×2): 80 ug via INTRAVENOUS
  Administered 2018-01-23: 40 ug via INTRAVENOUS
  Administered 2018-01-23: 80 ug via INTRAVENOUS

## 2018-01-23 MED ORDER — 0.9 % SODIUM CHLORIDE (POUR BTL) OPTIME
TOPICAL | Status: DC | PRN
  Administered 2018-01-23: 1000 mL

## 2018-01-23 MED ORDER — LEVOTHYROXINE SODIUM 75 MCG PO TABS
150.0000 ug | ORAL_TABLET | Freq: Every day | ORAL | Status: DC
  Administered 2018-01-24: 150 ug via ORAL
  Filled 2018-01-23: qty 2

## 2018-01-23 MED ORDER — METHOCARBAMOL 500 MG PO TABS
500.0000 mg | ORAL_TABLET | Freq: Four times a day (QID) | ORAL | Status: DC | PRN
  Administered 2018-01-23: 500 mg via ORAL
  Filled 2018-01-23: qty 1

## 2018-01-23 MED ORDER — PANTOPRAZOLE SODIUM 40 MG PO TBEC
80.0000 mg | DELAYED_RELEASE_TABLET | Freq: Two times a day (BID) | ORAL | Status: DC
  Administered 2018-01-23 – 2018-01-24 (×2): 80 mg via ORAL
  Filled 2018-01-23 (×2): qty 2

## 2018-01-23 MED ORDER — DEXAMETHASONE SODIUM PHOSPHATE 10 MG/ML IJ SOLN
INTRAMUSCULAR | Status: AC
  Filled 2018-01-23: qty 1

## 2018-01-23 MED ORDER — ASPIRIN EC 325 MG PO TBEC
325.0000 mg | DELAYED_RELEASE_TABLET | Freq: Two times a day (BID) | ORAL | Status: DC
  Administered 2018-01-24: 325 mg via ORAL
  Filled 2018-01-23: qty 1

## 2018-01-23 MED ORDER — ACETAMINOPHEN 500 MG PO TABS
1000.0000 mg | ORAL_TABLET | Freq: Four times a day (QID) | ORAL | Status: DC
  Administered 2018-01-23 – 2018-01-24 (×3): 1000 mg via ORAL
  Filled 2018-01-23 (×4): qty 2

## 2018-01-23 MED ORDER — PHENYLEPHRINE 40 MCG/ML (10ML) SYRINGE FOR IV PUSH (FOR BLOOD PRESSURE SUPPORT)
PREFILLED_SYRINGE | INTRAVENOUS | Status: AC
  Filled 2018-01-23: qty 10

## 2018-01-23 MED ORDER — ALOGLIPTIN BENZOATE 25 MG PO TABS: 25.0000 mg | ORAL_TABLET | Freq: Every day | ORAL | Status: DC

## 2018-01-23 MED ORDER — ACETAMINOPHEN 10 MG/ML IV SOLN
1000.0000 mg | Freq: Four times a day (QID) | INTRAVENOUS | Status: DC
  Administered 2018-01-23: 1000 mg via INTRAVENOUS
  Filled 2018-01-23: qty 100

## 2018-01-23 MED ORDER — FLEET ENEMA 7-19 GM/118ML RE ENEM: 1.0000 | ENEMA | Freq: Once | RECTAL | Status: DC | PRN

## 2018-01-23 MED ORDER — TRANEXAMIC ACID 1000 MG/10ML IV SOLN
1000.0000 mg | Freq: Once | INTRAVENOUS | Status: AC
  Administered 2018-01-23: 1000 mg via INTRAVENOUS
  Filled 2018-01-23: qty 10

## 2018-01-23 MED ORDER — ATORVASTATIN CALCIUM 40 MG PO TABS
80.0000 mg | ORAL_TABLET | Freq: Every evening | ORAL | Status: DC
  Administered 2018-01-23: 80 mg via ORAL
  Filled 2018-01-23: qty 2

## 2018-01-23 MED ORDER — FENTANYL CITRATE (PF) 100 MCG/2ML IJ SOLN: 25.0000 ug | INTRAMUSCULAR | Status: DC | PRN

## 2018-01-23 MED ORDER — PSEUDOEPHEDRINE HCL ER 120 MG PO TB12
120.0000 mg | ORAL_TABLET | Freq: Two times a day (BID) | ORAL | Status: DC | PRN
  Filled 2018-01-23: qty 1

## 2018-01-23 MED ORDER — CEFAZOLIN SODIUM-DEXTROSE 2-4 GM/100ML-% IV SOLN
2.0000 g | Freq: Four times a day (QID) | INTRAVENOUS | Status: AC
  Administered 2018-01-23 (×2): 2 g via INTRAVENOUS
  Filled 2018-01-23 (×2): qty 100

## 2018-01-23 MED ORDER — METHOCARBAMOL 1000 MG/10ML IJ SOLN
500.0000 mg | Freq: Four times a day (QID) | INTRAVENOUS | Status: DC | PRN
  Administered 2018-01-23: 500 mg via INTRAVENOUS
  Filled 2018-01-23: qty 550

## 2018-01-23 MED ORDER — DEXAMETHASONE SODIUM PHOSPHATE 10 MG/ML IJ SOLN
8.0000 mg | Freq: Once | INTRAMUSCULAR | Status: AC
  Administered 2018-01-23: 8 mg via INTRAVENOUS

## 2018-01-23 MED ORDER — FINASTERIDE 5 MG PO TABS
2.5000 mg | ORAL_TABLET | Freq: Every evening | ORAL | Status: DC
  Administered 2018-01-23: 2.5 mg via ORAL
  Filled 2018-01-23 (×2): qty 0.5

## 2018-01-23 MED ORDER — SODIUM CHLORIDE 0.9 % IJ SOLN
INTRAMUSCULAR | Status: AC
  Filled 2018-01-23: qty 50

## 2018-01-23 MED ORDER — SODIUM CHLORIDE 0.9 % IR SOLN
Status: DC | PRN
  Administered 2018-01-23: 1000 mL

## 2018-01-23 MED ORDER — DOCUSATE SODIUM 100 MG PO CAPS
100.0000 mg | ORAL_CAPSULE | Freq: Two times a day (BID) | ORAL | Status: DC
  Administered 2018-01-23 – 2018-01-24 (×2): 100 mg via ORAL
  Filled 2018-01-23 (×2): qty 1

## 2018-01-23 MED ORDER — LACTATED RINGERS IV SOLN
INTRAVENOUS | Status: DC
  Administered 2018-01-23: 09:00:00 via INTRAVENOUS

## 2018-01-23 MED ORDER — SODIUM CHLORIDE 0.9 % IJ SOLN
INTRAMUSCULAR | Status: DC | PRN
  Administered 2018-01-23: 60 mL

## 2018-01-23 MED ORDER — ONDANSETRON HCL 4 MG/2ML IJ SOLN
INTRAMUSCULAR | Status: AC
  Filled 2018-01-23: qty 2

## 2018-01-23 MED ORDER — GABAPENTIN 300 MG PO CAPS
300.0000 mg | ORAL_CAPSULE | Freq: Once | ORAL | Status: AC
  Administered 2018-01-23: 300 mg via ORAL
  Filled 2018-01-23: qty 1

## 2018-01-23 MED ORDER — LORATADINE 10 MG PO TABS: 10.0000 mg | ORAL_TABLET | Freq: Every day | ORAL | Status: DC | PRN

## 2018-01-23 MED ORDER — SODIUM CHLORIDE 0.9 % IV SOLN
INTRAVENOUS | Status: DC
  Administered 2018-01-23: 16:00:00 via INTRAVENOUS

## 2018-01-23 MED ORDER — OXYCODONE HCL 5 MG PO TABS
10.0000 mg | ORAL_TABLET | ORAL | Status: DC | PRN
  Administered 2018-01-23 – 2018-01-24 (×5): 15 mg via ORAL
  Filled 2018-01-23 (×5): qty 3

## 2018-01-23 MED ORDER — DEXAMETHASONE SODIUM PHOSPHATE 10 MG/ML IJ SOLN
10.0000 mg | Freq: Once | INTRAMUSCULAR | Status: AC
  Administered 2018-01-24: 10 mg via INTRAVENOUS
  Filled 2018-01-23: qty 1

## 2018-01-23 MED ORDER — SODIUM CHLORIDE 0.9 % IV SOLN
INTRAVENOUS | Status: DC | PRN
  Administered 2018-01-23: 25 ug/min via INTRAVENOUS

## 2018-01-23 MED ORDER — LORATADINE-PSEUDOEPHEDRINE ER 10-240 MG PO TB24: 1.0000 | ORAL_TABLET | Freq: Every day | ORAL | Status: DC | PRN

## 2018-01-23 MED ORDER — POLYETHYLENE GLYCOL 3350 17 G PO PACK
17.0000 g | PACK | Freq: Every day | ORAL | Status: DC | PRN
  Administered 2018-01-24: 17 g via ORAL
  Filled 2018-01-23: qty 1

## 2018-01-23 MED ORDER — PROPOFOL 10 MG/ML IV BOLUS
INTRAVENOUS | Status: AC
  Filled 2018-01-23: qty 40

## 2018-01-23 MED ORDER — INSULIN ASPART 100 UNIT/ML ~~LOC~~ SOLN
0.0000 [IU] | Freq: Three times a day (TID) | SUBCUTANEOUS | Status: DC
  Administered 2018-01-23: 5 [IU] via SUBCUTANEOUS
  Administered 2018-01-24 (×2): 2 [IU] via SUBCUTANEOUS

## 2018-01-23 MED ORDER — PHENYLEPHRINE HCL 10 MG/ML IJ SOLN
INTRAMUSCULAR | Status: AC
  Filled 2018-01-23: qty 2

## 2018-01-23 MED ORDER — PROPOFOL 500 MG/50ML IV EMUL
INTRAVENOUS | Status: DC | PRN
  Administered 2018-01-23: 4 ug/kg/min via INTRAVENOUS

## 2018-01-23 MED ORDER — HYDROMORPHONE HCL 1 MG/ML IJ SOLN
0.5000 mg | INTRAMUSCULAR | Status: DC | PRN
  Administered 2018-01-23: 1 mg via INTRAVENOUS
  Filled 2018-01-23: qty 1

## 2018-01-23 MED ORDER — BUPIVACAINE IN DEXTROSE 0.75-8.25 % IT SOLN
INTRATHECAL | Status: DC | PRN
  Administered 2018-01-23: 2 mL via INTRATHECAL

## 2018-01-23 MED ORDER — METOCLOPRAMIDE HCL 5 MG/ML IJ SOLN: 5.0000 mg | Freq: Three times a day (TID) | INTRAMUSCULAR | Status: DC | PRN

## 2018-01-23 MED ORDER — CEFAZOLIN SODIUM-DEXTROSE 2-4 GM/100ML-% IV SOLN
2.0000 g | INTRAVENOUS | Status: AC
  Administered 2018-01-23: 2 g via INTRAVENOUS
  Filled 2018-01-23: qty 100

## 2018-01-23 MED ORDER — CHLORHEXIDINE GLUCONATE 4 % EX LIQD: 60.0000 mL | Freq: Once | CUTANEOUS | Status: DC

## 2018-01-23 MED ORDER — STERILE WATER FOR IRRIGATION IR SOLN
Status: DC | PRN
  Administered 2018-01-23: 2000 mL

## 2018-01-23 MED ORDER — CEFAZOLIN SODIUM-DEXTROSE 2-4 GM/100ML-% IV SOLN
INTRAVENOUS | Status: AC
  Filled 2018-01-23: qty 100

## 2018-01-23 MED ORDER — BUPROPION HCL ER (SR) 150 MG PO TB12
150.0000 mg | ORAL_TABLET | Freq: Two times a day (BID) | ORAL | Status: DC
  Administered 2018-01-23 – 2018-01-24 (×2): 150 mg via ORAL
  Filled 2018-01-23 (×2): qty 1

## 2018-01-23 MED ORDER — SODIUM CHLORIDE 0.9 % IJ SOLN
INTRAMUSCULAR | Status: AC
  Filled 2018-01-23: qty 10

## 2018-01-23 MED ORDER — TRAMADOL HCL 50 MG PO TABS
50.0000 mg | ORAL_TABLET | Freq: Four times a day (QID) | ORAL | Status: DC | PRN
  Administered 2018-01-23 – 2018-01-24 (×3): 100 mg via ORAL
  Filled 2018-01-23 (×3): qty 2

## 2018-01-23 MED ORDER — PROPOFOL 10 MG/ML IV BOLUS
INTRAVENOUS | Status: AC
  Filled 2018-01-23: qty 20

## 2018-01-23 MED ORDER — ALOGLIPTIN BENZOATE 25 MG PO TABS
25.0000 mg | ORAL_TABLET | Freq: Every day | ORAL | Status: DC
  Administered 2018-01-24: 25 mg via ORAL

## 2018-01-23 MED ORDER — MIDAZOLAM HCL 2 MG/2ML IJ SOLN
1.0000 mg | INTRAMUSCULAR | Status: DC
  Administered 2018-01-23: 2 mg via INTRAVENOUS
  Filled 2018-01-23: qty 2

## 2018-01-23 MED ORDER — BISACODYL 10 MG RE SUPP: 10.0000 mg | Freq: Every day | RECTAL | Status: DC | PRN

## 2018-01-23 MED ORDER — FENOFIBRATE 160 MG PO TABS
160.0000 mg | ORAL_TABLET | Freq: Every day | ORAL | Status: DC
  Administered 2018-01-24: 160 mg via ORAL
  Filled 2018-01-23: qty 1

## 2018-01-23 MED ORDER — BUPIVACAINE LIPOSOME 1.3 % IJ SUSP
INTRAMUSCULAR | Status: DC | PRN
  Administered 2018-01-23: 20 mL

## 2018-01-23 MED ORDER — GABAPENTIN 300 MG PO CAPS
300.0000 mg | ORAL_CAPSULE | Freq: Three times a day (TID) | ORAL | Status: DC
  Administered 2018-01-23 – 2018-01-24 (×3): 300 mg via ORAL
  Filled 2018-01-23 (×3): qty 1

## 2018-01-23 SURGICAL SUPPLY — 52 items
BAG ZIPLOCK 12X15 (MISCELLANEOUS) ×3 IMPLANT
BANDAGE ACE 6X5 VEL STRL LF (GAUZE/BANDAGES/DRESSINGS) ×3 IMPLANT
BLADE SAG 18X100X1.27 (BLADE) ×3 IMPLANT
BLADE SAW SGTL 11.0X1.19X90.0M (BLADE) ×3 IMPLANT
BNDG CONFORM 6X.82 1P STRL (GAUZE/BANDAGES/DRESSINGS) ×3 IMPLANT
BOWL SMART MIX CTS (DISPOSABLE) ×3 IMPLANT
CAPT KNEE TOTAL 3 ATTUNE ×3 IMPLANT
CEMENT HV SMART SET (Cement) ×6 IMPLANT
CLOSURE WOUND 1/2 X4 (GAUZE/BANDAGES/DRESSINGS) ×2
COVER SURGICAL LIGHT HANDLE (MISCELLANEOUS) ×3 IMPLANT
CUFF TOURN SGL QUICK 34 (TOURNIQUET CUFF) ×2
CUFF TRNQT CYL 34X4X40X1 (TOURNIQUET CUFF) ×1 IMPLANT
DECANTER SPIKE VIAL GLASS SM (MISCELLANEOUS) ×3 IMPLANT
DRAPE U-SHAPE 47X51 STRL (DRAPES) ×3 IMPLANT
DRSG ADAPTIC 3X8 NADH LF (GAUZE/BANDAGES/DRESSINGS) ×3 IMPLANT
DRSG PAD ABDOMINAL 8X10 ST (GAUZE/BANDAGES/DRESSINGS) ×3 IMPLANT
DURAPREP 26ML APPLICATOR (WOUND CARE) ×3 IMPLANT
ELECT REM PT RETURN 15FT ADLT (MISCELLANEOUS) ×3 IMPLANT
EVACUATOR 1/8 PVC DRAIN (DRAIN) ×3 IMPLANT
GAUZE SPONGE 4X4 12PLY STRL (GAUZE/BANDAGES/DRESSINGS) ×3 IMPLANT
GLOVE BIO SURGEON STRL SZ7 (GLOVE) ×3 IMPLANT
GLOVE BIO SURGEON STRL SZ8 (GLOVE) ×6 IMPLANT
GLOVE BIOGEL PI IND STRL 6.5 (GLOVE) ×1 IMPLANT
GLOVE BIOGEL PI IND STRL 7.0 (GLOVE) ×2 IMPLANT
GLOVE BIOGEL PI IND STRL 8 (GLOVE) ×1 IMPLANT
GLOVE BIOGEL PI INDICATOR 6.5 (GLOVE) ×2
GLOVE BIOGEL PI INDICATOR 7.0 (GLOVE) ×4
GLOVE BIOGEL PI INDICATOR 8 (GLOVE) ×2
GLOVE SURG SS PI 6.5 STRL IVOR (GLOVE) ×3 IMPLANT
GOWN STRL REUS W/TWL LRG LVL3 (GOWN DISPOSABLE) ×6 IMPLANT
HANDPIECE INTERPULSE COAX TIP (DISPOSABLE) ×2
HOLDER FOLEY CATH W/STRAP (MISCELLANEOUS) IMPLANT
IMMOBILIZER KNEE 20 (SOFTGOODS) ×3
IMMOBILIZER KNEE 20 THIGH 36 (SOFTGOODS) ×1 IMPLANT
MANIFOLD NEPTUNE II (INSTRUMENTS) ×3 IMPLANT
NS IRRIG 1000ML POUR BTL (IV SOLUTION) ×3 IMPLANT
PACK TOTAL KNEE CUSTOM (KITS) ×3 IMPLANT
PAD ABD 8X10 STRL (GAUZE/BANDAGES/DRESSINGS) ×3 IMPLANT
PADDING CAST COTTON 6X4 STRL (CAST SUPPLIES) ×9 IMPLANT
POSITIONER SURGICAL ARM (MISCELLANEOUS) ×3 IMPLANT
SET HNDPC FAN SPRY TIP SCT (DISPOSABLE) ×1 IMPLANT
STRIP CLOSURE SKIN 1/2X4 (GAUZE/BANDAGES/DRESSINGS) ×4 IMPLANT
SUT MNCRL AB 4-0 PS2 18 (SUTURE) ×3 IMPLANT
SUT STRATAFIX 0 PDS 27 VIOLET (SUTURE) ×3
SUT VIC AB 2-0 CT1 27 (SUTURE) ×6
SUT VIC AB 2-0 CT1 TAPERPNT 27 (SUTURE) ×3 IMPLANT
SUTURE STRATFX 0 PDS 27 VIOLET (SUTURE) ×1 IMPLANT
TAPE STRIPS DRAPE STRL (GAUZE/BANDAGES/DRESSINGS) ×3 IMPLANT
TRAY FOLEY MTR SLVR 16FR STAT (SET/KITS/TRAYS/PACK) ×3 IMPLANT
WATER STERILE IRR 1000ML POUR (IV SOLUTION) ×6 IMPLANT
WRAP KNEE MAXI GEL POST OP (GAUZE/BANDAGES/DRESSINGS) ×3 IMPLANT
YANKAUER SUCT BULB TIP 10FT TU (MISCELLANEOUS) ×3 IMPLANT

## 2018-01-23 NOTE — Transfer of Care (Signed)
Immediate Anesthesia Transfer of Care Note  Patient: Omar [name]  Procedure(s) Performed: RIGHT TOTAL KNEE ARTHROPLASTY (Right Knee)  Patient Location: PACU  Anesthesia Type:MAC and Spinal  Level of Consciousness: awake, alert , oriented and patient cooperative  Airway & Oxygen Therapy: Patient Spontanous Breathing and Patient connected to face mask oxygen  Post-op Assessment: Report given to RN, Post -op Vital signs reviewed and stable and Patient moving all extremities  Post vital signs: Reviewed and stable  Last Vitals:  Vitals Value Taken Time  BP 91/54 01/23/2018 12:45 PM  Temp    Pulse 95 01/23/2018 12:47 PM  Resp    SpO2 100 % 01/23/2018 12:47 PM  Vitals shown include unvalidated device data.  Last Pain:  Vitals:   01/23/18 0935  TempSrc:   PainSc: 0-No pain      Patients Stated Pain Goal: 3 (52/77/82 [id number])  Complications: No apparent anesthesia complications

## 2018-01-23 NOTE — Interval H&P Note (Signed)
History and Physical Interval Note:  01/23/2018 9:17 AM  Omar [name]  has presented today for surgery, with the diagnosis of Osteoarthritis Right knee  The various methods of treatment have been discussed with the patient and family. After consideration of risks, benefits and other options for treatment, the patient has consented to  Procedure(s): RIGHT TOTAL KNEE ARTHROPLASTY (Right) as a surgical intervention .  The patient's history has been reviewed, patient examined, no change in status, stable for surgery.  I have reviewed the patient's chart and labs.  Questions were answered to the patient's satisfaction.     Pilar Plate Illeana Edick

## 2018-01-23 NOTE — Op Note (Signed)
OPERATIVE REPORT-TOTAL KNEE ARTHROPLASTY   Pre-operative diagnosis- Osteoarthritis  Right knee(s)  Post-operative diagnosis- Osteoarthritis Right knee(s)  Procedure-  Right  Total Knee Arthroplasty (Depuy Attune)  Surgeon- Omar Plover. Omar Voges, MD  Assistant- Omar Jourdain, PA-C   Anesthesia-  Adductor canal block and spinal  EBL-50 mL   Drains Hemovac  Tourniquet time-  Total Tourniquet Time Documented: Thigh (Right) - 33 minutes Total: Thigh (Right) - 33 minutes     Complications- None  Condition-PACU - hemodynamically stable.   Brief Clinical Note  Omar [name] is a 62 y.o. year old male with end stage OA of her right knee with progressively worsening pain and dysfunction. She has constant pain, with activity and at rest and significant functional deficits with difficulties even with ADLs. She has had extensive non-op management including analgesics, injections of cortisone and viscosupplements, and home exercise program, but remains in significant pain with significant dysfunction.Radiographs show bone on bone arthritis medial and patellofemoral. She presents now for right Total Knee Arthroplasty.    Procedure in detail---   The patient is brought into the operating room and positioned supine on the operating table. After successful administration of  Adductor canal block and spinal,   a tourniquet is placed high on the  Right thigh(s) and the lower extremity is prepped and draped in the usual sterile fashion. Time out is performed by the operating team and then the  Right lower extremity is wrapped in Esmarch, knee flexed and the tourniquet inflated to 300 mmHg.       A midline incision is made with a ten blade through the subcutaneous tissue to the level of the extensor mechanism. A fresh blade is used to make a medial parapatellar arthrotomy. Soft tissue over the proximal medial tibia is subperiosteally elevated to the joint line with a knife and into the semimembranosus  bursa with a Cobb elevator. Soft tissue over the proximal lateral tibia is elevated with attention being paid to avoiding the patellar tendon on the tibial tubercle. The patella is everted, knee flexed 90 degrees and the ACL and PCL are removed. Findings are bone on bone medial and patellofemoral with large global osteophytes.        The drill is used to create a starting hole in the distal femur and the canal is thoroughly irrigated with sterile saline to remove the fatty contents. The 5 degree Right  valgus alignment guide is placed into the femoral canal and the distal femoral cutting block is pinned to remove 9 mm off the distal femur. Resection is made with an oscillating saw.      The tibia is subluxed forward and the menisci are removed. The extramedullary alignment guide is placed referencing proximally at the medial aspect of the tibial tubercle and distally along the second metatarsal axis and tibial crest. The block is pinned to remove 52mm off the more deficient medial  side. Resection is made with an oscillating saw. Size 7is the most appropriate size for the tibia and the proximal tibia is prepared with the modular drill and keel punch for that size.      The femoral sizing guide is placed and size 7 is most appropriate. Rotation is marked off the epicondylar axis and confirmed by creating a rectangular flexion gap at 90 degrees. The size 7 cutting block is pinned in this rotation and the anterior, posterior and chamfer cuts are made with the oscillating saw. The intercondylar block is then placed and that cut is made.  Trial size 7 tibial component, trial size 7 posterior stabilized femur and a 10  mm posterior stabilized rotating platform insert trial is placed. Full extension is achieved with excellent varus/valgus and anterior/posterior balance throughout full range of motion. The patella is everted and thickness measured to be 27  mm. Free hand resection is taken to 15 mm, a 41 template is  placed, lug holes are drilled, trial patella is placed, and it tracks normally. Osteophytes are removed off the posterior femur with the trial in place. All trials are removed and the cut bone surfaces prepared with pulsatile lavage. Cement is mixed and once ready for implantation, the size 7 tibial implant, size  7 posterior stabilized femoral component, and the size 41 patella are cemented in place and the patella is held with the clamp. The trial insert is placed and the knee held in full extension. The Exparel (20 ml mixed with 60 ml saline) is injected into the extensor mechanism, posterior capsule, medial and lateral gutters and subcutaneous tissues.  All extruded cement is removed and once the cement is hard the permanent 10 mm posterior stabilized rotating platform insert is placed into the tibial tray.      The wound is copiously irrigated with saline solution and the extensor mechanism closed over a hemovac drain with #1 V-loc suture. The tourniquet is released for a total tourniquet time of 33  minutes. Flexion against gravity is 140 degrees and the patella tracks normally. Subcutaneous tissue is closed with 2.0 vicryl and subcuticular with running 4.0 Monocryl. The incision is cleaned and dried and steri-strips and a bulky sterile dressing are applied. The limb is placed into a knee immobilizer and the patient is awakened and transported to recovery in stable condition.      Please note that a surgical assistant was a medical necessity for this procedure in order to perform it in a safe and expeditious manner. Surgical assistant was necessary to retract the ligaments and vital neurovascular structures to prevent injury to them and also necessary for proper positioning of the limb to allow for anatomic placement of the prosthesis.   Omar Plover [name], MD    01/23/2018, 12:24 PM

## 2018-01-23 NOTE — Anesthesia Procedure Notes (Signed)
Anesthesia Regional Block: Adductor canal block   Pre-Anesthetic Checklist: ,, timeout performed, Correct Patient, Correct Site, Correct Laterality, Correct Procedure, Correct Position, site marked, Risks and benefits discussed,  Surgical consent,  Pre-op evaluation,  At surgeon's request and post-op pain management  Laterality: Right  Prep: chloraprep       Needles:  Injection technique: Single-shot  Needle Type: Echogenic Needle     Needle Length: 9cm  Needle Gauge: 21     Additional Needles:   Procedures:,,,, ultrasound used (permanent image in chart),,,,  Narrative:  Start time: 01/23/2018 10:55 AM End time: 01/23/2018 11:00 AM Injection made incrementally with aspirations every 5 mL.  Performed by: Personally  Anesthesiologist: [name], MD  Additional Notes: No pain on injection. No increased resistance to injection. Injection made in 5cc increments.  Good needle visualization.  Patient tolerated procedure well.

## 2018-01-23 NOTE — Anesthesia Procedure Notes (Signed)
Spinal  Patient location during procedure: OR Start time: 01/23/2018 11:21 AM End time: 01/23/2018 11:26 AM Staffing Resident/CRNA: Victoriano Lain, CRNA Preanesthetic Checklist Completed: patient identified, site marked, surgical consent, pre-op evaluation, timeout performed, IV checked, risks and benefits discussed and monitors and equipment checked Spinal Block Patient position: sitting Prep: site prepped and draped and DuraPrep Patient monitoring: heart rate, continuous pulse ox and blood pressure Approach: midline Location: L3-4 Needle Needle type: Pencan  Needle gauge: 24 G Needle length: 10 cm Additional Notes Spinal kit expiration date checked and verified. Pt placed in sitting position and tolerated the procedure well. + CSF, - heme.

## 2018-01-23 NOTE — Anesthesia Preprocedure Evaluation (Addendum)
Anesthesia Evaluation    Airway Mallampati: II  TM Distance: >3 FB Neck ROM: Full    Dental  (+) Teeth Intact, Dental Advisory Given   Pulmonary sleep apnea and Continuous Positive Airway Pressure Ventilation , former smoker,    Pulmonary exam normal breath sounds clear to auscultation       Cardiovascular hypertension, Normal cardiovascular exam Rhythm:Regular Rate:Normal     Neuro/Psych  Headaches, PSYCHIATRIC DISORDERS Depression    GI/Hepatic GERD  Medicated,  Endo/Other  diabetes, Type 2, Oral Hypoglycemic AgentsHypothyroidism   Renal/GU Renal disease     Musculoskeletal  (+) Arthritis , Osteoarthritis,    Abdominal   Peds  Hematology Plt 269k   Anesthesia Other Findings   Reproductive/Obstetrics                            Anesthesia Physical Anesthesia Plan  ASA: II  Anesthesia Plan: Spinal   Post-op Pain Management:  Regional for Post-op pain   Induction:   PONV Risk Score and Plan: 1 and Propofol infusion, Ondansetron and Midazolam  Airway Management Planned: Natural Airway and Simple Face Mask  Additional Equipment:   Intra-op Plan:   Post-operative Plan:   Informed Consent: I have reviewed the patients History and Physical, chart, labs and discussed the procedure including the risks, benefits and alternatives for the proposed anesthesia with the patient or authorized representative who has indicated his/her understanding and acceptance.   Dental advisory given  Plan Discussed with: CRNA, Anesthesiologist and Surgeon  Anesthesia Plan Comments:         Anesthesia Quick Evaluation

## 2018-01-23 NOTE — Anesthesia Postprocedure Evaluation (Signed)
Anesthesia Post Note  Patient: Omar [name]  Procedure(s) Performed: RIGHT TOTAL KNEE ARTHROPLASTY (Right Knee)     Patient location during evaluation: PACU Anesthesia Type: Spinal Level of consciousness: oriented, awake and alert and awake Pain management: pain level controlled Vital Signs Assessment: post-procedure vital signs reviewed and stable Respiratory status: spontaneous breathing, respiratory function stable, patient connected to nasal cannula oxygen and nonlabored ventilation Cardiovascular status: blood pressure returned to baseline and stable Postop Assessment: no headache, no backache, no apparent nausea or vomiting, patient able to bend at knees and spinal receding Anesthetic complications: no    Last Vitals:  Vitals:   01/23/18 1517 01/23/18 1631  BP: 119/69 122/85  Pulse: 98 89  Resp: 17 14  Temp: 36.9 C 36.9 C  SpO2: 97% 95%    Last Pain:  Vitals:   01/23/18 1631  TempSrc: Oral  PainSc:                  [name]

## 2018-01-23 NOTE — Discharge Instructions (Signed)
° °Dr. Frank Aluisio °Total Joint Specialist °[facility] °[address] °[place], [place] [address] °[phone] ° °TOTAL KNEE REPLACEMENT POSTOPERATIVE DIRECTIONS ° °Knee Rehabilitation, Guidelines Following Surgery  °Results after knee surgery are often greatly improved when you follow the exercise, range of motion and muscle strengthening exercises prescribed by your doctor. Safety measures are also important to protect the knee from further injury. Any time any of these exercises cause you to have increased pain or swelling in your knee joint, decrease the amount until you are comfortable again and slowly increase them. If you have problems or questions, call your caregiver or physical therapist for advice.  ° °HOME CARE INSTRUCTIONS  °• Remove items at home which could result in a fall. This includes throw rugs or furniture in walking pathways.  °· ICE to the affected knee every three hours for 30 minutes at a time and then as needed for pain and swelling.  Continue to use ice on the knee for pain and swelling from surgery. You may notice swelling that will progress down to the foot and ankle.  This is normal after surgery.  Elevate the leg when you are not up walking on it.   °· Continue to use the breathing machine which will help keep your temperature down.  It is common for your temperature to cycle up and down following surgery, especially at night when you are not up moving around and exerting yourself.  The breathing machine keeps your lungs expanded and your temperature down. °· Do not place pillow under knee, focus on keeping the knee straight while resting ° °DIET °You may resume your previous home diet once your are discharged from the hospital. ° °DRESSING / WOUND CARE / SHOWERING °You may shower 3 days after surgery, but keep the wounds dry during showering.  You may use an occlusive plastic wrap (Press'n Seal for example), NO SOAKING/SUBMERGING IN THE BATHTUB.  If the bandage  gets wet, change with a clean dry gauze.  If the incision gets wet, pat the wound dry with a clean towel. °You may start showering once you are discharged home but do not submerge the incision under water. Just pat the incision dry and apply a dry gauze dressing on daily. °Change the surgical dressing daily and reapply a dry dressing each time. ° °ACTIVITY °Walk with your walker as instructed. °Use walker as long as suggested by your caregivers. °Avoid periods of inactivity such as sitting longer than an hour when not asleep. This helps prevent blood clots.  °You may resume a sexual relationship in one month or when given the OK by your doctor.  °You may return to work once you are cleared by your doctor.  °Do not drive a car for 6 weeks or until released by you surgeon.  °Do not drive while taking narcotics. ° °WEIGHT BEARING °Weight bearing as tolerated with assist device (walker, cane, etc) as directed, use it as long as suggested by your surgeon or therapist, typically at least 4-6 weeks. ° °POSTOPERATIVE CONSTIPATION PROTOCOL °Constipation - defined medically as fewer than three stools per week and severe constipation as less than one stool per week. ° °One of the most common issues patients have following surgery is constipation.  Even if you have a regular bowel pattern at home, your normal regimen is likely to be disrupted due to multiple reasons following surgery.  Combination of anesthesia, postoperative narcotics, change in appetite and fluid intake all can affect your bowels.    In order to avoid complications following surgery, here are some recommendations in order to help you during your recovery period. ° °Colace (docusate) - Pick up an over-the-counter form of Colace or another stool softener and take twice a day as long as you are requiring postoperative pain medications.  Take with a full glass of water daily.  If you experience loose stools or diarrhea, hold the colace until you stool forms back  up.  If your symptoms do not get better within 1 week or if they get worse, check with your doctor. ° °Dulcolax (bisacodyl) - Pick up over-the-counter and take as directed by the product packaging as needed to assist with the movement of your bowels.  Take with a full glass of water.  Use this product as needed if not relieved by Colace only.  ° °MiraLax (polyethylene glycol) - Pick up over-the-counter to have on hand.  MiraLax is a solution that will increase the amount of water in your bowels to assist with bowel movements.  Take as directed and can mix with a glass of water, juice, soda, coffee, or tea.  Take if you go more than two days without a movement. °Do not use MiraLax more than once per day. Call your doctor if you are still constipated or irregular after using this medication for 7 days in a row. ° °If you continue to have problems with postoperative constipation, please contact the office for further assistance and recommendations.  If you experience "the worst abdominal pain ever" or develop nausea or vomiting, please contact the office immediatly for further recommendations for treatment. ° °ITCHING ° If you experience itching with your medications, try taking only a single pain pill, or even half a pain pill at a time.  You can also use Benadryl over the counter for itching or also to help with sleep.  ° °TED HOSE STOCKINGS °Wear the elastic stockings on both legs for three weeks following surgery during the day but you may remove then at night for sleeping. ° °MEDICATIONS °See your medication summary on the “After Visit Summary” that the nursing staff will review with you prior to discharge.  You may have some home medications which will be placed on hold until you complete the course of blood thinner medication.  It is important for you to complete the blood thinner medication as prescribed by your surgeon.  Continue your approved medications as instructed at time of discharge. ° °PRECAUTIONS °If  you experience chest pain or shortness of breath - call 911 immediately for transfer to the hospital emergency department.  °If you develop a fever greater that 101 F, purulent drainage from wound, increased redness or drainage from wound, foul odor from the wound/dressing, or calf pain - CONTACT YOUR SURGEON.   °                                                °FOLLOW-UP APPOINTMENTS °Make sure you keep all of your appointments after your operation with your surgeon and caregivers. You should call the office at the above phone number and make an appointment for approximately two weeks after the date of your surgery or on the date instructed by your surgeon outlined in the "After Visit Summary". ° ° °RANGE OF MOTION AND STRENGTHENING EXERCISES  °Rehabilitation of the knee is important following a knee injury or   an operation. After just a few days of immobilization, the muscles of the thigh which control the knee become weakened and shrink (atrophy). Knee exercises are designed to build up the tone and strength of the thigh muscles and to improve knee motion. Often times heat used for twenty to thirty minutes before working out will loosen up your tissues and help with improving the range of motion but do not use heat for the first two weeks following surgery. These exercises can be done on a training (exercise) mat, on the floor, on a table or on a bed. Use what ever works the best and is most comfortable for you Knee exercises include:  °• Leg Lifts - While your knee is still immobilized in a splint or cast, you can do straight leg raises. Lift the leg to 60 degrees, hold for 3 sec, and slowly lower the leg. Repeat 10-20 times 2-3 times daily. Perform this exercise against resistance later as your knee gets better.  °• Quad and Hamstring Sets - Tighten up the muscle on the front of the thigh (Quad) and hold for 5-10 sec. Repeat this 10-20 times hourly. Hamstring sets are done by pushing the foot backward against an  object and holding for 5-10 sec. Repeat as with quad sets.  °· Leg Slides: Lying on your back, slowly slide your foot toward your buttocks, bending your knee up off the floor (only go as far as is comfortable). Then slowly slide your foot back down until your leg is flat on the floor again. °· Angel Wings: Lying on your back spread your legs to the side as far apart as you can without causing discomfort.  °A rehabilitation program following serious knee injuries can speed recovery and prevent re-injury in the future due to weakened muscles. Contact your doctor or a physical therapist for more information on knee rehabilitation.  ° °IF YOU ARE TRANSFERRED TO A SKILLED REHAB FACILITY °If the patient is transferred to a skilled rehab facility following release from the hospital, a list of the current medications will be sent to the facility for the patient to continue.  When discharged from the skilled rehab facility, please have the facility set up the patient's Home Health Physical Therapy prior to being released. Also, the skilled facility will be responsible for providing the patient with their medications at time of release from the facility to include their pain medication, the muscle relaxants, and their blood thinner medication. If the patient is still at the rehab facility at time of the two week follow up appointment, the skilled rehab facility will also need to assist the patient in arranging follow up appointment in our office and any transportation needs. ° °MAKE SURE YOU:  °• Understand these instructions.  °• Get help right away if you are not doing well or get worse.  ° ° °Pick up stool softner and laxative for home use following surgery while on pain medications. °Do not submerge incision under water. °Please use good hand washing techniques while changing dressing each day. °May shower starting three days after surgery. °Please use a clean towel to pat the incision dry following showers. °Continue to  use ice for pain and swelling after surgery. °Do not use any lotions or creams on the incision until instructed by your surgeon. ° °

## 2018-01-23 NOTE — Plan of Care (Signed)
Plan of care discussed.   

## 2018-01-23 NOTE — Progress Notes (Signed)
Assisted Dr. Turk with right, ultrasound guided, adductor canal block. Side rails up, monitors on throughout procedure. See vital signs in flow sheet. Tolerated Procedure well.  

## 2018-01-24 ENCOUNTER — Encounter (HOSPITAL_COMMUNITY): Payer: Self-pay | Admitting: Orthopedic Surgery

## 2018-01-24 LAB — CBC
HCT: 43.3 % (ref 39.0–52.0)
Hemoglobin: 14.1 g/dL (ref 13.0–17.0)
MCH: 28.9 pg (ref 26.0–34.0)
MCHC: 32.6 g/dL (ref 30.0–36.0)
MCV: 88.7 fL (ref 78.0–100.0)
Platelets: 284 10*3/uL (ref 150–400)
RBC: 4.88 MIL/uL (ref 4.22–5.81)
RDW: 14.8 % (ref 11.5–15.5)
WBC: 14.7 10*3/uL — ABNORMAL HIGH (ref 4.0–10.5)

## 2018-01-24 LAB — BASIC METABOLIC PANEL
Anion gap: 7 (ref 5–15)
BUN: 14 mg/dL (ref 6–20)
CO2: 28 mmol/L (ref 22–32)
Calcium: 8.3 mg/dL — ABNORMAL LOW (ref 8.9–10.3)
Chloride: 106 mmol/L (ref 98–111)
Creatinine, Ser: 0.99 mg/dL (ref 0.61–1.24)
GFR calc Af Amer: >60 mL/min (ref >60)
GFR calc non Af Amer: >60 mL/min (ref >60)
Glucose, Bld: 126 mg/dL — ABNORMAL HIGH (ref 70–99)
Potassium: 3.7 mmol/L (ref 3.5–5.1)
Sodium: 141 mmol/L (ref 135–145)

## 2018-01-24 LAB — GLUCOSE, CAPILLARY
Glucose-Capillary: 122 mg/dL — ABNORMAL HIGH (ref 70–99)
Glucose-Capillary: 134 mg/dL — ABNORMAL HIGH (ref 70–99)

## 2018-01-24 MED ORDER — OXYCODONE HCL 10 MG PO TABS: 10.0000 mg | ORAL_TABLET | ORAL | 0 refills | Status: DC | PRN

## 2018-01-24 MED ORDER — ASPIRIN 325 MG PO TBEC: 325.0000 mg | DELAYED_RELEASE_TABLET | Freq: Two times a day (BID) | ORAL | 0 refills | Status: AC

## 2018-01-24 MED ORDER — METHOCARBAMOL 500 MG PO TABS: 500.0000 mg | ORAL_TABLET | Freq: Four times a day (QID) | ORAL | 0 refills | Status: DC | PRN

## 2018-01-24 MED ORDER — TRAMADOL HCL 50 MG PO TABS: 50.0000 mg | ORAL_TABLET | Freq: Four times a day (QID) | ORAL | 0 refills | Status: DC | PRN

## 2018-01-24 MED ORDER — GABAPENTIN 300 MG PO CAPS: 300.0000 mg | ORAL_CAPSULE | Freq: Three times a day (TID) | ORAL | 0 refills | Status: DC

## 2018-01-24 NOTE — Evaluation (Signed)
Physical Therapy Evaluation Patient Details Name: Omar [name] MRN: [id number] DOB: 12-12-1955 Today's Date: 01/24/2018   History of Present Illness  62 yo male s/p R TKA 01/23/18.   Clinical Impression  On eval, pt was Min guard assist for mobility. He walked ~115 feet with a RW. Pain rated 8/10 with activity. Plan is for possible d/c home later today. F/U PT plan is OP PT.    Follow Up Recommendations Follow surgeon's recommendation for DC plan and follow-up therapies    Equipment Recommendations  None recommended by PT    Recommendations for Other Services       Precautions / Restrictions Precautions Precautions: Knee Restrictions Weight Bearing Restrictions: No RLE Weight Bearing: Weight bearing as tolerated      Mobility  Bed Mobility Overal bed mobility: Modified Independent                Transfers Overall transfer level: Needs assistance Equipment used: Rolling walker (2 wheeled) Transfers: Sit to/from Stand Sit to Stand: Supervision         General transfer comment: for safety.   Ambulation/Gait Ambulation/Gait assistance: Min guard Gait Distance (Feet): 115 Feet Assistive device: Rolling walker (2 wheeled) Gait Pattern/deviations: Step-to pattern;Step-through pattern;Antalgic     General Gait Details: VCs safety, technique, sequence. Pt progressed to reciprocal pattern as distance increased. Pain increased as distance increased as well.   Stairs            Wheelchair Mobility    Modified Rankin (Stroke Patients Only)       Balance Overall balance assessment: Mild deficits observed, not formally tested                                           Pertinent Vitals/Pain Pain Assessment: 0-10 Pain Score: 8  Pain Location: R knee with activity Pain Descriptors / Indicators: Aching;Sore;Sharp Pain Intervention(s): Monitored during session;Repositioned;Ice applied    Home Living Family/patient expects to be  discharged to:: Private residence Living Arrangements: Spouse/significant other   Type of Home: House Home Access: Stairs to enter Entrance Stairs-Rails: None Entrance Stairs-Number of Steps: 2-front Home Layout: Two level Home Equipment: Crutches;Walker - 2 wheels;Bedside commode;Shower seat      Prior Function Level of Independence: Independent               Hand Dominance        Extremity/Trunk Assessment   Upper Extremity Assessment Upper Extremity Assessment: Overall WFL for tasks assessed    Lower Extremity Assessment Lower Extremity Assessment: Generalized weakness(s/p R TKA)    Cervical / Trunk Assessment Cervical / Trunk Assessment: Normal  Communication   Communication: No difficulties  Cognition Arousal/Alertness: Awake/alert Behavior During Therapy: WFL for tasks assessed/performed Overall Cognitive Status: Within Functional Limits for tasks assessed                                        General Comments      Exercises Total Joint Exercises Ankle Circles/Pumps: AROM;Both;10 reps;Supine Quad Sets: AROM;Both;10 reps;Supine Heel Slides: AROM;Right;10 reps;Supine Straight Leg Raises: AROM;Right;10 reps;Supine Goniometric ROM: ~10-100 degrees   Assessment/Plan    PT Assessment Patient needs continued PT services  PT Problem List         PT Treatment Interventions DME instruction;Gait training;Therapeutic activities;Therapeutic exercise;Patient/family education;Stair  training;Functional mobility training    PT Goals (Current goals can be found in the Care Plan section)  Acute Rehab PT Goals Patient Stated Goal: regain PLOF PT Goal Formulation: With patient Time For Goal Achievement: 02/07/18 Potential to Achieve Goals: Good    Frequency 7X/week   Barriers to discharge        Co-evaluation               AM-PAC PT "6 Clicks" Daily Activity  Outcome Measure Difficulty turning over in bed (including adjusting  bedclothes, sheets and blankets)?: None Difficulty moving from lying on back to sitting on the side of the bed? : None Difficulty sitting down on and standing up from a chair with arms (e.g., wheelchair, bedside commode, etc,.)?: None Help needed moving to and from a bed to chair (including a wheelchair)?: A Little Help needed walking in hospital room?: A Little Help needed climbing 3-5 steps with a railing? : A Little 6 Click Score: 21    End of Session Equipment Utilized During Treatment: Gait belt Activity Tolerance: Patient tolerated treatment well Patient left: with call bell/phone within reach;in chair   PT Visit Diagnosis: Pain;Other abnormalities of gait and mobility (R26.89) Pain - Right/Left: Right Pain - part of body: Knee    Time: 9924-2683 PT Time Calculation (min) (ACUTE ONLY): 24 min   Charges:   PT Evaluation $PT Eval Low Complexity: 1 Low PT Treatments $Gait Training: 8-22 mins   PT G Codes:          Weston Anna, MPT Pager: [phone]

## 2018-01-24 NOTE — Progress Notes (Addendum)
Subjective: 1 Day Post-Op Procedure(s) (LRB): RIGHT TOTAL KNEE ARTHROPLASTY (Right) Patient reports pain as moderate.   Patient seen in rounds by Dr. Wynelle Link. Patient is well, and has had no acute complaints or problems other than pain in the right knee. Denies chest pain, SOB, or calf pain. Foley catheter removed this AM. We will start therapy today.   Objective: Vital signs in last 24 hours: Temp:  [97.6 F (36.4 C)-98.5 F (36.9 C)] 98.2 F (36.8 C) (07/23 0627) Pulse Rate:  [83-100] 92 (07/23 0627) Resp:  [12-26] 18 (07/23 0627) BP: (91-127)/(54-89) 127/74 (07/23 0627) SpO2:  [93 %-99 %] 97 % (07/23 0627) Weight:  [85.7 kg (189 lb)] 85.7 kg (189 lb) (07/22 0935)  Intake/Output from previous day:  Intake/Output Summary (Last 24 hours) at 01/24/2018 0800 Last data filed at 01/24/2018 6767 Gross per 24 hour  Intake 5460.05 ml  Output 2685 ml  Net 2775.05 ml    Labs: Recent Labs    01/24/18 0544  HGB 14.1   Recent Labs    01/24/18 0544  WBC 14.7*  RBC 4.88  HCT 43.3  PLT 284   Recent Labs    01/24/18 0544  NA 141  K 3.7  CL 106  CO2 28  BUN 14  CREATININE 0.99  GLUCOSE 126*  CALCIUM 8.3*    Exam: General - Patient is Alert and Oriented Extremity - Neurologically intact Neurovascular intact Sensation intact distally Dorsiflexion/Plantar flexion intact Dressing - dressing C/D/I Motor Function - intact, moving foot and toes well on exam.   Past Medical History:  Diagnosis Date  . Arthritis   . Complication of anesthesia    Pt states that he experienced some numbness on right side of tongue after surgery for 2 months; continues to experience gum sensitivity to date  . Depression   . Diabetes mellitus without complication ([place])   . DJD (degenerative joint disease)   . GERD (gastroesophageal reflux disease)   . Headache(784.0)    Sinus headaches  . Hyperlipidemia   . Hypertension    PATIENT DENIES    . Hypothyroidism   . Other testicular  hypofunction   . Prediabetes   . Sleep apnea 2006   uses a cpap -desated to 55% during study, uses CPAP nightly  . Thyroid disease   . Tinnitus of both ears    Hx: of  . Vitamin D deficiency     Assessment/Plan: 1 Day Post-Op Procedure(s) (LRB): RIGHT TOTAL KNEE ARTHROPLASTY (Right) Principal Problem:   Osteoarthritis of right knee Active Problems:   OA (osteoarthritis) of knee  Estimated body mass index is 29.6 kg/m as calculated from the following:   Height as of this encounter: 5\' 7"  (1.702 m).   Weight as of this encounter: 85.7 kg (189 lb). Advance diet Up with therapy  Anticipated LOS equal to or greater than 2 midnights due to - Age 61 and older with one or more of the following:  - Obesity  - Expected need for hospital services (PT, OT, Nursing) required for safe  discharge  - Anticipated need for postoperative skilled nursing care or inpatient rehab  - Active co-morbidities: Diabetes OR   - Unanticipated findings during/Post Surgery: None  - Patient is a high risk of re-admission due to: None    DVT Prophylaxis - Aspirin Weight bearing as tolerated. D/C O2 and pulse ox and try on room air. Hemovac pulled without difficulty, will begin therapy today.  Plan is to go Home after hospital  stay. Plan for discharge this afternoon if progresses with therapy and meeting goals. Follow-up in the office in 2 weeks with Dr. Wynelle Link.  Theresa Duty, PA-C Orthopedic Surgery 01/24/2018, 8:00 AM

## 2018-01-24 NOTE — Progress Notes (Signed)
At 1450 after the pt's second session of PT, Weston Anna, PT made me aware that the pt was okay to be discharge. At that time, the discharge orders needed to be reconciled. I notified Theresa Duty, PA for [facility] regarding the pt's completion with pt and request for D/C home. The PA complete the D/C ordered and sent the prescriptions to the pt's pharmacy. Prior to discharge, I comfirmed with the PA that the pt had out patient PT set up as there was no note or indication in the D/C paper work. Theresa Duty, reassured that the pt is set up to follow up with [facility] out patient PT following D/C. At 1600 discharge paper work and instructions were discussed/provided to the pt and his Wife. The pt and his Wife reported no further questions or concerns.

## 2018-01-24 NOTE — Progress Notes (Signed)
Physical Therapy Treatment Patient Details Name: Omar [name] MRN: [id number] DOB: May 20, 1956 Today's Date: 01/24/2018    History of Present Illness 62 yo male s/p R TKA 01/23/18.     PT Comments    Reviewed/practiced gait training and stair training. Pt has both RW and crutches available for use at home. Issued HEP for pt to perform 2x/day until he begins OP PT. All education completed. Okay to d/c from PT standpoint-made RN aware.     Follow Up Recommendations  Follow surgeon's recommendation for DC plan and follow-up therapies     Equipment Recommendations  None recommended by PT    Recommendations for Other Services       Precautions / Restrictions Precautions Precautions: Knee Restrictions Weight Bearing Restrictions: No RLE Weight Bearing: Weight bearing as tolerated    Mobility  Bed Mobility Overal bed mobility: Modified Independent                Transfers Overall transfer level: Needs assistance Equipment used: Crutches Transfers: Sit to/from Stand Sit to Stand: Supervision         General transfer comment: for safety.   Ambulation/Gait Ambulation/Gait assistance: Supervision Gait Distance (Feet): 115 Feet Assistive device: Crutches Gait Pattern/deviations: Step-to pattern     General Gait Details: Pt stated he preferred to use crutches so practiced gait training with crutches this session. VCS for safety, technique, sequencing. Pt attempted to use a NWB pattern. Repeated cues for pt to weightbear through R LE.    Stairs Stairs: Yes Stairs assistance: Min guard Stair Management: Step to pattern;Forwards;With crutches Number of Stairs: 2 General stair comments: up and over portable stesp with 2 crutches. VCS safety, technique, sequence. Close guard for safety.    Wheelchair Mobility    Modified Rankin (Stroke Patients Only)       Balance Overall balance assessment: Mild deficits observed, not formally tested                                           Cognition Arousal/Alertness: Awake/alert Behavior During Therapy: WFL for tasks assessed/performed Overall Cognitive Status: Within Functional Limits for tasks assessed                                        Exercises      General Comments        Pertinent Vitals/Pain Pain Assessment: 0-10 Pain Score: 7  Pain Location: R knee with activity Pain Descriptors / Indicators: Aching;Sore;Sharp Pain Intervention(s): Monitored during session;Repositioned;Ice applied    Home Living                      Prior Function            PT Goals (current goals can now be found in the care plan section) Progress towards PT goals: Progressing toward goals    Frequency    7X/week      PT Plan Current plan remains appropriate    Co-evaluation              AM-PAC PT "6 Clicks" Daily Activity  Outcome Measure  Difficulty turning over in bed (including adjusting bedclothes, sheets and blankets)?: None Difficulty moving from lying on back to sitting on the side of the bed? : None Difficulty sitting  down on and standing up from a chair with arms (e.g., wheelchair, bedside commode, etc,.)?: None Help needed moving to and from a bed to chair (including a wheelchair)?: A Little Help needed walking in hospital room?: A Little Help needed climbing 3-5 steps with a railing? : A Little 6 Click Score: 21    End of Session Equipment Utilized During Treatment: Gait belt Activity Tolerance: Patient tolerated treatment well Patient left: in bed;with call bell/phone within reach   PT Visit Diagnosis: Pain;Other abnormalities of gait and mobility (R26.89) Pain - Right/Left: Right Pain - part of body: Knee     Time: 4403-4742 PT Time Calculation (min) (ACUTE ONLY): 11 min  Charges:  $Gait Training: 8-22 mins                    G Codes:          Weston Anna, MPT Pager: [phone]

## 2018-01-26 NOTE — Discharge Summary (Signed)
Physician Discharge Summary   Patient ID: BROADY LAFOY MRN: [id number] DOB/AGE: 1955-09-18 62 y.o.  Admit date: 01/23/2018 Discharge date: 01/24/2018  Primary Diagnosis: osteoarthritis right knee  Admission Diagnoses:  Past Medical History:  Diagnosis Date  . Arthritis   . Complication of anesthesia    Pt states that he experienced some numbness on right side of tongue after surgery for 2 months; continues to experience gum sensitivity to date  . Depression   . Diabetes mellitus without complication ([place])   . DJD (degenerative joint disease)   . GERD (gastroesophageal reflux disease)   . Headache(784.0)    Sinus headaches  . Hyperlipidemia   . Hypertension    PATIENT DENIES    . Hypothyroidism   . Other testicular hypofunction   . Prediabetes   . Sleep apnea 2006   uses a cpap -desated to 55% during study, uses CPAP nightly  . Thyroid disease   . Tinnitus of both ears    Hx: of  . Vitamin D deficiency    Discharge Diagnoses:   Principal Problem:   Osteoarthritis of right knee Active Problems:   OA (osteoarthritis) of knee  Estimated body mass index is 29.6 kg/m as calculated from the following:   Height as of this encounter: '5\' 7"'  (1.702 m).   Weight as of this encounter: 85.7 kg (189 lb).  Procedure:  Procedure(s) (LRB): RIGHT TOTAL KNEE ARTHROPLASTY (Right)   Consults: None  HPI: Omar [name] is a 62 y.o. year old male with end stage OA of her right knee with progressively worsening pain and dysfunction. She has constant pain, with activity and at rest and significant functional deficits with difficulties even with ADLs. She has had extensive non-op management including analgesics, injections of cortisone and viscosupplements, and home exercise program, but remains in significant pain with significant dysfunction.Radiographs show bone on bone arthritis medial and patellofemoral. She presents now for right Total Knee Arthroplasty.    Laboratory  Data: Admission on 01/23/2018, Discharged on 01/24/2018  Component Date Value Ref Range Status  . Glucose-Capillary 01/23/2018 143* 70 - 99 mg/dL Final  . Glucose-Capillary 01/23/2018 205* 70 - 99 mg/dL Final  . WBC 01/24/2018 14.7* 4.0 - 10.5 K/uL Final  . RBC 01/24/2018 4.88  4.22 - 5.81 MIL/uL Final  . Hemoglobin 01/24/2018 14.1  13.0 - 17.0 g/dL Final  . HCT 01/24/2018 43.3  39.0 - 52.0 % Final  . MCV 01/24/2018 88.7  78.0 - 100.0 fL Final  . MCH 01/24/2018 28.9  26.0 - 34.0 pg Final  . MCHC 01/24/2018 32.6  30.0 - 36.0 g/dL Final  . RDW 01/24/2018 14.8  11.5 - 15.5 % Final  . Platelets 01/24/2018 284  150 - 400 K/uL Final   Performed at [facility], [place] [address] [place], [place] [address]  . Sodium 01/24/2018 141  135 - 145 mmol/L Final  . Potassium 01/24/2018 3.7  3.5 - 5.1 mmol/L Final  . Chloride 01/24/2018 106  98 - 111 mmol/L Final  . CO2 01/24/2018 28  22 - 32 mmol/L Final  . Glucose, Bld 01/24/2018 126* 70 - 99 mg/dL Final  . BUN 01/24/2018 14  6 - 20 mg/dL Final  . Creatinine, Ser 01/24/2018 0.99  0.61 - 1.24 mg/dL Final  . Calcium 01/24/2018 8.3* 8.9 - 10.3 mg/dL Final  . GFR calc non Af Amer 01/24/2018 >60  >60 mL/min Final  . GFR calc Af Amer 01/24/2018 >60  >60 mL/min Final  Comment: (NOTE) The eGFR has been calculated using the CKD EPI equation. This calculation has not been validated in all clinical situations. eGFR's persistently <60 mL/min signify possible Chronic Kidney Disease.   [name] gap 01/24/2018 7  5 - 15 Final   Performed at [facility], [place] [address], [place], [place] [address]  . Glucose-Capillary 01/23/2018 197* 70 - 99 mg/dL Final  . Glucose-Capillary 01/24/2018 122* 70 - 99 mg/dL Final  . Glucose-Capillary 01/24/2018 134* 70 - 99 mg/dL Final  Hospital Outpatient Visit on 01/16/2018  Component Date Value Ref Range Status  . Glucose-Capillary 01/16/2018 170* 70 - 99 mg/dL Final  . Hgb A1c MFr Bld  01/16/2018 6.4* 4.8 - 5.6 % Final   Comment: (NOTE) Pre diabetes:          5.7%-6.4% Diabetes:              >6.4% Glycemic control for   <7.0% adults with diabetes   . Mean Plasma Glucose 01/16/2018 136.98  mg/dL Final   Performed at [place] [address] [place], [place] [address]  . MRSA, PCR 01/16/2018 NEGATIVE  NEGATIVE Final  . Staphylococcus aureus 01/16/2018 NEGATIVE  NEGATIVE Final   Comment: (NOTE) The Xpert SA Assay (FDA approved for NASAL specimens in patients 51 years of age and older), is one component of a comprehensive surveillance program. It is not intended to diagnose infection nor to guide or monitor treatment. Performed at [facility], [place] [address] [place], [place] [address]   . aPTT 01/16/2018 25  24 - 36 seconds Final   Performed at [facility], [place] [address], [place], [place] [address]  . WBC 01/16/2018 5.0  4.0 - 10.5 K/uL Final  . RBC 01/16/2018 5.16  4.22 - 5.81 MIL/uL Final  . Hemoglobin 01/16/2018 15.1  13.0 - 17.0 g/dL Final  . HCT 01/16/2018 45.8  39.0 - 52.0 % Final  . MCV 01/16/2018 88.8  78.0 - 100.0 fL Final  . MCH 01/16/2018 29.3  26.0 - 34.0 pg Final  . MCHC 01/16/2018 33.0  30.0 - 36.0 g/dL Final  . RDW 01/16/2018 15.1  11.5 - 15.5 % Final  . Platelets 01/16/2018 269  150 - 400 K/uL Final   Performed at [facility], [place] [address], [place], [place] [address]  . Sodium 01/16/2018 141  135 - 145 mmol/L Final  . Potassium 01/16/2018 4.6  3.5 - 5.1 mmol/L Final  . Chloride 01/16/2018 109  98 - 111 mmol/L Final   Please note change in reference range.  . CO2 01/16/2018 24  22 - 32 mmol/L Final  . Glucose, Bld 01/16/2018 152* 70 - 99 mg/dL Final   Please note change in reference range.  . BUN 01/16/2018 16  6 - 20 mg/dL Final   Please note change in reference range.  . Creatinine, Ser 01/16/2018 1.27* 0.61 - 1.24 mg/dL Final  . Calcium 01/16/2018 9.5  8.9 - 10.3 mg/dL  Final  . Total Protein 01/16/2018 6.7  6.5 - 8.1 g/dL Final  . Albumin 01/16/2018 3.8  3.5 - 5.0 g/dL Final  . AST 01/16/2018 22  15 - 41 U/L Final  . ALT 01/16/2018 22  0 - 44 U/L Final   Please note change in reference range.  . Alkaline Phosphatase 01/16/2018 37* 38 - 126 U/L Final  . Total Bilirubin 01/16/2018 0.6  0.3 - 1.2 mg/dL Final  . GFR calc non Af  Amer 01/16/2018 60* >60 mL/min Final  . GFR calc Af Amer 01/16/2018 >60  >60 mL/min Final   Comment: (NOTE) The eGFR has been calculated using the CKD EPI equation. This calculation has not been validated in all clinical situations. eGFR's persistently <60 mL/min signify possible Chronic Kidney Disease.   [name] gap 01/16/2018 8  5 - 15 Final   Performed at [facility], [place] [address] [place], [place] [address]  . Prothrombin Time 01/16/2018 12.0  11.4 - 15.2 seconds Final  . INR 01/16/2018 0.89   Final   Performed at [facility], [place] [address] [place], [place] [address]  . ABO/RH(D) 01/16/2018 O POS   Final  . Antibody Screen 01/16/2018 NEG   Final  . Sample Expiration 01/16/2018 01/26/2018   Final  . Extend sample reason 01/16/2018    Final                   Value:NO TRANSFUSIONS OR PREGNANCY IN THE PAST 3 MONTHS Performed at [facility], [place] [address] [place], [place] [address]   . Color, Urine 01/16/2018 YELLOW  YELLOW Final  . APPearance 01/16/2018 CLEAR  CLEAR Final  . Specific Gravity, Urine 01/16/2018 1.019  1.005 - 1.030 Final  . pH 01/16/2018 5.0  5.0 - 8.0 Final  . Glucose, UA 01/16/2018 150* NEGATIVE mg/dL Final  . Hgb urine dipstick 01/16/2018 NEGATIVE  NEGATIVE Final  . Bilirubin Urine 01/16/2018 NEGATIVE  NEGATIVE Final  . Ketones, ur 01/16/2018 5* NEGATIVE mg/dL Final  . Protein, ur 01/16/2018 NEGATIVE  NEGATIVE mg/dL Final  . Nitrite 01/16/2018 NEGATIVE  NEGATIVE Final  . Leukocytes, UA 01/16/2018 NEGATIVE  NEGATIVE Final   Performed at  [facility], [place] [address] [place], [place] [address]  . ABO/RH(D) 01/16/2018    Final                   Value:O POS Performed at [facility], [place] [address], [place], [place] [address]      X-Rays:No results found.  EKG: Orders placed or performed in visit on 08/01/17  . EKG 12-Lead     Hospital Course: Omar [name] is a 62 y.o. who was admitted to [facility]. They were brought to the operating room on 01/23/2018 and underwent Procedure(s): RIGHT TOTAL KNEE ARTHROPLASTY.  Patient tolerated the procedure well and was later transferred to the recovery room and then to the orthopaedic floor for postoperative care.  They were given PO and IV analgesics for pain control following their surgery.  They were given 24 hours of postoperative antibiotics of  Anti-infectives (From admission, onward)   Start     Dose/Rate Route Frequency Ordered Stop   01/23/18 1800  ceFAZolin (ANCEF) IVPB 2g/100 mL premix     2 g 200 mL/hr over 30 Minutes Intravenous Every 6 hours 01/23/18 1523 01/23/18 2337   01/23/18 1131  ceFAZolin (ANCEF) 2-4 GM/100ML-% IVPB    Note to Pharmacy:  Carleene Cooper   : cabinet override      01/23/18 1131 01/23/18 1127   01/23/18 0915  ceFAZolin (ANCEF) IVPB 2g/100 mL premix     2 g 200 mL/hr over 30 Minutes Intravenous On call to O.R. 01/23/18 0901 01/23/18 1147     and started on DVT prophylaxis in the form of Aspirin.   PT and OT were ordered for total joint protocol.  Discharge planning consulted to help with postop  disposition and equipment needs.  Patient had a good night on the evening of surgery. Patient was seen on POD #1 during rounds and was ready to go home pending progress with therapy. Hemovac drain was pulled without difficulty. Completed two sessions with PT and was meeting his goals. He was discharged to home later that day in stable condition. Instructed patient to change dressing on POD #2 and that he may  shower on POD #3.   Diet: Diabetic diet Activity:WBAT Follow-up:in 2 weeks with Dr. Wynelle Link Disposition - Home with outpatient physical therapy at [facility] Discharged Condition: stable   Discharge Instructions    Call MD / Call 911   Complete by:  As directed    If you experience chest pain or shortness of breath, CALL 911 and be transported to the hospital emergency room.  If you develope a fever above 101 F, pus (white drainage) or increased drainage or redness at the wound, or calf pain, call your surgeon's office.   Change dressing   Complete by:  As directed    Change dressing on Wednesday (01/25/2018), then change the dressing daily with sterile 4 x 4 inch gauze dressing and apply TED hose.   Constipation Prevention   Complete by:  As directed    Drink plenty of fluids.  Prune juice may be helpful.  You may use a stool softener, such as Colace (over the counter) 100 mg twice a day.  Use MiraLax (over the counter) for constipation as needed.   Diet - low sodium heart healthy   Complete by:  As directed    Discharge instructions   Complete by:  As directed    Dr. Gaynelle Arabian Total Joint Specialist [facility] [address], [place], [place] [address] [phone]  TOTAL KNEE REPLACEMENT POSTOPERATIVE DIRECTIONS  Knee Rehabilitation, Guidelines Following Surgery  Results after knee surgery are often greatly improved when you follow the exercise, range of motion and muscle strengthening exercises prescribed by your doctor. Safety measures are also important to protect the knee from further injury. Any time any of these exercises cause you to have increased pain or swelling in your knee joint, decrease the amount until you are comfortable again and slowly increase them. If you have problems or questions, call your caregiver or physical therapist for advice.   HOME CARE INSTRUCTIONS  Remove items at home which could result in a fall. This includes throw rugs or  furniture in walking pathways.  ICE to the affected knee every three hours for 30 minutes at a time and then as needed for pain and swelling.  Continue to use ice on the knee for pain and swelling from surgery. You may notice swelling that will progress down to the foot and ankle.  This is normal after surgery.  Elevate the leg when you are not up walking on it.   Continue to use the breathing machine which will help keep your temperature down.  It is common for your temperature to cycle up and down following surgery, especially at night when you are not up moving around and exerting yourself.  The breathing machine keeps your lungs expanded and your temperature down. Do not place pillow under knee, focus on keeping the knee straight while resting   DIET You may resume your previous home diet once your are discharged from the hospital.  DRESSING / WOUND CARE / SHOWERING You may shower 3 days after surgery, but keep the wounds dry during showering.  You  may use an occlusive plastic wrap (Press'n Seal for example), NO SOAKING/SUBMERGING IN THE BATHTUB.  If the bandage gets wet, change with a clean dry gauze.  If the incision gets wet, pat the wound dry with a clean towel. You may start showering once you are discharged home but do not submerge the incision under water. Just pat the incision dry and apply a dry gauze dressing on daily. Change the surgical dressing daily and reapply a dry dressing each time.  ACTIVITY Walk with your walker as instructed. Use walker as long as suggested by your caregivers. Avoid periods of inactivity such as sitting longer than an hour when not asleep. This helps prevent blood clots.  You may resume a sexual relationship in one month or when given the OK by your doctor.  You may return to work once you are cleared by your doctor.  Do not drive a car for 6 weeks or until released by you surgeon.  Do not drive while taking narcotics.  WEIGHT BEARING Weight bearing as  tolerated with assist device (walker, cane, etc) as directed, use it as long as suggested by your surgeon or therapist, typically at least 4-6 weeks.  POSTOPERATIVE CONSTIPATION PROTOCOL Constipation - defined medically as fewer than three stools per week and severe constipation as less than one stool per week.  One of the most common issues patients have following surgery is constipation.  Even if you have a regular bowel pattern at home, your normal regimen is likely to be disrupted due to multiple reasons following surgery.  Combination of anesthesia, postoperative narcotics, change in appetite and fluid intake all can affect your bowels.  In order to avoid complications following surgery, here are some recommendations in order to help you during your recovery period.  Colace (docusate) - Pick up an over-the-counter form of Colace or another stool softener and take twice a day as long as you are requiring postoperative pain medications.  Take with a full glass of water daily.  If you experience loose stools or diarrhea, hold the colace until you stool forms back up.  If your symptoms do not get better within 1 week or if they get worse, check with your doctor.  Dulcolax (bisacodyl) - Pick up over-the-counter and take as directed by the product packaging as needed to assist with the movement of your bowels.  Take with a full glass of water.  Use this product as needed if not relieved by Colace only.   MiraLax (polyethylene glycol) - Pick up over-the-counter to have on hand.  MiraLax is a solution that will increase the amount of water in your bowels to assist with bowel movements.  Take as directed and can mix with a glass of water, juice, soda, coffee, or tea.  Take if you go more than two days without a movement. Do not use MiraLax more than once per day. Call your doctor if you are still constipated or irregular after using this medication for 7 days in a row.  If you continue to have problems  with postoperative constipation, please contact the office for further assistance and recommendations.  If you experience "the worst abdominal pain ever" or develop nausea or vomiting, please contact the office immediatly for further recommendations for treatment.  ITCHING  If you experience itching with your medications, try taking only a single pain pill, or even half a pain pill at a time.  You can also use Benadryl over the counter for itching or also to  help with sleep.   TED HOSE STOCKINGS Wear the elastic stockings on both legs for three weeks following surgery during the day but you may remove then at night for sleeping.  MEDICATIONS See your medication summary on the "After Visit Summary" that the nursing staff will review with you prior to discharge.  You may have some home medications which will be placed on hold until you complete the course of blood thinner medication.  It is important for you to complete the blood thinner medication as prescribed by your surgeon.  Continue your approved medications as instructed at time of discharge.  PRECAUTIONS If you experience chest pain or shortness of breath - call 911 immediately for transfer to the hospital emergency department.  If you develop a fever greater that 101 F, purulent drainage from wound, increased redness or drainage from wound, foul odor from the wound/dressing, or calf pain - CONTACT YOUR SURGEON.                                                   FOLLOW-UP APPOINTMENTS Make sure you keep all of your appointments after your operation with your surgeon and caregivers. You should call the office at the above phone number and make an appointment for approximately two weeks after the date of your surgery or on the date instructed by your surgeon outlined in the "After Visit Summary".   RANGE OF MOTION AND STRENGTHENING EXERCISES  Rehabilitation of the knee is important following a knee injury or an operation. After just a few days  of immobilization, the muscles of the thigh which control the knee become weakened and shrink (atrophy). Knee exercises are designed to build up the tone and strength of the thigh muscles and to improve knee motion. Often times heat used for twenty to thirty minutes before working out will loosen up your tissues and help with improving the range of motion but do not use heat for the first two weeks following surgery. These exercises can be done on a training (exercise) mat, on the floor, on a table or on a bed. Use what ever works the best and is most comfortable for you Knee exercises include:  Leg Lifts - While your knee is still immobilized in a splint or cast, you can do straight leg raises. Lift the leg to 60 degrees, hold for 3 sec, and slowly lower the leg. Repeat 10-20 times 2-3 times daily. Perform this exercise against resistance later as your knee gets better.  Quad and Hamstring Sets - Tighten up the muscle on the front of the thigh (Quad) and hold for 5-10 sec. Repeat this 10-20 times hourly. Hamstring sets are done by pushing the foot backward against an object and holding for 5-10 sec. Repeat as with quad sets.  Leg Slides: Lying on your back, slowly slide your foot toward your buttocks, bending your knee up off the floor (only go as far as is comfortable). Then slowly slide your foot back down until your leg is flat on the floor again. Angel Wings: Lying on your back spread your legs to the side as far apart as you can without causing discomfort.  A rehabilitation program following serious knee injuries can speed recovery and prevent re-injury in the future due to weakened muscles. Contact your doctor or a physical therapist for more information on knee  rehabilitation.   IF YOU ARE TRANSFERRED TO A SKILLED REHAB FACILITY If the patient is transferred to a skilled rehab facility following release from the hospital, a list of the current medications will be sent to the facility for the patient  to continue.  When discharged from the skilled rehab facility, please have the facility set up the patient's [place] prior to being released. Also, the skilled facility will be responsible for providing the patient with their medications at time of release from the facility to include their pain medication, the muscle relaxants, and their blood thinner medication. If the patient is still at the rehab facility at time of the two week follow up appointment, the skilled rehab facility will also need to assist the patient in arranging follow up appointment in our office and any transportation needs.  MAKE SURE YOU:  Understand these instructions.  Get help right away if you are not doing well or get worse.    Pick up stool softner and laxative for home use following surgery while on pain medications. Do not submerge incision under water. Please use good hand washing techniques while changing dressing each day. May shower starting three days after surgery. Please use a clean towel to pat the incision dry following showers. Continue to use ice for pain and swelling after surgery. Do not use any lotions or creams on the incision until instructed by your surgeon.   Do not put a pillow under the knee. Place it under the heel.   Complete by:  As directed    Driving restrictions   Complete by:  As directed    No driving for two weeks   TED hose   Complete by:  As directed    Use stockings (TED hose) for three weeks on both leg(s).  You may remove them at night for sleeping.   Weight bearing as tolerated   Complete by:  As directed      Allergies as of 01/24/2018   No Known Allergies     Medication List    STOP taking these medications   GLUCOSAMINE-MSM-HYALURONIC ACD PO     TAKE these medications   Alogliptin Benzoate 25 MG Tabs Take 25 mg by mouth daily.   aspirin 325 MG EC tablet Take 1 tablet (325 mg total) by mouth 2 (two) times daily for 20 days. Then take one  81 mg aspirin once a day for 3 weeks.   atorvastatin 80 MG tablet Commonly known as:  LIPITOR Take 80 mg by mouth every evening.   buPROPion 150 MG 12 hr tablet Commonly known as:  WELLBUTRIN SR Take 1 tablet (150 mg total) by mouth 2 (two) times daily.   esomeprazole 40 MG capsule Commonly known as:  NEXIUM Take 40 mg by mouth 2 (two) times daily.   fenofibrate 160 MG tablet Take 160 mg by mouth daily.   fenofibrate 145 MG tablet Commonly known as:  TRICOR Take 1 tablet (145 mg total) by mouth daily.   finasteride 5 MG tablet Commonly known as:  PROSCAR Take 1 tablet (5 mg total) by mouth daily. What changed:    how much to take  when to take this   gabapentin 300 MG capsule Commonly known as:  NEURONTIN Take 1 capsule (300 mg total) by mouth 3 (three) times daily.   levothyroxine 150 MCG tablet Commonly known as:  SYNTHROID, LEVOTHROID Take 150 mcg by mouth daily.   loratadine-pseudoephedrine 10-240 MG 24 hr tablet Commonly  known as:  CLARITIN-D 24-hour Take 1 tablet by mouth daily as needed for allergies.   LUBRICATING EYE DROPS OP Apply 1 drop to eye daily as needed (dry eyes).   metFORMIN 500 MG 24 hr tablet Commonly known as:  GLUCOPHAGE-XR TAKE 1 TABLET TWICE DAILY WITH BREAKFAST AND LUNCH, AND 2 TABLETS WITH SUPPER What changed:  See the new instructions.   methocarbamol 500 MG tablet Commonly known as:  ROBAXIN Take 1 tablet (500 mg total) by mouth every 6 (six) hours as needed for muscle spasms.   multivitamin with minerals Tabs tablet Take 1 tablet by mouth daily.   OMEGA 3-6-9 COMPLEX Caps Take 1 capsule by mouth 2 (two) times daily.   Oxycodone HCl 10 MG Tabs Take 1-1.5 tablets (10-15 mg total) by mouth every 4 (four) hours as needed for severe pain (pain score 7-10).   tadalafil 20 MG tablet Commonly known as:  CIALIS 1 tablet every 2 to 3 days for XXXX   testosterone cypionate 200 MG/ML injection Commonly known as:   DEPO-TESTOSTERONE Inject 2 mLs (400 mg total) into the muscle every 14 (fourteen) days. What changed:  how much to take   traMADol 50 MG tablet Commonly known as:  ULTRAM Take 1-2 tablets (50-100 mg total) by mouth every 6 (six) hours as needed for moderate pain.   Vitamin D3 5000 units Caps Take 5,000 Units by mouth every evening.            Discharge Care Instructions  (From admission, onward)        Start     Ordered   01/24/18 0000  Weight bearing as tolerated     01/24/18 1429   01/24/18 0000  Change dressing    Comments:  Change dressing on Wednesday (01/25/2018), then change the dressing daily with sterile 4 x 4 inch gauze dressing and apply TED hose.   01/24/18 1429     Follow-up Information    Gaynelle Arabian, MD. Schedule an appointment as soon as possible for a visit on 02/07/2018.   Specialty:  Orthopedic Surgery Contact information: [address] [place] [address] [phone]           Signed: Theresa Duty, PA-C Orthopaedic Surgery 01/26/2018, 3:42 PM

## 2018-02-09 ENCOUNTER — Ambulatory Visit (INDEPENDENT_AMBULATORY_CARE_PROVIDER_SITE_OTHER): Payer: BLUE CROSS/BLUE SHIELD | Admitting: Internal Medicine

## 2018-02-09 ENCOUNTER — Encounter: Payer: Self-pay | Admitting: Internal Medicine

## 2018-02-09 VITALS — BP 116/70 | HR 88 | Temp 97.8°F | Resp 16 | Ht 67.0 in | Wt 187.8 lb

## 2018-02-09 DIAGNOSIS — E559 Vitamin D deficiency, unspecified: Secondary | ICD-10-CM | POA: Diagnosis not present

## 2018-02-09 DIAGNOSIS — E1122 Type 2 diabetes mellitus with diabetic chronic kidney disease: Secondary | ICD-10-CM

## 2018-02-09 DIAGNOSIS — E079 Disorder of thyroid, unspecified: Secondary | ICD-10-CM

## 2018-02-09 DIAGNOSIS — Z79899 Other long term (current) drug therapy: Secondary | ICD-10-CM | POA: Diagnosis not present

## 2018-02-09 DIAGNOSIS — N182 Chronic kidney disease, stage 2 (mild): Secondary | ICD-10-CM

## 2018-02-09 DIAGNOSIS — E782 Mixed hyperlipidemia: Secondary | ICD-10-CM | POA: Diagnosis not present

## 2018-02-09 DIAGNOSIS — I10 Essential (primary) hypertension: Secondary | ICD-10-CM | POA: Diagnosis not present

## 2018-02-09 NOTE — Progress Notes (Signed)
This very nice 62 y.o. MWM presents for 6 month follow up with HTN, HLD, Pre-Diabetes and Vitamin D Deficiency. He also has OSA/on CPAP with improved sleep hygiene.      Patient is treated for HTN (2008) & BP has been controlled at home. Today's BP is at goal - 116/70. In 2011, he had a False Positive Cardiolite confirmed by a Nl Heart Cath. Patient has had no complaints of any cardiac type chest pain, palpitations, dyspnea / orthopnea / PND, dizziness, claudication, or dependent edema.     Hyperlipidemia is controlled with diet & meds. Patient denies myalgias or other med SE's. Last Lipids were at goal albeit elevated Trig's: Lab Results  Component Value Date   CHOL 126 02/09/2018   HDL 45 02/09/2018   LDLCALC 58 02/09/2018   TRIG 148 02/09/2018   CHOLHDL 2.8 02/09/2018      Also, the patient has history of T2_NIDDM (2014)  w/CKD2 and has had no symptoms of reactive hypoglycemia, diabetic polys, paresthesias or visual blurring.  Last A1c was not at goal: Lab Results  Component Value Date   HGBA1C 6.4 (H) 02/09/2018      Patient has been on Testosterone replacement with improved sense of well being. Patient has been on Thyroid replacement post RAI-131 Tx for Grave's Dz circa 2000.      Further, the patient also has history of Vitamin D Deficiency  ("27"/2008) and supplements vitamin D without any suspected side-effects. Last vitamin D was at goal: Lab Results  Component Value Date   VD25OH 52 02/09/2018   Current Outpatient Medications on File Prior to Visit  Medication Sig  . Alogliptin Benzoate 25 MG TABS Take 25 mg by mouth daily.  Marland Kitchen aspirin EC 325 MG EC tablet Take 1 tablet (325 mg total) by mouth 2 (two) times daily for 20 days. Then take one 81 mg aspirin once a day for 3 weeks.  Marland Kitchen atorvastatin (LIPITOR) 80 MG tablet Take 80 mg by mouth every evening.   Marland Kitchen buPROPion (WELLBUTRIN SR) 150 MG 12 hr tablet Take 1 tablet (150 mg total) by mouth 2 (two) times daily.  .  Carboxymethylcellul-Glycerin (LUBRICATING EYE DROPS OP) Apply 1 drop to eye daily as needed (dry eyes).  . Cholecalciferol (VITAMIN D3) 5000 units CAPS Take 5,000 Units by mouth every evening.  . cyclobenzaprine (FLEXERIL) 10 MG tablet Take 10 mg by mouth 3 (three) times daily as needed for muscle spasms.  Marland Kitchen esomeprazole (NEXIUM) 40 MG capsule Take 40 mg by mouth 2 (two) times daily.  . fenofibrate (TRICOR) 145 MG tablet Take 1 tablet (145 mg total) by mouth daily.  . fenofibrate 160 MG tablet Take 160 mg by mouth daily.  . finasteride (PROSCAR) 5 MG tablet Take 1 tablet (5 mg total) by mouth daily. (Patient taking differently: Take 2.5 mg by mouth every evening. )  . gabapentin (NEURONTIN) 300 MG capsule Take 1 capsule (300 mg total) by mouth 3 (three) times daily.  Marland Kitchen levothyroxine (SYNTHROID, LEVOTHROID) 150 MCG tablet Take 150 mcg by mouth daily.  Marland Kitchen loratadine-pseudoephedrine (CLARITIN-D 24-HOUR) 10-240 MG 24 hr tablet Take 1 tablet by mouth daily as needed for allergies.  . metFORMIN (GLUCOPHAGE-XR) 500 MG 24 hr tablet TAKE 1 TABLET TWICE DAILY WITH BREAKFAST AND LUNCH, AND 2 TABLETS WITH SUPPER (Patient taking differently: Take 500 mg by mouth twice daily)  . Multiple Vitamin (MULTIVITAMIN WITH MINERALS) TABS tablet Take 1 tablet by mouth daily.  Ernestine Conrad 3-6-9  Fatty Acids (OMEGA 3-6-9 COMPLEX) CAPS Take 1 capsule by mouth 2 (two) times daily.  Marland Kitchen oxyCODONE 10 MG TABS Take 1-1.5 tablets (10-15 mg total) by mouth every 4 (four) hours as needed for severe pain (pain score 7-10).  . tadalafil (CIALIS) 20 MG tablet 1 tablet every 2 to 3 days for XXXX  . testosterone cypionate (DEPO-TESTOSTERONE) 200 MG/ML injection Inject 2 mLs (400 mg total) into the muscle every 14 (fourteen) days. (Patient taking differently: Inject 200 mg into the muscle every 14 (fourteen) days. )  . traMADol (ULTRAM) 50 MG tablet Take 1-2 tablets (50-100 mg total) by mouth every 6 (six) hours as needed for moderate pain.   No  current facility-administered medications on file prior to visit.    No Known Allergies PMHx:   Past Medical History:  Diagnosis Date  . Arthritis   . Complication of anesthesia    Pt states that he experienced some numbness on right side of tongue after surgery for 2 months; continues to experience gum sensitivity to date  . Depression   . Diabetes mellitus without complication ([facility])   . DJD (degenerative joint disease)   . GERD (gastroesophageal reflux disease)   . Headache(784.0)    Sinus headaches  . Hyperlipidemia   . Hypertension    PATIENT DENIES    . Hypothyroidism   . Other testicular hypofunction   . Prediabetes   . Sleep apnea 2006   uses a cpap -desated to 55% during study, uses CPAP nightly  . Thyroid disease   . Tinnitus of both ears    Hx: of  . Vitamin D deficiency    Immunization History  Administered Date(s) Administered  . Influenza Split 04/18/2013, 05/05/2016, 05/11/2017  . Influenza, Seasonal, Injecte, Preservative Fre 06/16/2015  . Pneumococcal Polysaccharide-23 05/14/2004, 04/12/2011  . Tdap 04/12/2011   Past Surgical History:  Procedure Laterality Date  . CARDIAC CATHETERIZATION  03/30/2010   normal coronaries, RCA takeoff of the left coronary cusp, EF 55% [facility])  . CARPAL TUNNEL RELEASE Right 01/12/2016   Procedure: CARPAL TUNNEL RELEASE;  Surgeon: Tania Ade, MD;  Location: [place];  Service: Orthopedics;  Laterality: Right;  Right carpal tunnel release  . COLONOSCOPY     x2  . KNEE ARTHROPLASTY Left 10/16/2012   Procedure: LEFT COMPUTER ASSISTED TOTAL KNEE ARTHROPLASTY;  Surgeon: Alta Corning, MD;  Location: [place];  Service: Orthopedics;  Laterality: Left;  . KNEE ARTHROSCOPY  05/12/2012   Procedure: ARTHROSCOPY KNEE;  Surgeon: Alta Corning, MD;  Location: [place];  Service: Orthopedics;  Laterality: Left;  . KNEE SURGERY    . MOUTH SURGERY    . TOTAL KNEE ARTHROPLASTY Left 10/16/2012   Dr  Berenice Primas  . TOTAL KNEE ARTHROPLASTY Right 01/23/2018   Procedure: RIGHT TOTAL KNEE ARTHROPLASTY;  Surgeon: Gaynelle Arabian, MD;  Location: [facility];  Service: Orthopedics;  Laterality: Right;  . UPPER GI ENDOSCOPY     x2   FHx:    Reviewed / unchanged  SHx:    Reviewed / unchanged   Systems Review:  Constitutional: Denies fever, chills, wt changes, headaches, insomnia, fatigue, night sweats, change in appetite. Eyes: Denies redness, blurred vision, diplopia, discharge, itchy, watery eyes.  ENT: Denies discharge, congestion, post nasal drip, epistaxis, sore throat, earache, hearing loss, dental pain, tinnitus, vertigo, sinus pain, snoring.  CV: Denies chest pain, palpitations, irregular heartbeat, syncope, dyspnea, diaphoresis, orthopnea, PND, claudication or edema. Respiratory: denies cough, dyspnea, DOE, pleurisy, hoarseness,  laryngitis, wheezing.  Gastrointestinal: Denies dysphagia, odynophagia, heartburn, reflux, water brash, abdominal pain or cramps, nausea, vomiting, bloating, diarrhea, constipation, hematemesis, melena, hematochezia  or hemorrhoids. Genitourinary: Denies dysuria, frequency, urgency, nocturia, hesitancy, discharge, hematuria or flank pain. Musculoskeletal: Denies arthralgias, myalgias, stiffness, jt. swelling, pain, limping or strain/sprain.  Skin: Denies pruritus, rash, hives, warts, acne, eczema or change in skin lesion(s). Neuro: No weakness, tremor, incoordination, spasms, paresthesia or pain. Psychiatric: Denies confusion, memory loss or sensory loss. Endo: Denies change in weight, skin or hair change.  Heme/Lymph: No excessive bleeding, bruising or enlarged lymph nodes.  Physical Exam  BP 116/70   Pulse 88   Temp 97.8 F (36.6 C)   Resp 16   Ht 5\' 7"  (1.702 m)   Wt 187 lb 12.8 oz (85.2 kg)   BMI 29.41 kg/m   Appears  Over nourished, well groomed  and in no distress.  Eyes: PERRLA, EOMs, conjunctiva no swelling or erythema. Sinuses: No frontal/maxillary  tenderness ENT/Mouth: EAC's clear, TM's nl w/o erythema, bulging. Nares clear w/o erythema, swelling, exudates. Oropharynx clear without erythema or exudates. Oral hygiene is good. Tongue normal, non obstructing. Hearing intact.  Neck: Supple. Thyroid not palpable. Car 2+/2+ without bruits, nodes or JVD. Chest: Respirations nl with BS clear & equal w/o rales, rhonchi, wheezing or stridor.  Cor: Heart sounds normal w/ regular rate and rhythm without sig. murmurs, gallops, clicks or rubs. Peripheral pulses normal and equal  without edema.  Abdomen: Soft & bowel sounds normal. Non-tender w/o guarding, rebound, hernias, masses or organomegaly.  Lymphatics: Unremarkable.  Musculoskeletal: Full ROM all peripheral extremities, joint stability, 5/5 strength and normal gait.  Skin: Warm, dry without exposed rashes, lesions or ecchymosis apparent.  Neuro: Cranial nerves intact, reflexes equal bilaterally. Sensory-motor testing grossly intact. Tendon reflexes grossly intact.  Pysch: Alert & oriented x 3.  Insight and judgement nl & appropriate. No ideations.  Assessment and Plan:  1. Essential hypertension  - Continue medication, monitor blood pressure at home.  - Continue DASH diet.  Reminder to go to the ER if any CP,  SOB, nausea, dizziness, severe HA, changes vision/speech.  - CBC with Differential/Platelet - COMPLETE METABOLIC PANEL WITH GFR - Magnesium - TSH  2. Hyperlipidemia, mixed  - Continue diet/meds, exercise,& lifestyle modifications.  - Continue monitor periodic cholesterol/liver & renal functions   - Lipid panel - TSH  3. Type 2 diabetes mellitus with stage 2 chronic kidney disease, without long-term current use of insulin (HCC)  - Continue diet, exercise, lifestyle modifications.  - Monitor appropriate labs.  - Hemoglobin A1c - Insulin, random  4. Vitamin D deficiency  - Continue supplementation.   - VITAMIN D 25 Hydroxyl  5. Hypothyroidism  - TSH  6.  Medication management  - CBC with Differential/Platelet - COMPLETE METABOLIC PANEL WITH GFR - Magnesium - Lipid panel - TSH - Hemoglobin A1c - Insulin, random - VITAMIN D 25 Hydroxyl      Discussed  regular exercise, BP monitoring, weight control to achieve/maintain BMI less than 25 and discussed med and SE's. Recommended labs to assess and monitor clinical status with further disposition pending results of labs. Over 30 minutes of exam, counseling, chart review was performed.

## 2018-02-09 NOTE — Patient Instructions (Signed)

## 2018-02-10 LAB — LIPID PANEL
Cholesterol: 126 mg/dL (ref <200)
HDL: 45 mg/dL (ref >40)
LDL Cholesterol (Calc): 58 mg/dL (calc)
Non-HDL Cholesterol (Calc): 81 mg/dL (calc) (ref <130)
Total CHOL/HDL Ratio: 2.8 (calc) (ref <5.0)
Triglycerides: 148 mg/dL (ref <150)

## 2018-02-10 LAB — CBC WITH DIFFERENTIAL/PLATELET
Basophils Absolute: 113 cells/uL (ref 0–200)
Basophils Relative: 1.5 %
Eosinophils Absolute: 383 cells/uL (ref 15–500)
Eosinophils Relative: 5.1 %
HCT: 43.4 % (ref 38.5–50.0)
Hemoglobin: 14.3 g/dL (ref 13.2–17.1)
Lymphs Abs: 2423 cells/uL (ref 850–3900)
MCH: 28.3 pg (ref 27.0–33.0)
MCHC: 32.9 g/dL (ref 32.0–36.0)
MCV: 85.8 fL (ref 80.0–100.0)
MPV: 9.4 fL (ref 7.5–12.5)
Monocytes Relative: 8.8 %
Neutro Abs: 3923 cells/uL (ref 1500–7800)
Neutrophils Relative %: 52.3 %
Platelets: 487 10*3/uL — ABNORMAL HIGH (ref 140–400)
RBC: 5.06 10*6/uL (ref 4.20–5.80)
RDW: 13 % (ref 11.0–15.0)
Total Lymphocyte: 32.3 %
WBC mixed population: 660 cells/uL (ref 200–950)
WBC: 7.5 10*3/uL (ref 3.8–10.8)

## 2018-02-10 LAB — COMPLETE METABOLIC PANEL WITH GFR
AG Ratio: 1.5 (calc) (ref 1.0–2.5)
ALT: 26 U/L (ref 9–46)
AST: 21 U/L (ref 10–35)
Albumin: 4.2 g/dL (ref 3.6–5.1)
Alkaline phosphatase (APISO): 56 U/L (ref 40–115)
BUN: 12 mg/dL (ref 7–25)
CO2: 33 mmol/L — ABNORMAL HIGH (ref 20–32)
Calcium: 9.6 mg/dL (ref 8.6–10.3)
Chloride: 101 mmol/L (ref 98–110)
Creat: 1.07 mg/dL (ref 0.70–1.25)
GFR, Est African American: 87 mL/min/{1.73_m2} (ref >=60)
GFR, Est Non African American: 75 mL/min/{1.73_m2} (ref >=60)
Globulin: 2.8 g/dL (calc) (ref 1.9–3.7)
Glucose, Bld: 62 mg/dL — ABNORMAL LOW (ref 65–99)
Potassium: 4 mmol/L (ref 3.5–5.3)
Sodium: 141 mmol/L (ref 135–146)
Total Bilirubin: 0.3 mg/dL (ref 0.2–1.2)
Total Protein: 7 g/dL (ref 6.1–8.1)

## 2018-02-10 LAB — HEMOGLOBIN A1C
Hgb A1c MFr Bld: 6.4 % of total Hgb — ABNORMAL HIGH (ref <5.7)
Mean Plasma Glucose: 137 (calc)
eAG (mmol/L): 7.6 (calc)

## 2018-02-10 LAB — TSH: TSH: 5.16 mIU/L — ABNORMAL HIGH (ref 0.40–4.50)

## 2018-02-10 LAB — MAGNESIUM: Magnesium: 2 mg/dL (ref 1.5–2.5)

## 2018-02-10 LAB — VITAMIN D 25 HYDROXY (VIT D DEFICIENCY, FRACTURES): Vit D, 25-Hydroxy: 52 ng/mL (ref 30–100)

## 2018-02-10 LAB — INSULIN, RANDOM: Insulin: 14.6 u[IU]/mL (ref 2.0–19.6)

## 2018-02-12 ENCOUNTER — Encounter: Payer: Self-pay | Admitting: Internal Medicine

## 2018-02-23 ENCOUNTER — Ambulatory Visit (INDEPENDENT_AMBULATORY_CARE_PROVIDER_SITE_OTHER): Payer: BLUE CROSS/BLUE SHIELD | Admitting: Internal Medicine

## 2018-02-23 VITALS — BP 116/88 | HR 104 | Temp 97.9°F | Resp 18 | Ht 67.0 in | Wt 175.8 lb

## 2018-02-23 DIAGNOSIS — R110 Nausea: Secondary | ICD-10-CM

## 2018-02-23 DIAGNOSIS — R531 Weakness: Secondary | ICD-10-CM

## 2018-02-23 DIAGNOSIS — M25562 Pain in left knee: Secondary | ICD-10-CM | POA: Diagnosis not present

## 2018-02-23 DIAGNOSIS — R634 Abnormal weight loss: Secondary | ICD-10-CM

## 2018-02-23 DIAGNOSIS — M25462 Effusion, left knee: Secondary | ICD-10-CM

## 2018-02-23 DIAGNOSIS — Z79899 Other long term (current) drug therapy: Secondary | ICD-10-CM

## 2018-02-23 MED ORDER — GABAPENTIN 600 MG PO TABS: ORAL_TABLET | ORAL | 1 refills | Status: DC

## 2018-02-23 NOTE — Progress Notes (Signed)
Subjective:    Patient ID: Omar [name], male    DOB: 12-11-1955, 62 y.o.   MRN: [id number]  HPI   This nice 62 yo MWM with HTN, HLD, Pre-Diabetes and Vitamin D Deficiency. He also has OSA/CPAP  Presents with c/o Nausea and loose to mucoid stools. He has had a 12# weight loss over the last 2 weeks as he does endorse nausea precluding food intake. He denies any abdominal pains , cramping, heartburn or  Bloating. Denies fever, chills, rash or Respiratory or GU sx's.   Medication Sig  . Alogliptin Benzoate 25 MG TABS Take  daily.  Marland Kitchen atorvastatin  80 MG tablet Take every evening.   Marland Kitchen buPROPionSR) 150 MG 12 hr tablet Take 1 tablet  2 times daily.  . LUBRICATING EYE DROPS Apply 1 drop to eye daily as needed (dry eyes).  Marland Kitchen VITAMIN D 5000 units  Take 5,000 Units every evening.  . cyclobenzaprine 10 MG tablet Take  3  times daily as needed for muscle spasms.  Marland Kitchen esomeprazole  40 MG capsule Take  2  times daily.  . fenofibrate145 MG tablet Take 1 tablet daily.  . fenofibrate 160 MG tablet Take 160 mg  daily.  . finasteride  5 MG tablet Take 2.5 mg  every evening  . levothyroxine 150 MCG tablet Take 150 mcg by mouth daily.  Marland Kitchen CLARITIN-D 24-Hr 10-240 MG  Take 1 tablet  daily as needed for allergies.  . metFORMIN -XR 500 MG 24 hr tablet  Take 500 mg by mouth twice daily)  . Multi-Vit  w/ MINERALS Take 1 tablet daily.  Ernestine Conrad 3-6-9  CAPS Take 1 capsule by mouth 2 (two) times daily.  Marland Kitchen testosterone cypio 200 MG/ML injection Injec 2 mLs (400 mg total) IM every 14 days  . traMADol (ULTRAM) 50 MG tablet Take 1-2 tablets (50-100 mg total) by mouth every 6 (six) hours as needed for moderate pain.  Marland Kitchen gabapentin (NEURONTIN) 300 MG capsule not taking  . oxyCODONE 10 MG TABS Take 1-1.5 tabletsevery 4 hours as needed for severe pain   . tadalafil (CIALIS) 20 MG tablet 1 tablet every 2 to 3 days for XXXX   No Known Allergies   Past Medical History:  Diagnosis Date  . Arthritis   . Complication of  anesthesia    Pt states that he experienced some numbness on right side of tongue after surgery for 2 months; continues to experience gum sensitivity to date  . Depression   . Diabetes mellitus without complication ([place])   . DJD (degenerative joint disease)   . GERD (gastroesophageal reflux disease)   . Headache(784.0)    Sinus headaches  . Hyperlipidemia   . Hypertension    PATIENT DENIES    . Hypothyroidism   . Other testicular hypofunction   . Prediabetes   . Sleep apnea 2006   uses a cpap -desated to 55% during study, uses CPAP nightly  . Thyroid disease   . Tinnitus of both ears    Hx: of  . Vitamin D deficiency    Past Surgical History:  Procedure Laterality Date  . CARDIAC CATHETERIZATION  03/30/2010   normal coronaries, RCA takeoff of the left coronary cusp, EF 55% [facility])  . CARPAL TUNNEL RELEASE Right 01/12/2016   Procedure: CARPAL TUNNEL RELEASE;  Surgeon: Tania Ade, MD;  Location: [place];  Service: Orthopedics;  Laterality: Right;  Right carpal tunnel release  . COLONOSCOPY     x2  .  KNEE ARTHROPLASTY Left 10/16/2012   Procedure: LEFT COMPUTER ASSISTED TOTAL KNEE ARTHROPLASTY;  Surgeon: Alta Corning, MD;  Location: [place];  Service: Orthopedics;  Laterality: Left;  . KNEE ARTHROSCOPY  05/12/2012   Procedure: ARTHROSCOPY KNEE;  Surgeon: Alta Corning, MD;  Location: [place];  Service: Orthopedics;  Laterality: Left;  . KNEE SURGERY    . MOUTH SURGERY    . TOTAL KNEE ARTHROPLASTY Left 10/16/2012   Dr Berenice Primas  . TOTAL KNEE ARTHROPLASTY Right 01/23/2018   Procedure: RIGHT TOTAL KNEE ARTHROPLASTY;  Surgeon: Gaynelle Arabian, MD;  Location: [facility];  Service: Orthopedics;  Laterality: Right;  . UPPER GI ENDOSCOPY     x2   Review of Systems   10 point systems review negative except as above.    Objective:   Physical Exam  BP 116/88   Pulse (!) 104   Temp 97.9 F (36.6 C)   Resp 18   Ht 5\' 7"  (1.702 m)   Wt 175 lb 12.8  oz (79.7 kg)   BMI 27.53 kg/m   HEENT - WNL. Neck - supple.  Chest - Clear equal BS. Cor - Nl HS. RRR w/o sig m. PP 1(+). No edema. Abd- Soft. Benign. MS- FROM w/o deformities.  Gait Nl. Neuro -  Nl w/o focal abnormalities.    Assessment & Plan:   1. Weight loss  - CBC with Differential/Platelet - COMPLETE METABOLIC PANEL WITH GFR  2. Nausea  - CBC with Differential/Platelet - COMPLETE METABOLIC PANEL WITH GFR  3. Weakness  - CBC with Differential/Platelet - COMPLETE METABOLIC PANEL WITH GFR  4. Pain and swelling of knee, left  - gabapentin (NEURONTIN) 600 MG tablet; Take 1/2 to 1 tablet 3 x /day as needed for pain  Dispense: 270 tablet; Refill: 1  5. Medication management  - CBC with Differential/Platelet - COMPLETE METABOLIC PANEL WITH GFR

## 2018-02-24 LAB — COMPLETE METABOLIC PANEL WITH GFR
AG Ratio: 1.6 (calc) (ref 1.0–2.5)
ALT: 17 U/L (ref 9–46)
AST: 16 U/L (ref 10–35)
Albumin: 4.6 g/dL (ref 3.6–5.1)
Alkaline phosphatase (APISO): 71 U/L (ref 40–115)
BUN: 21 mg/dL (ref 7–25)
CO2: 30 mmol/L (ref 20–32)
Calcium: 10.2 mg/dL (ref 8.6–10.3)
Chloride: 100 mmol/L (ref 98–110)
Creat: 1.19 mg/dL (ref 0.70–1.25)
GFR, Est African American: 76 mL/min/{1.73_m2} (ref >=60)
GFR, Est Non African American: 66 mL/min/{1.73_m2} (ref >=60)
Globulin: 2.9 g/dL (calc) (ref 1.9–3.7)
Glucose, Bld: 129 mg/dL — ABNORMAL HIGH (ref 65–99)
Potassium: 4.8 mmol/L (ref 3.5–5.3)
Sodium: 138 mmol/L (ref 135–146)
Total Bilirubin: 0.5 mg/dL (ref 0.2–1.2)
Total Protein: 7.5 g/dL (ref 6.1–8.1)

## 2018-02-24 LAB — CBC WITH DIFFERENTIAL/PLATELET
Basophils Absolute: 122 cells/uL (ref 0–200)
Basophils Relative: 1.8 %
Eosinophils Absolute: 279 cells/uL (ref 15–500)
Eosinophils Relative: 4.1 %
HCT: 49.9 % (ref 38.5–50.0)
Hemoglobin: 16.4 g/dL (ref 13.2–17.1)
Lymphs Abs: 2999 cells/uL (ref 850–3900)
MCH: 28.3 pg (ref 27.0–33.0)
MCHC: 32.9 g/dL (ref 32.0–36.0)
MCV: 86.2 fL (ref 80.0–100.0)
MPV: 9.6 fL (ref 7.5–12.5)
Monocytes Relative: 5.9 %
Neutro Abs: 2999 cells/uL (ref 1500–7800)
Neutrophils Relative %: 44.1 %
Platelets: 420 10*3/uL — ABNORMAL HIGH (ref 140–400)
RBC: 5.79 10*6/uL (ref 4.20–5.80)
RDW: 12.8 % (ref 11.0–15.0)
Total Lymphocyte: 44.1 %
WBC mixed population: 401 cells/uL (ref 200–950)
WBC: 6.8 10*3/uL (ref 3.8–10.8)

## 2018-02-26 ENCOUNTER — Encounter: Payer: Self-pay | Admitting: Internal Medicine

## 2018-03-02 ENCOUNTER — Ambulatory Visit: Payer: Self-pay | Admitting: Internal Medicine

## 2018-06-13 ENCOUNTER — Other Ambulatory Visit: Payer: Self-pay | Admitting: Internal Medicine

## 2018-06-13 DIAGNOSIS — N529 Male erectile dysfunction, unspecified: Secondary | ICD-10-CM

## 2018-06-13 MED ORDER — TADALAFIL 20 MG PO TABS: ORAL_TABLET | ORAL | 12 refills | Status: DC

## 2018-06-15 ENCOUNTER — Other Ambulatory Visit: Payer: Self-pay | Admitting: Internal Medicine

## 2018-06-15 DIAGNOSIS — N529 Male erectile dysfunction, unspecified: Secondary | ICD-10-CM

## 2018-06-15 MED ORDER — TADALAFIL 20 MG PO TABS: ORAL_TABLET | ORAL | 12 refills | Status: DC

## 2018-06-17 IMAGING — CT CT CHEST LUNG CANCER SCREENING LOW DOSE W/O CM
1 of 3 series · 9 of 40 positions shown, 12 images · non-contrast
Comparison: Low-dose lung cancer screening CT chest dated
06/09/2015

CLINICAL DATA: 61-year-old male former smoker, quit 2.5 years ago,
with 40 pack-year history of smoking, for follow-up lung cancer
screening

EXAM:
CT CHEST WITHOUT CONTRAST LOW-DOSE FOR LUNG CANCER SCREENING
TECHNIQUE: Multidetector CT imaging of the chest was performed following the
standard protocol without IV contrast.

[ct lung segmentation data · axial · 0.73mm/px · z∈[-316,-316]mm · 9 of 309 frames shown]
[frame 1/309  mediastinal]
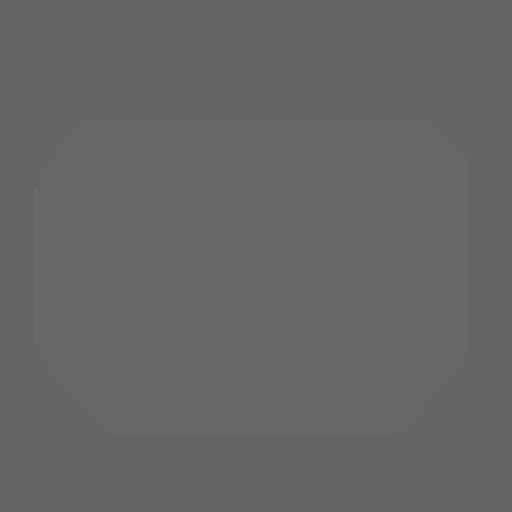
[frame 1/309  lung]
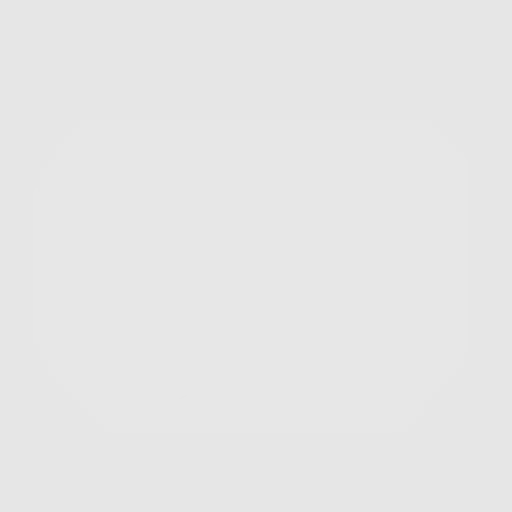
[frame 35/309  lung]
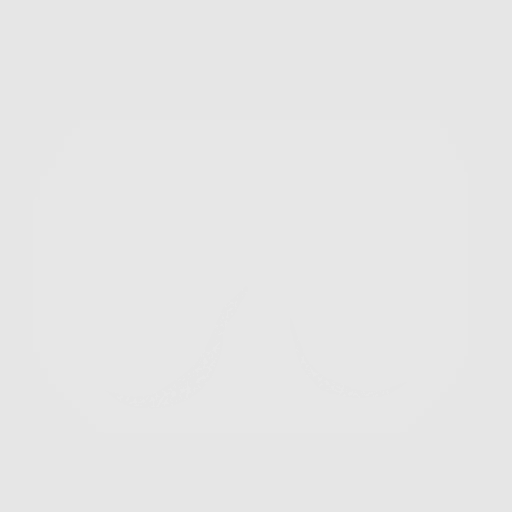
[frame 69/309  lung]
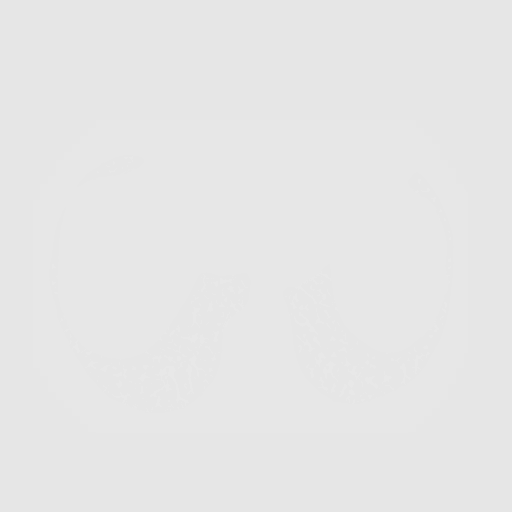
[frame 103/309  lung]
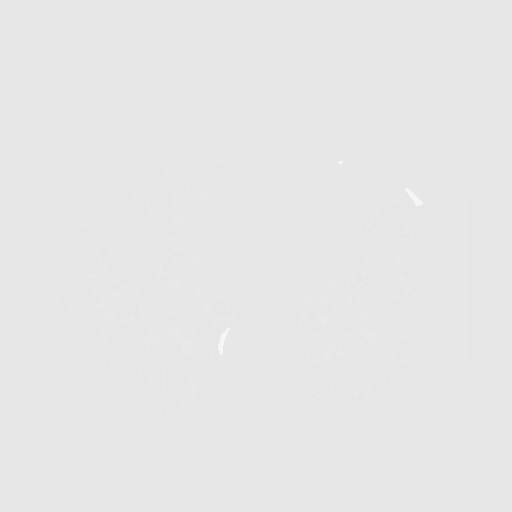
[frame 137/309  mediastinal]
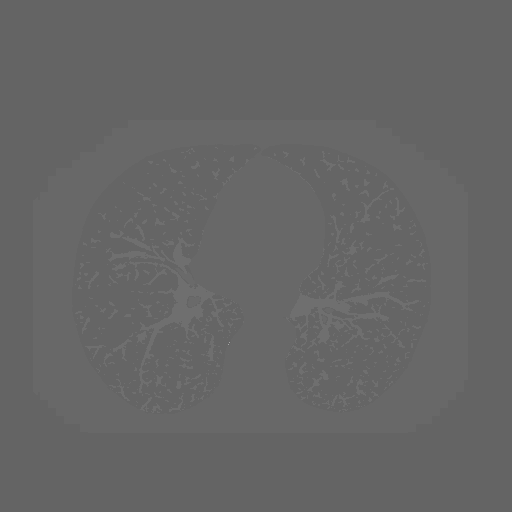
[frame 137/309  lung]
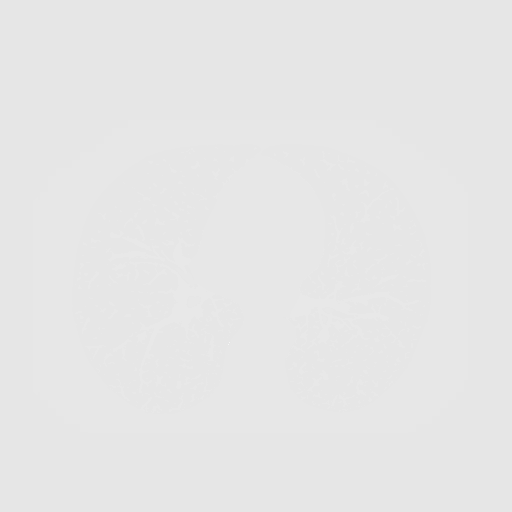
[frame 172/309  lung]
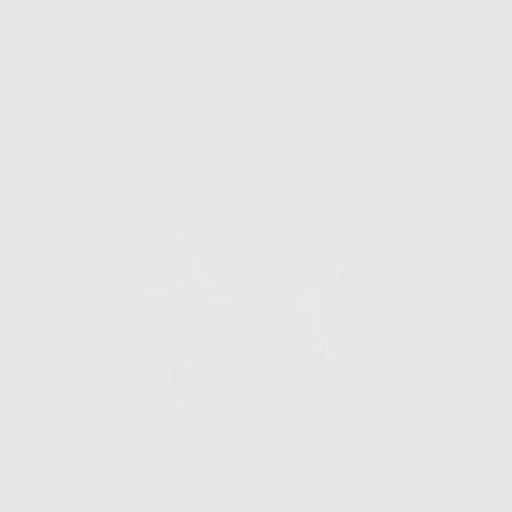
[frame 206/309  lung]
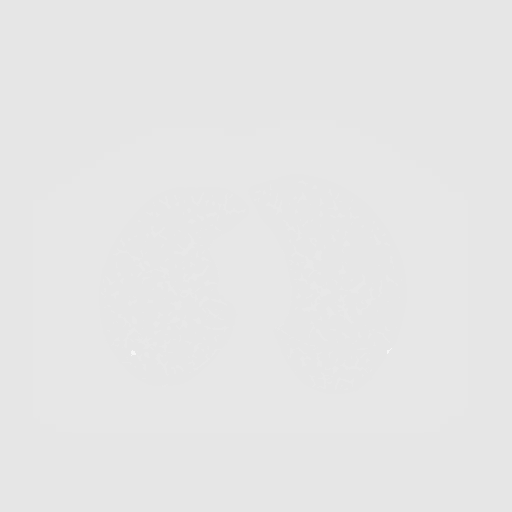
[frame 240/309  lung]
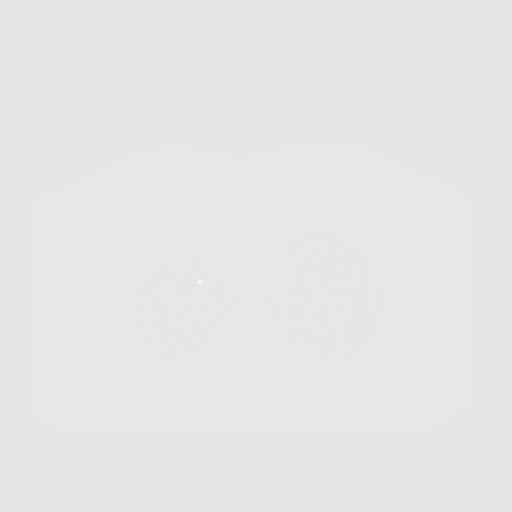
[frame 274/309  mediastinal]
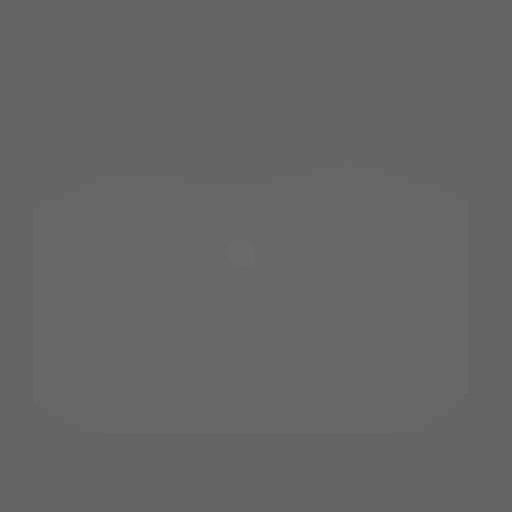
[frame 274/309  lung]
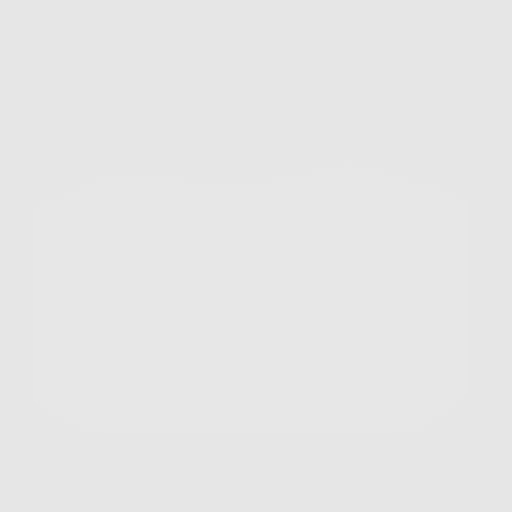

[9 of 40 positions shown; findings below may reference images not displayed]

FINDINGS: Cardiovascular: The heart is normal in size. No pericardial
effusion.

No evidence of thoracic aortic aneurysm. Very mild atherosclerotic
calcifications the aortic root.

Mediastinum/Nodes: No suspicious mediastinal lymphadenopathy.

Lungs/Pleura: Evaluation of the lung parenchyma is mildly
constrained by motion degradation.

Mild biapical pleural-parenchymal scarring.

Multifocal ground-glass and subsolid nodular opacities in the
bilateral upper lobes, including a dominant 13.5 mm nodular opacity
in the right lung apex (series 5/image 74). This appearance is new
from the prior and favors multifocal infection.

Mild centrilobular emphysematous changes, upper lobe predominant.

No pleural effusion or pneumothorax.

Upper Abdomen: Visualized upper abdomen is grossly unremarkable.

Musculoskeletal: Degenerative changes of the visualized
thoracolumbar spine.
IMPRESSION: Lung-RADS 0, incomplete. Additional lung cancer screening CT
images/or comparison to prior chest CT examinations is needed.

Multifocal ground-glass and sub solid nodular opacities in the
bilateral upper lobes, favoring multifocal infection. Repeat
low-dose lung cancer screening CT chest is suggested in 3 months,
after appropriate antimicrobial therapy, to assess for underlying
nodules.

Aortic Atherosclerosis (L2ASF-S4R.R) and Emphysema (L2ASF-RKE.7).

## 2018-07-05 ENCOUNTER — Encounter: Payer: Self-pay | Admitting: Internal Medicine

## 2018-07-05 NOTE — Progress Notes (Signed)
This very nice 63 y.o. MWM  presents for follow up with HTN, HLD, T2_NIDDM and Vitamin D Deficiency. Patient is on CPAP for OSA w/improved sleep hygiene. Patient had extensive labs in November at the [facility] clinic & desires not to have labs done today.      He does report a pattern of watery diarrhea about once a week with 4-5 BM's in the morning occurring weekly for 2-3 months. Denies infectious sx's as fever, chills, blood in BM's. No associated noted food triggers and weight has actually gone up 14# over the last 4 months.         Patient is treated for HTN (2008)  & BP has been controlled at home. Today's BP is at goal - 126/84.  Patient has hx/o a False (+) Cardiolite with a Negative Heart Cath in 2011. Patient has had no complaints of any cardiac type chest pain, palpitations, dyspnea / orthopnea / PND, dizziness, claudication, or dependent edema.     Hyperlipidemia is controlled with diet & meds. Patient denies myalgias or other med SE's. Last Lipids were at goal: Lab Results  Component Value Date   CHOL 126 02/09/2018   HDL 45 02/09/2018   LDLCALC 58 02/09/2018   TRIG 148 02/09/2018   CHOLHDL 2.8 02/09/2018      Also, the patient has Obesity (BMI 29+) and T2_NIDDM (2014) /CKD2 and has had no symptoms of reactive hypoglycemia, diabetic polys, paresthesias or visual blurring.  He does not monitor CBG's. Last A1c was  Lab Results  Component Value Date   HGBA1C 6.4 (H) 02/09/2018      Patient has been on Thyroid replacement post RAI-131 Tx for Grave's Dz since 2000. He also is on parenteral  Testosterone Replacement .     Further, the patient also has history of Vitamin D Deficiency (27" / 2008) and supplements vitamin D without any suspected side-effects. Last vitamin D was still low (goal 70-100): Lab Results  Component Value Date   VD25OH 52 02/09/2018   Current Outpatient Medications on File Prior to Visit  Medication Sig  . Alogliptin Benzoate 25 MG TABS Take 25  mg by mouth daily.  Marland Kitchen atorvastatin (LIPITOR) 80 MG tablet Take 80 mg by mouth every evening.   Marland Kitchen buPROPion (WELLBUTRIN SR) 150 MG 12 hr tablet Take 1 tablet (150 mg total) by mouth 2 (two) times daily.  . Carboxymethylcellul-Glycerin (LUBRICATING EYE DROPS OP) Apply 1 drop to eye daily as needed (dry eyes).  . Cholecalciferol (VITAMIN D3) 5000 units CAPS Take 5,000 Units by mouth every evening.  . cyclobenzaprine (FLEXERIL) 10 MG tablet Take 10 mg by mouth 3 (three) times daily as needed for muscle spasms.  Marland Kitchen esomeprazole (NEXIUM) 40 MG capsule Take 40 mg by mouth 2 (two) times daily.  . fenofibrate (TRICOR) 145 MG tablet Take 1 tablet (145 mg total) by mouth daily.  . fenofibrate 160 MG tablet Take 160 mg by mouth daily.  Marland Kitchen levothyroxine (SYNTHROID, LEVOTHROID) 137 MCG tablet Take 137 mcg by mouth daily before breakfast.  . loratadine-pseudoephedrine (CLARITIN-D 24-HOUR) 10-240 MG 24 hr tablet Take 1 tablet by mouth daily as needed for allergies.  . metFORMIN (GLUCOPHAGE-XR) 500 MG 24 hr tablet TAKE 1 TABLET TWICE DAILY WITH BREAKFAST AND LUNCH, AND 2 TABLETS WITH SUPPER (Patient taking differently: Take 500 mg by mouth twice daily)  . Multiple Vitamin (MULTIVITAMIN WITH MINERALS) TABS tablet Take 1 tablet by mouth daily.  . Omega 3-6-9 Fatty Acids (  OMEGA 3-6-9 COMPLEX) CAPS Take 1 capsule by mouth 2 (two) times daily.  . tadalafil (CIALIS) 20 MG tablet Take as directed  . testosterone cypionate (DEPO-TESTOSTERONE) 200 MG/ML injection Inject 2 mLs (400 mg total) into the muscle every 14 (fourteen) days. (Patient taking differently: Inject 200 mg into the muscle every 14 (fourteen) days. )  . finasteride (PROSCAR) 5 MG tablet Take 1 tablet (5 mg total) by mouth daily. (Patient taking differently: Take 2.5 mg by mouth every evening. )   No current facility-administered medications on file prior to visit.    No Known Allergies PMHx:   Past Medical History:  Diagnosis Date  . Arthritis   .  Complication of anesthesia    Pt states that he experienced some numbness on right side of tongue after surgery for 2 months; continues to experience gum sensitivity to date  . Depression   . Diabetes mellitus without complication ([place])   . DJD (degenerative joint disease)   . GERD (gastroesophageal reflux disease)   . Headache(784.0)    Sinus headaches  . Hyperlipidemia   . Hypertension    PATIENT DENIES    . Hypothyroidism   . Other testicular hypofunction   . Prediabetes   . Sleep apnea 2006   uses a cpap -desated to 55% during study, uses CPAP nightly  . Thyroid disease   . Tinnitus of both ears    Hx: of  . Vitamin D deficiency    Immunization History  Administered Date(s) Administered  . Influenza Split 04/18/2013, 05/05/2016, 05/11/2017  . Influenza, Seasonal, Injecte, Preservative Fre 06/16/2015  . Influenza-Unspecified 06/03/2018  . Pneumococcal Polysaccharide-23 05/14/2004, 04/12/2011  . Tdap 04/12/2011   Past Surgical History:  Procedure Laterality Date  . CARDIAC CATHETERIZATION  03/30/2010   normal coronaries, RCA takeoff of the left coronary cusp, EF 55% [facility])  . CARPAL TUNNEL RELEASE Right 01/12/2016   Procedure: CARPAL TUNNEL RELEASE;  Surgeon: Tania Ade, MD;  Location: [place];  Service: Orthopedics;  Laterality: Right;  Right carpal tunnel release  . COLONOSCOPY     x2  . KNEE ARTHROPLASTY Left 10/16/2012   Procedure: LEFT COMPUTER ASSISTED TOTAL KNEE ARTHROPLASTY;  Surgeon: Alta Corning, MD;  Location: [place];  Service: Orthopedics;  Laterality: Left;  . KNEE ARTHROSCOPY  05/12/2012   Procedure: ARTHROSCOPY KNEE;  Surgeon: Alta Corning, MD;  Location: [place];  Service: Orthopedics;  Laterality: Left;  . KNEE SURGERY    . MOUTH SURGERY    . TOTAL KNEE ARTHROPLASTY Left 10/16/2012   Dr Berenice Primas  . TOTAL KNEE ARTHROPLASTY Right 01/23/2018   Procedure: RIGHT TOTAL KNEE ARTHROPLASTY;  Surgeon: Gaynelle Arabian,  MD;  Location: [facility];  Service: Orthopedics;  Laterality: Right;  . UPPER GI ENDOSCOPY     x2   FHx:    Reviewed / unchanged  SHx:    Reviewed / unchanged   Systems Review:  Constitutional: Denies fever, chills, wt changes, headaches, insomnia, fatigue, night sweats, change in appetite. Eyes: Denies redness, blurred vision, diplopia, discharge, itchy, watery eyes.  ENT: Denies discharge, congestion, post nasal drip, epistaxis, sore throat, earache, hearing loss, dental pain, tinnitus, vertigo, sinus pain, snoring.  CV: Denies chest pain, palpitations, irregular heartbeat, syncope, dyspnea, diaphoresis, orthopnea, PND, claudication or edema. Respiratory: denies cough, dyspnea, DOE, pleurisy, hoarseness, laryngitis, wheezing.  Gastrointestinal: Denies dysphagia, odynophagia, heartburn, reflux, water brash, abdominal pain or cramps, nausea, vomiting, bloating, diarrhea, constipation, hematemesis, melena, hematochezia  or hemorrhoids. Genitourinary:  Denies dysuria, frequency, urgency, nocturia, hesitancy, discharge, hematuria or flank pain. Musculoskeletal: Denies arthralgias, myalgias, stiffness, jt. swelling, pain, limping or strain/sprain.  Skin: Denies pruritus, rash, hives, warts, acne, eczema or change in skin lesion(s). Neuro: No weakness, tremor, incoordination, spasms, paresthesia or pain. Psychiatric: Denies confusion, memory loss or sensory loss. Endo: Denies change in weight, skin or hair change.  Heme/Lymph: No excessive bleeding, bruising or enlarged lymph nodes.  Physical Exam  BP 126/84   Pulse 92   Temp 97.8 F (36.6 C)   Resp 16   Ht 5\' 7"  (1.702 m)   Wt 189 lb 6.4 oz (85.9 kg)   BMI 29.66 kg/m   Appears  well nourished, well groomed  and in no distress.  Eyes: PERRLA, EOMs, conjunctiva no swelling or erythema. Sinuses: No frontal/maxillary tenderness ENT/Mouth: EAC's clear, TM's nl w/o erythema, bulging. Nares clear w/o erythema, swelling, exudates. Oropharynx  clear without erythema or exudates. Oral hygiene is good. Tongue normal, non obstructing. Hearing intact.  Neck: Supple. Thyroid not palpable. Car 2+/2+ without bruits, nodes or JVD. Chest: Respirations nl with BS clear & equal w/o rales, rhonchi, wheezing or stridor.  Cor: Heart sounds normal w/ regular rate and rhythm without sig. murmurs, gallops, clicks or rubs. Peripheral pulses normal and equal  without edema.  Abdomen: Soft & bowel sounds normal. Non-tender w/o guarding, rebound, hernias, masses or organomegaly.  Lymphatics: Unremarkable.  Musculoskeletal: Full ROM all peripheral extremities, joint stability, 5/5 strength and normal gait.  Skin: Warm, dry without exposed rashes, lesions or ecchymosis apparent.  Neuro: Cranial nerves intact, reflexes equal bilaterally. Sensory-motor testing grossly intact. Tendon reflexes grossly intact.  Pysch: Alert & oriented x 3.  Insight and judgement nl & appropriate. No ideations.  Assessment and Plan:  1. Essential hypertension  - Continue medication, monitor blood pressure at home.  - Continue DASH diet.  Reminder to go to the ER if any CP,  SOB, nausea, dizziness, severe HA, changes vision/speech.  2. Type 2 diabetes mellitus with stage 2 chronic kidney disease, without long-term current use of insulin (HCC)  - Continue diet, exercise, lifestyle modifications.   3. Functional diarrhea  - Recommended keep a food Diary to hopefully identify any food triggers.       Discussed  regular exercise, BP monitoring, weight control to achieve/maintain BMI less than 25 and discussed med and SE's.  Over 81minutes of exam, counseling, chart review was performed.

## 2018-07-05 NOTE — Patient Instructions (Signed)

## 2018-07-06 ENCOUNTER — Ambulatory Visit (INDEPENDENT_AMBULATORY_CARE_PROVIDER_SITE_OTHER): Payer: BLUE CROSS/BLUE SHIELD | Admitting: Internal Medicine

## 2018-07-06 VITALS — BP 126/84 | HR 92 | Temp 97.8°F | Resp 16 | Ht 67.0 in | Wt 189.4 lb

## 2018-07-06 DIAGNOSIS — I10 Essential (primary) hypertension: Secondary | ICD-10-CM

## 2018-07-06 DIAGNOSIS — K591 Functional diarrhea: Secondary | ICD-10-CM | POA: Diagnosis not present

## 2018-07-06 DIAGNOSIS — E1122 Type 2 diabetes mellitus with diabetic chronic kidney disease: Secondary | ICD-10-CM

## 2018-07-06 DIAGNOSIS — N182 Chronic kidney disease, stage 2 (mild): Secondary | ICD-10-CM | POA: Diagnosis not present

## 2018-09-06 ENCOUNTER — Ambulatory Visit (INDEPENDENT_AMBULATORY_CARE_PROVIDER_SITE_OTHER): Payer: BLUE CROSS/BLUE SHIELD | Admitting: Internal Medicine

## 2018-09-06 VITALS — BP 128/72 | HR 88 | Temp 97.0°F | Resp 16 | Ht 67.0 in | Wt 192.8 lb

## 2018-09-06 DIAGNOSIS — Z131 Encounter for screening for diabetes mellitus: Secondary | ICD-10-CM

## 2018-09-06 DIAGNOSIS — E079 Disorder of thyroid, unspecified: Secondary | ICD-10-CM

## 2018-09-06 DIAGNOSIS — Z79899 Other long term (current) drug therapy: Secondary | ICD-10-CM

## 2018-09-06 DIAGNOSIS — Z87891 Personal history of nicotine dependence: Secondary | ICD-10-CM

## 2018-09-06 DIAGNOSIS — I700 Atherosclerosis of aorta: Secondary | ICD-10-CM

## 2018-09-06 DIAGNOSIS — R350 Frequency of micturition: Secondary | ICD-10-CM | POA: Diagnosis not present

## 2018-09-06 DIAGNOSIS — Z13220 Encounter for screening for lipoid disorders: Secondary | ICD-10-CM

## 2018-09-06 DIAGNOSIS — G4733 Obstructive sleep apnea (adult) (pediatric): Secondary | ICD-10-CM

## 2018-09-06 DIAGNOSIS — Z125 Encounter for screening for malignant neoplasm of prostate: Secondary | ICD-10-CM

## 2018-09-06 DIAGNOSIS — Z1212 Encounter for screening for malignant neoplasm of rectum: Secondary | ICD-10-CM

## 2018-09-06 DIAGNOSIS — Z136 Encounter for screening for cardiovascular disorders: Secondary | ICD-10-CM

## 2018-09-06 DIAGNOSIS — Z1329 Encounter for screening for other suspected endocrine disorder: Secondary | ICD-10-CM | POA: Diagnosis not present

## 2018-09-06 DIAGNOSIS — E559 Vitamin D deficiency, unspecified: Secondary | ICD-10-CM

## 2018-09-06 DIAGNOSIS — Z1389 Encounter for screening for other disorder: Secondary | ICD-10-CM | POA: Diagnosis not present

## 2018-09-06 DIAGNOSIS — I10 Essential (primary) hypertension: Secondary | ICD-10-CM

## 2018-09-06 DIAGNOSIS — E1122 Type 2 diabetes mellitus with diabetic chronic kidney disease: Secondary | ICD-10-CM

## 2018-09-06 DIAGNOSIS — Z0000 Encounter for general adult medical examination without abnormal findings: Secondary | ICD-10-CM | POA: Diagnosis not present

## 2018-09-06 DIAGNOSIS — Z9989 Dependence on other enabling machines and devices: Secondary | ICD-10-CM

## 2018-09-06 DIAGNOSIS — Z111 Encounter for screening for respiratory tuberculosis: Secondary | ICD-10-CM

## 2018-09-06 DIAGNOSIS — Z130 Encounter for screening for diseases of the blood and blood-forming organs and certain disorders involving the immune mechanism: Secondary | ICD-10-CM

## 2018-09-06 DIAGNOSIS — N401 Enlarged prostate with lower urinary tract symptoms: Secondary | ICD-10-CM

## 2018-09-06 DIAGNOSIS — E349 Endocrine disorder, unspecified: Secondary | ICD-10-CM

## 2018-09-06 DIAGNOSIS — N182 Chronic kidney disease, stage 2 (mild): Secondary | ICD-10-CM

## 2018-09-06 DIAGNOSIS — E782 Mixed hyperlipidemia: Secondary | ICD-10-CM

## 2018-09-06 DIAGNOSIS — Z1211 Encounter for screening for malignant neoplasm of colon: Secondary | ICD-10-CM

## 2018-09-06 DIAGNOSIS — I2510 Atherosclerotic heart disease of native coronary artery without angina pectoris: Secondary | ICD-10-CM

## 2018-09-06 DIAGNOSIS — Z0001 Encounter for general adult medical examination with abnormal findings: Secondary | ICD-10-CM

## 2018-09-06 DIAGNOSIS — R5383 Other fatigue: Secondary | ICD-10-CM

## 2018-09-06 NOTE — Progress Notes (Signed)
[facility]   Unk Pinto, M.D.     Uvaldo Bristle. Silverio Lay, P.A.-C Liane Comber, [place]                [address] [place] [address] Telephone [phone] Telefax [phone] Annual  Screening/Preventative Visit  & Comprehensive Evaluation & Examination     This very nice 63 y.o. MWM presents for a Screening /Preventative Visit & comprehensive evaluation and management of multiple medical co-morbidities.  Patient has been followed for HTN, HLD, T2_NIDDM  Prediabetes and Vitamin D Deficiency. Patient is on CPAP for OSA with improved sleep hygiene. Patient has GERD controlled on his meds.      HTN predates circa 2008. Patient's BP has been controlled at home.  Today's BP is at goal - 128/72.  In 2011 he had a negative heart cath after a false positive Cardiolite. Patient denies any cardiac symptoms as chest pain, palpitations, shortness of breath, dizziness or ankle swelling.     Patient's hyperlipidemia is controlled with diet and medications. Patient denies myalgias or other medication SE's. Last lipids were at goal: Lab Results  Component Value Date   CHOL 126 02/09/2018   HDL 45 02/09/2018   LDLCALC 58 02/09/2018   TRIG 148 02/09/2018   CHOLHDL 2.8 02/09/2018      Patient has Obesity (BMI 30+) and hx/o T2_NIDDM (2014) w/CKD2 and patient denies reactive hypoglycemic symptoms, visual blurring, diabetic polys or paresthesias. Last A1c was not at goal: Lab Results  Component Value Date   HGBA1C 6.4 (H) 02/09/2018       Finally, patient has history of Vitamin D Deficiency ("27" / 2008) and last vitamin D was   Lab Results  Component Value Date   VD25OH 52 02/09/2018   Current Outpatient Medications on File Prior to Visit  Medication Sig  . Alogliptin Benzoate 25 MG TABS Take 25 mg by mouth daily.  Marland Kitchen atorvastatin (LIPITOR) 80 MG tablet Take 80 mg by mouth every evening.   Marland Kitchen  buPROPion (WELLBUTRIN SR) 150 MG 12 hr tablet Take 1 tablet (150 mg total) by mouth 2 (two) times daily.  . Carboxymethylcellul-Glycerin (LUBRICATING EYE DROPS OP) Apply 1 drop to eye daily as needed (dry eyes).  . Cholecalciferol (VITAMIN D3) 5000 units CAPS Take 5,000 Units by mouth every evening.  . cyclobenzaprine (FLEXERIL) 10 MG tablet Take 10 mg by mouth 3 (three) times daily as needed for muscle spasms.  Marland Kitchen esomeprazole (NEXIUM) 40 MG capsule Take 40 mg by mouth 2 (two) times daily.  . fenofibrate (TRICOR) 145 MG tablet Take 1 tablet (145 mg total) by mouth daily.  . fenofibrate 160 MG tablet Take 160 mg by mouth daily.  Marland Kitchen levothyroxine (SYNTHROID, LEVOTHROID) 137 MCG tablet Take 137 mcg by mouth daily before breakfast.  . loratadine-pseudoephedrine (CLARITIN-D 24-HOUR) 10-240 MG 24 hr tablet Take 1 tablet by mouth daily as needed for allergies.  . metFORMIN (GLUCOPHAGE-XR) 500 MG 24 hr tablet TAKE 1 TABLET TWICE DAILY WITH BREAKFAST AND LUNCH, AND 2 TABLETS WITH SUPPER (Patient taking differently: Take 500 mg by mouth twice daily)  . Multiple Vitamin (MULTIVITAMIN WITH MINERALS) TABS tablet Take 1 tablet by mouth daily.  . tadalafil (CIALIS) 20 MG tablet Take as directed  . testosterone cypionate (DEPO-TESTOSTERONE) 200 MG/ML injection Inject 2 mLs (400 mg total) into the muscle every 14 (fourteen) days. (  Patient taking differently: Inject 200 mg into the muscle every 14 (fourteen) days. )  . finasteride (PROSCAR) 5 MG tablet Take 1 tablet (5 mg total) by mouth daily. (Patient taking differently: Take 2.5 mg by mouth every evening. )   No current facility-administered medications on file prior to visit.    No Known Allergies   Past Medical History:  Diagnosis Date  . Arthritis   . Complication of anesthesia    Pt states that he experienced some numbness on right side of tongue after surgery for 2 months; continues to experience gum sensitivity to date  . Depression   . Diabetes  mellitus without complication ([place])   . DJD (degenerative joint disease)   . GERD (gastroesophageal reflux disease)   . Headache(784.0)    Sinus headaches  . Hyperlipidemia   . Hypertension    PATIENT DENIES    . Hypothyroidism   . Other testicular hypofunction   . Prediabetes   . Sleep apnea 2006   uses a cpap -desated to 55% during study, uses CPAP nightly  . Thyroid disease   . Tinnitus of both ears    Hx: of  . Vitamin D deficiency    Health Maintenance  Topic Date Due  . COLONOSCOPY  06/23/2007  . OPHTHALMOLOGY EXAM  12/11/2017  . FOOT EXAM  08/01/2018  . URINE MICROALBUMIN  08/01/2018  . HEMOGLOBIN A1C  08/12/2018  . TETANUS/TDAP  04/11/2021  . INFLUENZA VACCINE  Completed  . PNEUMOCOCCAL POLYSACCHARIDE VACCINE AGE 95-64 HIGH RISK  Completed  . Hepatitis C Screening  Completed  . HIV Screening  Completed   Immunization History  Administered Date(s) Administered  . Influenza Split 04/18/2013, 05/05/2016, 05/11/2017  . Influenza, Seasonal, Injecte, Preservative Fre 06/16/2015  . Influenza-Unspecified 06/03/2018  . Pneumococcal Polysaccharide-23 05/14/2004, 04/12/2011  . Tdap 04/12/2011   Last Colon -  Scheduled this year at the [place].   Past Surgical History:  Procedure Laterality Date  . CARDIAC CATHETERIZATION  03/30/2010   normal coronaries, RCA takeoff of the left coronary cusp, EF 55% [facility])  . CARPAL TUNNEL RELEASE Right 01/12/2016   Procedure: CARPAL TUNNEL RELEASE;  Surgeon: Tania Ade, MD;  Location: [place];  Service: Orthopedics;  Laterality: Right;  Right carpal tunnel release  . COLONOSCOPY     x2  . KNEE ARTHROPLASTY Left 10/16/2012   Procedure: LEFT COMPUTER ASSISTED TOTAL KNEE ARTHROPLASTY;  Surgeon: Alta Corning, MD;  Location: [place];  Service: Orthopedics;  Laterality: Left;  . KNEE ARTHROSCOPY  05/12/2012   Procedure: ARTHROSCOPY KNEE;  Surgeon: Alta Corning, MD;  Location: [place];  Service:  Orthopedics;  Laterality: Left;  . KNEE SURGERY    . MOUTH SURGERY    . TOTAL KNEE ARTHROPLASTY Left 10/16/2012   Dr Berenice Primas  . TOTAL KNEE ARTHROPLASTY Right 01/23/2018   Procedure: RIGHT TOTAL KNEE ARTHROPLASTY;  Surgeon: Gaynelle Arabian, MD;  [facility];  Service: Orthopedics;  Laterality: Right;  . UPPER GI ENDOSCOPY     x2   Family History  Problem Relation Age of Onset  . Cancer Mother        breast  . Hyperlipidemia Mother   . Hypertension Mother   . Cancer Father        pancreatic   Social History   Socioeconomic History  . Marital status: Married    Spouse name: Not on file  . Number of children: Not on file  . Years of education:  Not on file  . Highest education level: Not on file  Occupational History  . 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 Smoking status: Former Smoker    Packs/day: 1.00    Years: 40.00    Pack years: 40.00    Types: Cigarettes    Last attempt to quit: 04/14/2015    Years since quitting: 3.4  . Smokeless tobacco: Never Used  . Tobacco comment: "Vaping" ; NO VAPING   Substance and Sexual Activity  . Alcohol use: No    Alcohol/week: 1.0 standard drinks    Types: 1 Standard drinks or equivalent per week    Comment: rarely  . Drug use: No  . Sexual activity: Yes  Lifestyle  . Physical activity:    Days per week: Not on file    Minutes per session: Not on file  . Stress: Not on file    ROS Constitutional: Denies fever, chills, weight loss/gain, headaches, insomnia,  night sweats or change in appetite. Does c/o fatigue. Eyes: Denies redness, blurred vision, diplopia, discharge, itchy or watery eyes.  ENT: Denies discharge, congestion, post nasal drip, epistaxis, sore throat, earache, hearing loss, dental pain, Tinnitus, Vertigo, Sinus pain or snoring.  Cardio: Denies chest pain, palpitations, irregular heartbeat, syncope, dyspnea, diaphoresis, orthopnea, PND, claudication or edema Respiratory: denies cough, dyspnea, DOE,  pleurisy, hoarseness, laryngitis or wheezing.  Gastrointestinal: Denies dysphagia, heartburn, reflux, water brash, pain, cramps, nausea, vomiting, bloating, diarrhea, constipation, hematemesis, melena, hematochezia, jaundice or hemorrhoids Genitourinary: Denies dysuria, frequency, discharge, hematuria or flank pain.  Has urgency, nocturia x 2-3 & occasional hesitancy. Musculoskeletal: Denies arthralgia, myalgia, stiffness, Jt. Swelling, pain, limp or strain/sprain. Denies Falls. Skin: Denies puritis, rash, hives, warts, acne, eczema or change in skin lesion Neuro: No weakness, tremor, incoordination, spasms, paresthesia or pain Psychiatric: Denies confusion, memory loss or sensory loss. Denies Depression. Endocrine: Denies change in weight, skin, hair change, nocturia, and paresthesia, diabetic polys, visual blurring or hyper / hypo glycemic episodes.  Heme/Lymph: No excessive bleeding, bruising or enlarged lymph nodes.  Physical Exam  BP 128/72   Pulse 88   Temp (!) 97 F (36.1 C)   Resp 16   Ht 5\' 7"  (1.702 m)   Wt 192 lb 12.8 oz (87.5 kg)   BMI 30.20 kg/m   General Appearance: Well nourished and well groomed and in no apparent distress.  Eyes: PERRLA, EOMs, conjunctiva no swelling or erythema, normal fundi and vessels. Sinuses: No frontal/maxillary tenderness ENT/Mouth: EACs patent / TMs  nl. Nares clear without erythema, swelling, mucoid exudates. Oral hygiene is good. No erythema, swelling, or exudate. Tongue normal, non-obstructing. Tonsils not swollen or erythematous. Hearing normal.  Neck: Supple, thyroid not palpable. No bruits, nodes or JVD. Respiratory: Respiratory effort normal.  BS equal and clear bilateral without rales, rhonci, wheezing or stridor. Cardio: Heart sounds are normal with regular rate and rhythm and no murmurs, rubs or gallops. Peripheral pulses are normal and equal bilaterally without edema. No aortic or femoral bruits. Chest: symmetric with normal  excursions and percussion.  Abdomen: Soft, with Nl bowel sounds. Nontender, no guarding, rebound, hernias, masses, or organomegaly.  Lymphatics: Non tender without lymphadenopathy.  Musculoskeletal: Full ROM all peripheral extremities, joint stability, 5/5 strength, and normal gait. Skin: Warm and dry without rashes, lesions, cyanosis, clubbing or  ecchymosis.  Neuro: Cranial nerves intact, reflexes equal bilaterally. Normal muscle tone, no cerebellar symptoms. Sensation intact to touch, vibratory and Monofilament to the toes bilaterally. Pysch: Alert and oriented X  3 with normal affect, insight and judgment appropriate.   Assessment and Plan  1. Annual Preventative/Screening Exam             Patient was counseled in prudent diet, weight control to achieve/maintain BMI less than 25, BP monitoring, regular exercise and medications as discussed.  Discussed med effects and SE's. Routine screening labs and tests as requested with regular follow-up as recommended. Over 40 minutes of exam, counseling, chart review and high complex critical decision making was performed

## 2018-09-06 NOTE — Patient Instructions (Signed)

## 2018-09-07 ENCOUNTER — Other Ambulatory Visit: Payer: Self-pay | Admitting: Internal Medicine

## 2018-09-07 DIAGNOSIS — R7879 Finding of abnormal level of heavy metals in blood: Secondary | ICD-10-CM

## 2018-09-07 DIAGNOSIS — R790 Abnormal level of blood mineral: Secondary | ICD-10-CM

## 2018-09-07 DIAGNOSIS — N289 Disorder of kidney and ureter, unspecified: Secondary | ICD-10-CM

## 2018-09-07 LAB — URINALYSIS, ROUTINE W REFLEX MICROSCOPIC
Bilirubin Urine: NEGATIVE
Hgb urine dipstick: NEGATIVE
Ketones, ur: NEGATIVE
Leukocytes,Ua: NEGATIVE
Nitrite: NEGATIVE
Protein, ur: NEGATIVE
Specific Gravity, Urine: 1.027 (ref 1.001–1.03)
pH: 7 (ref 5.0–8.0)

## 2018-09-07 LAB — LIPID PANEL
Cholesterol: 155 mg/dL (ref <200)
HDL: 41 mg/dL (ref >=40)
LDL Cholesterol (Calc): 80 mg/dL (calc)
Non-HDL Cholesterol (Calc): 114 mg/dL (calc) (ref <130)
Total CHOL/HDL Ratio: 3.8 (calc) (ref <5.0)
Triglycerides: 243 mg/dL — ABNORMAL HIGH (ref <150)

## 2018-09-07 LAB — COMPLETE METABOLIC PANEL WITH GFR
AG Ratio: 1.7 (calc) (ref 1.0–2.5)
ALT: 24 U/L (ref 9–46)
AST: 22 U/L (ref 10–35)
Albumin: 4.3 g/dL (ref 3.6–5.1)
Alkaline phosphatase (APISO): 47 U/L (ref 35–144)
BUN/Creatinine Ratio: 12 (calc) (ref 6–22)
BUN: 18 mg/dL (ref 7–25)
CO2: 29 mmol/L (ref 20–32)
Calcium: 9.5 mg/dL (ref 8.6–10.3)
Chloride: 102 mmol/L (ref 98–110)
Creat: 1.51 mg/dL — ABNORMAL HIGH (ref 0.70–1.25)
GFR, Est African American: 57 mL/min/{1.73_m2} — ABNORMAL LOW (ref >=60)
GFR, Est Non African American: 49 mL/min/{1.73_m2} — ABNORMAL LOW (ref >=60)
Globulin: 2.5 g/dL (calc) (ref 1.9–3.7)
Glucose, Bld: 126 mg/dL — ABNORMAL HIGH (ref 65–99)
Potassium: 4.2 mmol/L (ref 3.5–5.3)
Sodium: 139 mmol/L (ref 135–146)
Total Bilirubin: 0.6 mg/dL (ref 0.2–1.2)
Total Protein: 6.8 g/dL (ref 6.1–8.1)

## 2018-09-07 LAB — CBC WITH DIFFERENTIAL/PLATELET
Absolute Monocytes: 561 cells/uL (ref 200–950)
Basophils Absolute: 101 cells/uL (ref 0–200)
Basophils Relative: 1.6 %
Eosinophils Absolute: 290 cells/uL (ref 15–500)
Eosinophils Relative: 4.6 %
HCT: 49.6 % (ref 38.5–50.0)
Hemoglobin: 16.2 g/dL (ref 13.2–17.1)
Lymphs Abs: 2507 cells/uL (ref 850–3900)
MCH: 28.1 pg (ref 27.0–33.0)
MCHC: 32.7 g/dL (ref 32.0–36.0)
MCV: 86.1 fL (ref 80.0–100.0)
MPV: 10.1 fL (ref 7.5–12.5)
Monocytes Relative: 8.9 %
Neutro Abs: 2841 cells/uL (ref 1500–7800)
Neutrophils Relative %: 45.1 %
Platelets: 273 10*3/uL (ref 140–400)
RBC: 5.76 10*6/uL (ref 4.20–5.80)
RDW: 13.6 % (ref 11.0–15.0)
Total Lymphocyte: 39.8 %
WBC: 6.3 10*3/uL (ref 3.8–10.8)

## 2018-09-07 LAB — IRON, TOTAL/TOTAL IRON BINDING CAP
%SAT: 76 % (calc) — ABNORMAL HIGH (ref 20–48)
Iron: 272 ug/dL — ABNORMAL HIGH (ref 50–180)
TIBC: 360 mcg/dL (calc) (ref 250–425)

## 2018-09-07 LAB — HEMOGLOBIN A1C
Hgb A1c MFr Bld: 6.3 % of total Hgb — ABNORMAL HIGH (ref <5.7)
Mean Plasma Glucose: 134 (calc)
eAG (mmol/L): 7.4 (calc)

## 2018-09-07 LAB — TESTOSTERONE: Testosterone: 1007 ng/dL — ABNORMAL HIGH (ref 250–827)

## 2018-09-07 LAB — VITAMIN D 25 HYDROXY (VIT D DEFICIENCY, FRACTURES): Vit D, 25-Hydroxy: 61 ng/mL (ref 30–100)

## 2018-09-07 LAB — INSULIN, RANDOM: Insulin: 21.1 u[IU]/mL — ABNORMAL HIGH

## 2018-09-07 LAB — VITAMIN B12: Vitamin B-12: 615 pg/mL (ref 200–1100)

## 2018-09-07 LAB — MICROALBUMIN / CREATININE URINE RATIO
Creatinine, Urine: 151 mg/dL (ref 20–320)
Microalb Creat Ratio: 3 mcg/mg creat (ref <30)
Microalb, Ur: 0.4 mg/dL

## 2018-09-07 LAB — PSA: PSA: 0.9 ng/mL (ref <=4.0)

## 2018-09-07 LAB — TSH: TSH: 0.38 mIU/L — ABNORMAL LOW (ref 0.40–4.50)

## 2018-09-07 LAB — MAGNESIUM: Magnesium: 1.8 mg/dL (ref 1.5–2.5)

## 2019-03-13 ENCOUNTER — Ambulatory Visit: Payer: Self-pay | Admitting: Internal Medicine

## 2019-10-23 ENCOUNTER — Encounter: Payer: Self-pay | Admitting: Internal Medicine

## 2020-05-18 ENCOUNTER — Other Ambulatory Visit: Payer: Self-pay | Admitting: Internal Medicine

## 2020-05-18 DIAGNOSIS — N529 Male erectile dysfunction, unspecified: Secondary | ICD-10-CM

## 2020-05-18 MED ORDER — TADALAFIL 20 MG PO TABS: ORAL_TABLET | ORAL | 2 refills | Status: DC

## 2020-08-10 ENCOUNTER — Other Ambulatory Visit: Payer: Self-pay | Admitting: Internal Medicine

## 2020-08-10 DIAGNOSIS — N529 Male erectile dysfunction, unspecified: Secondary | ICD-10-CM

## 2020-08-10 MED ORDER — TADALAFIL 20 MG PO TABS: ORAL_TABLET | ORAL | 1 refills | Status: DC

## 2020-10-01 ENCOUNTER — Other Ambulatory Visit: Payer: Self-pay | Admitting: Neurosurgery

## 2020-10-16 ENCOUNTER — Inpatient Hospital Stay: Admit: 2020-10-16 | Payer: No Typology Code available for payment source | Admitting: Neurosurgery

## 2020-10-16 SURGERY — POSTERIOR LUMBAR FUSION 1 LEVEL
Anesthesia: General

## 2020-10-28 ENCOUNTER — Encounter: Payer: Self-pay | Admitting: Internal Medicine

## 2020-11-16 ENCOUNTER — Other Ambulatory Visit: Payer: Self-pay | Admitting: Internal Medicine

## 2020-11-16 DIAGNOSIS — N529 Male erectile dysfunction, unspecified: Secondary | ICD-10-CM

## 2021-04-17 ENCOUNTER — Other Ambulatory Visit: Payer: Self-pay | Admitting: Internal Medicine

## 2021-04-17 DIAGNOSIS — N529 Male erectile dysfunction, unspecified: Secondary | ICD-10-CM

## 2021-04-17 MED ORDER — TADALAFIL 20 MG PO TABS: ORAL_TABLET | ORAL | 1 refills | Status: DC

## 2021-10-01 ENCOUNTER — Other Ambulatory Visit: Payer: Self-pay | Admitting: Internal Medicine

## 2021-10-01 DIAGNOSIS — N529 Male erectile dysfunction, unspecified: Secondary | ICD-10-CM

## 2022-01-25 ENCOUNTER — Other Ambulatory Visit: Payer: Self-pay | Admitting: Internal Medicine

## 2022-01-25 DIAGNOSIS — N529 Male erectile dysfunction, unspecified: Secondary | ICD-10-CM

## 2022-01-25 MED ORDER — TADALAFIL 20 MG PO TABS: ORAL_TABLET | ORAL | 1 refills | Status: DC

## 2022-01-27 ENCOUNTER — Other Ambulatory Visit: Payer: Self-pay | Admitting: Internal Medicine

## 2022-08-08 ENCOUNTER — Other Ambulatory Visit: Payer: Self-pay | Admitting: Internal Medicine

## 2022-08-08 DIAGNOSIS — N529 Male erectile dysfunction, unspecified: Secondary | ICD-10-CM

## 2022-08-08 MED ORDER — TADALAFIL 20 MG PO TABS: ORAL_TABLET | ORAL | 0 refills | Status: DC

## 2022-11-09 ENCOUNTER — Other Ambulatory Visit: Payer: Self-pay | Admitting: Internal Medicine

## 2022-11-09 DIAGNOSIS — N529 Male erectile dysfunction, unspecified: Secondary | ICD-10-CM

## 2023-08-11 ENCOUNTER — Encounter: Payer: Self-pay | Admitting: Gastroenterology

## 2023-08-24 ENCOUNTER — Ambulatory Visit (INDEPENDENT_AMBULATORY_CARE_PROVIDER_SITE_OTHER): Payer: No Typology Code available for payment source | Admitting: Gastroenterology

## 2023-08-24 ENCOUNTER — Encounter: Payer: Self-pay | Admitting: Gastroenterology

## 2023-08-24 VITALS — BP 118/78 | HR 90 | Ht 67.0 in | Wt 185.4 lb

## 2023-08-24 DIAGNOSIS — R112 Nausea with vomiting, unspecified: Secondary | ICD-10-CM | POA: Diagnosis not present

## 2023-08-24 DIAGNOSIS — R933 Abnormal findings on diagnostic imaging of other parts of digestive tract: Secondary | ICD-10-CM

## 2023-08-24 NOTE — Patient Instructions (Signed)
You have been scheduled for an endoscopy. Please follow written instructions given to you at your visit today.  If you use inhalers (even only as needed), please bring them with you on the day of your procedure.  If you take any of the following medications, they will need to be adjusted prior to your procedure:   DO NOT TAKE 7 DAYS PRIOR TO TEST- Trulicity (dulaglutide) Ozempic, Wegovy (semaglutide) Mounjaro (tirzepatide) Bydureon Bcise (exanatide extended release)  DO NOT TAKE 1 DAY PRIOR TO YOUR TEST Rybelsus (semaglutide) Adlyxin (lixisenatide) Victoza (liraglutide) Byetta (exanatide) ___________________________________________________________________________    _______________________________________________________  If your blood pressure at your visit was 140/90 or greater, please contact your primary care physician to follow up on this.  _______________________________________________________  If you are age 30 or older, your body mass index should be between 23-30. Your Body mass index is 29.03 kg/m. If this is out of the aforementioned range listed, please consider follow up with your Primary Care Provider.  If you are age 53 or younger, your body mass index should be between 19-25. Your Body mass index is 29.03 kg/m. If this is out of the aformentioned range listed, please consider follow up with your Primary Care Provider.   ________________________________________________________  The [facility] providers would like to encourage you to use [facility] to communicate with providers for non-urgent requests or questions.  Due to long hold times on the telephone, sending your provider a message by [facility] may be a faster and more efficient way to get a response.  Please allow 48 business hours for a response.  Please remember that this is for non-urgent requests.  _______________________________________________________ Thank you for trusting me with your gastrointestinal  care!   Alcide Evener, CRNP

## 2023-08-24 NOTE — Progress Notes (Signed)
[facility] Consult Note:  History: Omar [name] 08/24/2023  Referring provider: Lucky Cowboy, MD  Reason for consult/chief complaint: Emesis (Said the PA from the [place] said he had cyclic vomiting syndrome but patient thinks its something different. Was on ozepmic until 08-04-23. York Spaniel he can spend 12 hours on the bed with a trashcan but has a list....)   Subjective  Prior history:  [facility]a and vomiting, abnormal nonspecific small bowel findings on CT".   Patient noted to be on GLP-1 agonist No clinic notes, labs or imaging reports accompany the referral. [facility] found in care everywhere including the following:  GASTROENTEROLOGY CONSULT: LOCAL TITLE: GI CONSULT  STANDARD TITLE: GASTROENTEROLOGY CONSULT  DATE OF NOTE: Jul 29, 2023@13 :00 ENTRY DATE: Jul 29, 2023@13 :00:42  AUTHOR: MCDONALD,RYAN ANGUS EXP COSIGNER:  URGENCY: STATUS: COMPLETED   REASON FOR CONSULT: Abnormal findings on CT scan.  HPI: This is a 84 vet with PMH as below referred to GI as a consult regarding recent CT scan for vague left-sided upper abdominal pain. CT scan  shows diffuse areas of small bowel wall thickening sparing the ileum. Patient  denies any personal or family history of inflammatory bowel disease. States  left upper quadrant pain has resolved. His second complaint is 3 to 4-year  history of nausea and vomiting. Symptoms occur every 3 to 4 months last  approximately 14 hours and then resolved completely. He has not previously  sought evaluation for this from GI. His reflux symptoms are well-controlled  with esomeprazole. He takes semaglutide.  Vet denies any abdominal pain, nausea, vomiting, dyspepsia, dysphagia, diarrhea, constipation, hematochezia, melena, hemorrhoids, or change in BM's.  PREVIOUS ENDOSCOPY: 12/29/2020 colonoscopy performed at [facility] for screening. Anesthesia: MAC Findings 2  Sessile polyps 5 to 6mm in size in the rectum. Polypectomy performed  with Cold snare. Specimen submited, pathology pending. Otherwise normal exam to the cecum. DIAGNOSIS: FINAL DIAGNOSIS  Rectal polyp:  -- Tubular adenoma (2 fragments).  -- Hyperplastic polyp (1 fragments).  Two 5-6 mm rectal polyps- single adenoma. Repeat colonoscopy in 7 years.   Recent imaging: 06/13/2023 CT abdomen and pelvis with and without contrast for abdominal pain. Report: History: Abdominal pain   CT of the abdomen and pelvis was performed utilizing 100 mL of  Omnipaque 350 intravenously. Coronal and sagittal reformatted  images were obtained. Patient received positive oral contrast.   Comparison: Bladder ultrasound 08/19/2021.   Findings: Visualized lung bases demonstrate minimal atelectatic changes but otherwise unremarkable.   Unremarkable liver, spleen, and pancreas. Gallbladder appears  mildly contracted but there is no wall thickening or  pericholecystic stranding. No stones by CT.   Normal adrenal glands. Kidneys demonstrate symmetric contrast  enhancement and size with no hydronephrosis, stones, or enhancing  masses. There is a 3 mm cyst inferior pole left kidney and a 1 cm  cortical cyst medial right kidney. No stones in the ureters or  bladder. Bladder is unremarkable. Prostate gland is mildly  prominent measuring 4.8 cm in diameter. Prostatic calcifications  are present.   There are no enlarged lymph nodes in the abdomen or pelvis. No  ascites.   Scattered aortic atherosclerotic calcifications but no AAA with  distal AP diameter of 1.9 cm.   Stomach is decompressed. There is no bowel distention. Normal  appendix is identified and terminal ileum is unremarkable. There  is no small bowel dilatation or evidence for small bowel  obstruction, however there  appears to be fairly diffuse areas of  small bowel wall thickening of numerous small bowel loops with  sparing of the terminal  ileum. There is a moderate amount of  stool throughout the colon. There are a few diverticula involving  the sigmoid colon without evidence for acute diverticulitis.   Musculoskeletal: There is no acute or suspicious bony abnormality. There are a few scattered sclerotic bony lesions. Degenerative changes of bilateral hips and spine with marked disc space narrowing L5-S1 with grade 1 retrolisthesis.     Impression: 1. There appears to be fairly diffuse areas of small bowel wall  thickening which is nonspecific and may represent infectious or  inflammatory process. Correlate clinically and consider nonurgent  gastroenterology consult.   2. Otherwise no significant abnormality.   The assessment and plan section of that note indicates only community care referral for EGD.  ________________________________ No prior GI care in this health system.  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 He reports having had intermittent episodes of vomiting much of his life, usually triggered by a food or infectious trigger.  However, after starting Ozempic in the spring 2024, severe episodes were happening every couple of months that were becoming increasingly alarming.  He would get intense hot flash/nausea or prodrome with no time to get relief taking an oral medicine such as Zofran.  He would then get violent vomiting beginning within about 10 minutes, coming in waves and lasting 10 to 12 hours.  He would have prolonged recovery of 1 to 3 days where he needed rest and fluids and would have a sore throat afterward. The last few episodes occurred the same day he took his weekly Ozempic injection, and he stopped taking Ozempic on January 30.  Symptoms have since resolved. He did a lot of research about this and has determined that the Ozempic was the likely trigger for this.  Omar [name] also says he has a long history of IBS with  some irregularities of his bowel habits, and that this condition contribute to his discharge from the service. He is cautious about his diet and other potential triggers of that condition. ROS:  Review of Systems  Constitutional:  Negative for appetite change and unexpected weight change.  HENT:  Negative for mouth sores and voice change.   Eyes:  Negative for pain and redness.  Respiratory:  Negative for cough and shortness of breath.   Cardiovascular:  Negative for chest pain and palpitations.  Genitourinary:  Negative for dysuria and hematuria.  Musculoskeletal:  Positive for arthralgias and back pain. Negative for myalgias.  Skin:  Negative for pallor and rash.  Neurological:  Negative for weakness and headaches.  Hematological:  Negative for adenopathy.     Past Medical History: Past Medical History:  Diagnosis Date   Anal fissure    Arthritis    Complication of anesthesia    Pt states that he experienced some numbness on right side of tongue after surgery for 2 months; continues to experience gum sensitivity to date   COPD (chronic obstructive pulmonary disease) (HCC)    Depression    Diabetes mellitus without complication (HCC)    DJD (degenerative joint disease)    GERD (gastroesophageal reflux disease)    Headache(784.0)    Sinus headaches   Hyperlipidemia    Hypertension    PATIENT DENIES     Hypothyroidism    IBS (irritable bowel syndrome)    Other  testicular hypofunction    Prediabetes    Sleep apnea 2006   uses a cpap -desated to 55% during study, uses CPAP nightly   Thyroid disease    Tinnitus of both ears    Hx: of   Vitamin D deficiency      Past Surgical History: Past Surgical History:  Procedure Laterality Date   CARDIAC CATHETERIZATION  03/30/2010   normal coronaries, RCA takeoff of the left coronary cusp, EF 55% [facility])   CARPAL TUNNEL RELEASE Right 01/12/2016   Procedure: CARPAL TUNNEL RELEASE;  Surgeon: Jones Broom, MD;  Location:  [facility];  Service: Orthopedics;  Laterality: Right;  Right carpal tunnel release   COLONOSCOPY     x2   KNEE ARTHROPLASTY Left 10/16/2012   Procedure: LEFT COMPUTER ASSISTED TOTAL KNEE ARTHROPLASTY;  Surgeon: Harvie Junior, MD;  Location: [facility];  Service: Orthopedics;  Laterality: Left;   KNEE ARTHROSCOPY  05/12/2012   Procedure: ARTHROSCOPY KNEE;  Surgeon: Harvie Junior, MD;  Location: [facility];  Service: Orthopedics;  Laterality: Left;   KNEE SURGERY     MOUTH SURGERY     TOTAL KNEE ARTHROPLASTY Left 10/16/2012   Dr Luiz Blare   TOTAL KNEE ARTHROPLASTY Right 01/23/2018   Procedure: RIGHT TOTAL KNEE ARTHROPLASTY;  Surgeon: Ollen Gross, MD;  Location: [facility];  Service: Orthopedics;  Laterality: Right;   UPPER GI ENDOSCOPY     x2     Family History: Family History  Problem Relation Age of Onset   Cancer Mother        breast   Hyperlipidemia Mother    Hypertension Mother    Cancer Father        pancreatic    Social History: Social History   Socioeconomic History   Marital status: Married    Spouse name: Not on file   Number of children: Not on file   Years of education: Not on file   Highest education level: Not on file  Occupational History   Not on file  Tobacco Use   Smoking status: Former    Current packs/day: 0.00    Average packs/day: 1 pack/day for 40.0 years (40.0 ttl pk-yrs)    Types: Cigarettes    Start date: 04/14/1975    Quit date: 04/14/2015    Years since quitting: 8.3   Smokeless tobacco: Never   Tobacco comments:    "Vaping" ; NO VAPING   Substance and Sexual Activity   Alcohol use: No    Alcohol/week: 1.0 standard drink of alcohol    Types: 1 Standard drinks or equivalent per week    Comment: rarely   Drug use: No   Sexual activity: Yes  Other Topics Concern   Not on file  Social History Narrative   Not on file   Social Drivers of Health   Financial Resource Strain: Not on file  Food Insecurity: Not on file   Transportation Needs: Not on file  Physical Activity: Not on file  Stress: Not on file  Social Connections: Unknown (01/14/2023)   Received from [facility]   Social Network    Social Network: Not on file    Allergies: No Known Allergies  Outpatient Meds: Current Outpatient Medications  Medication Sig Dispense Refill   Alogliptin Benzoate 25 MG TABS Take 25 mg by mouth daily.     atorvastatin (LIPITOR) 20 MG tablet Take 60 mg by mouth daily.     Carboxymethylcellul-Glycerin (LUBRICATING EYE DROPS OP) Apply  1 drop to eye daily as needed (dry eyes).     Cholecalciferol (VITAMIN D3) 5000 units CAPS Take 5,000 Units by mouth every evening.     empagliflozin (JARDIANCE) 25 MG TABS tablet Take 12.5 mg by mouth daily.     esomeprazole (NEXIUM) 40 MG capsule Take 40 mg by mouth daily.     fenofibrate (TRICOR) 145 MG tablet Take 1 tablet (145 mg total) by mouth daily. 90 tablet 1   levothyroxine (SYNTHROID) 125 MCG tablet Take 125 mcg by mouth daily before breakfast.     loratadine (CLARITIN) 10 MG tablet Take 10 mg by mouth daily as needed for allergies.     metFORMIN (GLUCOPHAGE-XR) 500 MG 24 hr tablet TAKE 1 TABLET TWICE DAILY WITH BREAKFAST AND LUNCH, AND 2 TABLETS WITH SUPPER (Patient taking differently: Take 500 mg by mouth as directed. 1000mg  in the morning 500mg  in the afternoon) 60 tablet 11   Multiple Vitamin (MULTIVITAMIN WITH MINERALS) TABS tablet Take 1 tablet by mouth daily.     tadalafil (CIALIS) 10 MG tablet Take 5 mg by mouth as needed for erectile dysfunction.     tamsulosin (FLOMAX) 0.4 MG CAPS capsule Take 0.4 mg by mouth 2 (two) times daily.     testosterone cypionate (DEPO-TESTOSTERONE) 200 MG/ML injection Inject 2 mLs (400 mg total) into the muscle every 14 (fourteen) days. (Patient taking differently: Inject 100 mg into the muscle every 14 (fourteen) days.) 10 mL 5   No current facility-administered medications for this visit.       ___________________________________________________________________ Objective   Exam:  BP 118/78   Pulse 90   Ht 5\' 7"  (1.702 m)   Wt 185 lb 6 oz (84.1 kg)   BMI 29.03 kg/m  Wt Readings from Last 3 Encounters:  08/24/23 185 lb 6 oz (84.1 kg)  09/06/18 192 lb 12.8 oz (87.5 kg)  07/06/18 189 lb 6.4 oz (85.9 kg)    General: He is well-appearing, pleasant and conversational.  Ambulatory and gets on exam table independently. Eyes: sclera anicteric, no redness ENT: oral mucosa moist without lesions, no cervical or supraclavicular lymphadenopathy.  Good dentition CV: Regular without appreciable murmur, no JVD, no peripheral edema Resp: clear to auscultation bilaterally, normal RR and effort noted GI: soft, no tenderness, with active bowel sounds. No guarding or palpable organomegaly noted. Skin; warm and dry, no rash or jaundice noted Neuro: awake, alert and oriented x 3. Normal gross motor function and fluent speech   Labs: CT abdomen report noted above  Encounter Diagnoses  Name Primary?   Nausea and vomiting in adult Yes   Abnormal finding on GI tract imaging     Assessment and Plan It sounds like he has had lifelong tendencies to some intermittent vomiting with food or other infectious triggers, episodes that would be infrequent and probably indicate perhaps an underlying GI motility disorder, especially in the setting of reported IBS.  He then had severe and protracted side effects from taking a GLP-1 agonist that persisted until he recently stopped it.  Gastric outlet obstruction or ulcer, neoplasia or gastritis are possible but seems less likely given the reported history.  Significance of the reported nonspecific small bowel CT findings unknown, perhaps under distention or motility related imaging artifact.  Not typical for Crohn's disease. Since he is not planning to resume GLP-1 agonist medicine (and feels it probably will be necessary because he reports good  control of his diabetes on other current oral meds), and if symptoms not recur,  then this imaging finding will remain of questionable clinical significance.   He was agreeable to an upper endoscopy after discussion of procedure and risks.  The benefits and risks of the planned procedure were described in detail with the patient or (when appropriate) their health care proxy.  Risks were outlined as including, but not limited to, bleeding, infection, perforation, adverse medication reaction leading to cardiac or pulmonary decompensation, pancreatitis (if ERCP).  The limitation of incomplete mucosal visualization was also discussed.  No guarantees or warranties were given.  Thank you for the courtesy of this consult.  Please call me with any questions or concerns.  Charlie Pitter III  CC: Referring provider noted above

## 2023-09-23 ENCOUNTER — Ambulatory Visit: Payer: Non-veteran care | Admitting: Gastroenterology

## 2023-09-29 ENCOUNTER — Encounter: Payer: Self-pay | Admitting: Gastroenterology

## 2023-09-30 ENCOUNTER — Encounter: Payer: Self-pay | Admitting: Gastroenterology

## 2023-09-30 ENCOUNTER — Telehealth: Payer: Self-pay

## 2023-09-30 ENCOUNTER — Ambulatory Visit: Payer: No Typology Code available for payment source | Admitting: Gastroenterology

## 2023-09-30 VITALS — BP 114/63 | HR 83 | Temp 99.0°F | Resp 15 | Ht 67.0 in | Wt 185.0 lb

## 2023-09-30 DIAGNOSIS — K253 Acute gastric ulcer without hemorrhage or perforation: Secondary | ICD-10-CM

## 2023-09-30 DIAGNOSIS — K2950 Unspecified chronic gastritis without bleeding: Secondary | ICD-10-CM

## 2023-09-30 DIAGNOSIS — R933 Abnormal findings on diagnostic imaging of other parts of digestive tract: Secondary | ICD-10-CM

## 2023-09-30 DIAGNOSIS — K259 Gastric ulcer, unspecified as acute or chronic, without hemorrhage or perforation: Secondary | ICD-10-CM | POA: Diagnosis not present

## 2023-09-30 DIAGNOSIS — R112 Nausea with vomiting, unspecified: Secondary | ICD-10-CM

## 2023-09-30 DIAGNOSIS — K257 Chronic gastric ulcer without hemorrhage or perforation: Secondary | ICD-10-CM

## 2023-09-30 MED ORDER — SODIUM CHLORIDE 0.9 % IV SOLN: 500.0000 mL | Freq: Once | INTRAVENOUS | Status: DC

## 2023-09-30 NOTE — Progress Notes (Signed)
 Cell phone off per pt   Pt denies any history of hypertension

## 2023-09-30 NOTE — Op Note (Signed)
 [facility] Patient Name: Omar [name] Procedure Date: 09/30/2023 1:19 PM MRN: [id number] Endoscopist: Sherilyn Cooter L. Myrtie Neither , MD, [id number] Age: 68 Referring MD:  Date of Birth: 10-09-1955 Gender: Male Account #: [id number] Procedure:                Upper GI endoscopy Indications:              Abnormal CT of the GI tract, , Nausea with vomiting                           Clinical details in the recent office consult note Medicines:                Monitored Anesthesia Care Procedure:                Pre-Anesthesia Assessment:                           - Prior to the procedure, a History and Physical                            was performed, and patient medications and                            allergies were reviewed. The patient's tolerance of                            previous anesthesia was also reviewed. The risks                            and benefits of the procedure and the sedation                            options and risks were discussed with the patient.                            All questions were answered, and informed consent                            was obtained. Prior Anticoagulants: The patient has                            taken no anticoagulant or antiplatelet agents. ASA                            Grade Assessment: III - A patient with severe                            systemic disease. After reviewing the risks and                            benefits, the patient was deemed in satisfactory                            condition to undergo the procedure.  After obtaining informed consent, the endoscope was                            passed under direct vision. Throughout the                            procedure, the patient's blood pressure, pulse, and                            oxygen saturations were monitored continuously. The                            GIF [id number] #[id number] was introduced through the                             mouth, and advanced to the second part of duodenum.                            The upper GI endoscopy was accomplished without                            difficulty. The patient tolerated the procedure                            well. Scope In: Scope Out: Findings:                 The esophagus was normal.                           One non-bleeding superficial gastric ulcer with no                            stigmata of bleeding was found on the greater                            curvature of the gastric body. The lesion was 6 mm                            in largest dimension. Biopsies were taken with a                            cold forceps for histology (though the ulcer is                            benign-appearing). Jar 1                           Additional biopsies taken from the gastric antrum                            and body to rule out H. pylori (jar 2)                           The exam  of the stomach was otherwise normal,                            including on retroflexion. The stomach distended                            well with insufflation.                           The examined duodenum was normal. Complications:            No immediate complications. Estimated Blood Loss:     Estimated blood loss was minimal. Impression:               - Normal esophagus.                           - Non-bleeding gastric ulcer with no stigmata of                            bleeding. Biopsied. Questionable clinical                            significance.                           - Normal examined duodenum.                           This patient's nausea and vomiting appears to have                            been due to use of GLP-1 agonist medication, as                            symptoms resolved after he discontinued the                            medicine. Recommendation:           - Patient has a contact number available for                            emergencies. The  signs and symptoms of potential                            delayed complications were discussed with the                            patient. Return to normal activities tomorrow.                            Written discharge instructions were provided to the                            patient.                           -  Resume previous diet.                           - Continue present medications.                           - Await pathology results.                           - The patient will be questioned regarding use of                            aspirin and NSAIDs.                           He is already on esomeprazole.                           - [facility] Sherilyn Cooter L. Myrtie Neither, MD 09/30/2023 2:02:38 PM This report has been signed electronically.

## 2023-09-30 NOTE — Patient Instructions (Addendum)
-  await pathology results -Continue present medications     YOU HAD AN ENDOSCOPIC PROCEDURE TODAY AT THE [facility]:   Refer to the procedure report that was given to you for any specific questions about what was found during the examination.  If the procedure report does not answer your questions, please call your gastroenterologist to clarify.  If you requested that your care partner not be given the details of your procedure findings, then the procedure report has been included in a sealed envelope for you to review at your convenience later.  YOU SHOULD EXPECT: Some feelings of bloating in the abdomen. Passage of more gas than usual.  Walking can help get rid of the air that was put into your GI tract during the procedure and reduce the bloating. If you had a lower endoscopy (such as a colonoscopy or flexible sigmoidoscopy) you may notice spotting of blood in your stool or on the toilet paper. If you underwent a bowel prep for your procedure, you may not have a normal bowel movement for a few days.  Please Note:  You might notice some irritation and congestion in your nose or some drainage.  This is from the oxygen used during your procedure.  There is no need for concern and it should clear up in a day or so.  SYMPTOMS TO REPORT IMMEDIATELY:  Following upper endoscopy (EGD)  Vomiting of blood or coffee ground material  New chest pain or pain under the shoulder blades  Painful or persistently difficult swallowing  New shortness of breath  Fever of 100F or higher  Black, tarry-looking stools  For urgent or emergent issues, a gastroenterologist can be reached at any hour by calling [phone]. Do not use MyChart messaging for urgent concerns.    DIET:  We do recommend a small meal at first, but then you may proceed to your regular diet.  Drink plenty of fluids but you should avoid alcoholic beverages for 24 hours.  ACTIVITY:  You should plan to take it easy for the rest  of today and you should NOT DRIVE or use heavy machinery until tomorrow (because of the sedation medicines used during the test).    FOLLOW UP: Our staff will call the number listed on your records the next business day following your procedure.  We will call around 7:15- 8:00 am to check on you and address any questions or concerns that you may have regarding the information given to you following your procedure. If we do not reach you, we will leave a message.     If any biopsies were taken you will be contacted by phone or by letter within the next 1-3 weeks.  Please call us at [phone] if you have not heard about the biopsies in 3 weeks.    SIGNATURES/CONFIDENTIALITY: You and/or your care partner have signed paperwork which will be entered into your electronic medical record.  These signatures attest to the fact that that the information above on your After Visit Summary has been reviewed and is understood.  Full responsibility of the confidentiality of this discharge information lies with you and/or your care-partner.  

## 2023-09-30 NOTE — Progress Notes (Signed)
 Called to room to assist during endoscopic procedure.  Patient ID and intended procedure confirmed with present staff. Received instructions for my participation in the procedure from the performing physician.

## 2023-09-30 NOTE — Progress Notes (Signed)
 History and Physical:  This patient presents for endoscopic testing for: Encounter Diagnoses  Name Primary?   Nausea and vomiting in adult Yes   Abnormal finding on GI tract imaging     68 year old male here for endoscopic evaluation of nausea and vomiting.  Clinical details are in my office consult note dated 08/24/2023.  He has had no significant clinical changes since then.  Patient is otherwise without complaints or active issues today.   Past Medical History: Past Medical History:  Diagnosis Date   Anal fissure    Arthritis    Complication of anesthesia    Pt states that he experienced some numbness on right side of tongue after surgery for 2 months; continues to experience gum sensitivity to date   COPD (chronic obstructive pulmonary disease) (HCC)    Depression    Diabetes mellitus without complication (HCC)    DJD (degenerative joint disease)    GERD (gastroesophageal reflux disease)    Headache(784.0)    Sinus headaches   Hyperlipidemia    Hypertension    PATIENT DENIES 09-29-23   Hypothyroidism    IBS (irritable bowel syndrome)    Other testicular hypofunction    Prediabetes    Sleep apnea 2006   uses a cpap -desated to 55% during study, uses CPAP nightly   Thyroid disease    Tinnitus of both ears    Hx: of   Vitamin D deficiency      Past Surgical History: Past Surgical History:  Procedure Laterality Date   CARDIAC CATHETERIZATION  03/30/2010   normal coronaries, RCA takeoff of the left coronary cusp, EF 55% [facility])   CARPAL TUNNEL RELEASE Right 01/12/2016   Procedure: CARPAL TUNNEL RELEASE;  Surgeon: Jones Broom, MD;  Location: [facility];  Service: Orthopedics;  Laterality: Right;  Right carpal tunnel release   COLONOSCOPY     x2   KNEE ARTHROPLASTY Left 10/16/2012   Procedure: LEFT COMPUTER ASSISTED TOTAL KNEE ARTHROPLASTY;  Surgeon: Harvie Junior, MD;  Location: [facility] OR;  Service: Orthopedics;  Laterality: Left;   KNEE  ARTHROSCOPY  05/12/2012   Procedure: ARTHROSCOPY KNEE;  Surgeon: Harvie Junior, MD;  Location:  SURGERY CENTER;  Service: Orthopedics;  Laterality: Left;   KNEE SURGERY     MOUTH SURGERY     TOTAL KNEE ARTHROPLASTY Left 10/16/2012   Dr Luiz Blare   TOTAL KNEE ARTHROPLASTY Right 01/23/2018   Procedure: RIGHT TOTAL KNEE ARTHROPLASTY;  Surgeon: Ollen Gross, MD;  Location: [facility];  Service: Orthopedics;  Laterality: Right;   UPPER GI ENDOSCOPY     x2    Allergies: Allergies  Allergen Reactions   Ozempic (0.25 Or 0.5 Mg-Dose) [Semaglutide(0.25 Or 0.5mg -Dos)] Nausea Only    Outpatient Meds: Current Outpatient Medications  Medication Sig Dispense Refill   Alogliptin Benzoate 25 MG TABS Take 25 mg by mouth daily.     atorvastatin (LIPITOR) 20 MG tablet Take 60 mg by mouth daily.     Carboxymethylcellul-Glycerin (LUBRICATING EYE DROPS OP) Apply 1 drop to eye daily as needed (dry eyes).     Cholecalciferol (VITAMIN D3) 5000 units CAPS Take 5,000 Units by mouth every evening.     empagliflozin (JARDIANCE) 25 MG TABS tablet Take 12.5 mg by mouth daily.     esomeprazole (NEXIUM) 40 MG capsule Take 40 mg by mouth daily.     levothyroxine (SYNTHROID) 125 MCG tablet Take 125 mcg by mouth daily before breakfast.     loratadine (CLARITIN) 10 MG  tablet Take 10 mg by mouth daily as needed for allergies.     metFORMIN (GLUCOPHAGE-XR) 500 MG 24 hr tablet TAKE 1 TABLET TWICE DAILY WITH BREAKFAST AND LUNCH, AND 2 TABLETS WITH SUPPER (Patient taking differently: Take 500 mg by mouth as directed. 1000mg  in the morning 500mg  in the afternoon) 60 tablet 11   Multiple Vitamin (MULTIVITAMIN WITH MINERALS) TABS tablet Take 1 tablet by mouth daily.     tadalafil (CIALIS) 10 MG tablet Take 5 mg by mouth as needed for erectile dysfunction.     tamsulosin (FLOMAX) 0.4 MG CAPS capsule Take 0.4 mg by mouth 2 (two) times daily.     fenofibrate (TRICOR) 145 MG tablet Take 1 tablet (145 mg total) by mouth daily. 90  tablet 1   testosterone cypionate (DEPO-TESTOSTERONE) 200 MG/ML injection Inject 2 mLs (400 mg total) into the muscle every 14 (fourteen) days. (Patient taking differently: Inject 100 mg into the muscle every 14 (fourteen) days. 1 mL with each dose) 10 mL 5   Current Facility-Administered Medications  Medication Dose Route Frequency Provider Last Rate Last Admin   0.9 %  sodium chloride infusion  500 mL Intravenous Once Sherrilyn Rist, MD          ___________________________________________________________________ Objective   Exam:  BP (!) 131/98   Pulse 86   Temp 99 F (37.2 C)   Resp 10   Ht 5\' 7"  (1.702 m)   Wt 185 lb (83.9 kg)   SpO2 97%   BMI 28.98 kg/m   CV: regular , S1/S2 Resp: clear to auscultation bilaterally, normal RR and effort noted GI: soft, no tenderness, with active bowel sounds.   Assessment: Encounter Diagnoses  Name Primary?   Nausea and vomiting in adult Yes   Abnormal finding on GI tract imaging      Plan: EGD  The benefits and risks of the planned procedure(s) were described in detail with the patient or (when appropriate) their health care proxy.  Risks were outlined as including, but not limited to, bleeding, infection, perforation, adverse medication reaction leading to cardiac or pulmonary decompensation, pancreatitis (if ERCP).  The limitation of incomplete mucosal visualization was also discussed.  No guarantees or warranties were given.  The patient is appropriate for an endoscopic procedure in the ambulatory setting.   - Amada Jupiter, MD

## 2023-09-30 NOTE — Telephone Encounter (Signed)
 See Patient Message from yesterday evening. Called the patient. He found his instructions. He does not have any questions at this time.

## 2023-09-30 NOTE — Progress Notes (Signed)
Vssmnad trans to pacu 

## 2023-10-03 ENCOUNTER — Telehealth: Payer: Self-pay

## 2023-10-03 NOTE — Telephone Encounter (Signed)
 Left message on follow up call.

## 2023-10-05 ENCOUNTER — Encounter: Payer: Self-pay | Admitting: Gastroenterology

## 2023-10-05 LAB — SURGICAL PATHOLOGY
